# Patient Record
Sex: Female | Born: 1965
Health system: Southern US, Community
[De-identification: ages and names within clinical notes are randomized; demographics above are authoritative.]

## PROBLEM LIST (undated history)

## (undated) DIAGNOSIS — E785 Hyperlipidemia, unspecified: Secondary | ICD-10-CM

## (undated) DIAGNOSIS — Z98811 Dental restoration status: Secondary | ICD-10-CM

## (undated) DIAGNOSIS — M26629 Arthralgia of temporomandibular joint, unspecified side: Secondary | ICD-10-CM

## (undated) DIAGNOSIS — L409 Psoriasis, unspecified: Secondary | ICD-10-CM

## (undated) DIAGNOSIS — Z9889 Other specified postprocedural states: Secondary | ICD-10-CM

## (undated) DIAGNOSIS — M1990 Unspecified osteoarthritis, unspecified site: Secondary | ICD-10-CM

## (undated) DIAGNOSIS — K529 Noninfective gastroenteritis and colitis, unspecified: Secondary | ICD-10-CM

## (undated) DIAGNOSIS — J343 Hypertrophy of nasal turbinates: Secondary | ICD-10-CM

## (undated) DIAGNOSIS — Z800 Family history of malignant neoplasm of digestive organs: Secondary | ICD-10-CM

## (undated) DIAGNOSIS — R112 Nausea with vomiting, unspecified: Secondary | ICD-10-CM

## (undated) DIAGNOSIS — I341 Nonrheumatic mitral (valve) prolapse: Secondary | ICD-10-CM

## (undated) DIAGNOSIS — I4891 Unspecified atrial fibrillation: Secondary | ICD-10-CM

## (undated) DIAGNOSIS — N200 Calculus of kidney: Secondary | ICD-10-CM

## (undated) DIAGNOSIS — G43909 Migraine, unspecified, not intractable, without status migrainosus: Secondary | ICD-10-CM

## (undated) DIAGNOSIS — K219 Gastro-esophageal reflux disease without esophagitis: Secondary | ICD-10-CM

## (undated) DIAGNOSIS — E039 Hypothyroidism, unspecified: Secondary | ICD-10-CM

## (undated) HISTORY — DX: Nonrheumatic mitral (valve) prolapse: I34.1

## (undated) HISTORY — PX: ESOPHAGOGASTRODUODENOSCOPY: SHX1529

## (undated) HISTORY — PX: TRIGEMINAL NERVE DECOMPRESSION: SHX2579

## (undated) HISTORY — PX: TMJ ARTHROPLASTY: SHX1066

## (undated) HISTORY — PX: CHOLECYSTECTOMY: SHX55

## (undated) HISTORY — PX: ABDOMINAL HYSTERECTOMY: SHX81

## (undated) HISTORY — DX: Hyperlipidemia, unspecified: E78.5

## (undated) HISTORY — DX: Calculus of kidney: N20.0

## (undated) HISTORY — DX: Unspecified atrial fibrillation: I48.91

## (undated) HISTORY — PX: COLONOSCOPY: SHX174

## (undated) HISTORY — DX: Family history of malignant neoplasm of digestive organs: Z80.0

## (undated) HISTORY — DX: Gastro-esophageal reflux disease without esophagitis: K21.9

---
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
## 2010-06-19 ENCOUNTER — Encounter
Admission: RE | Admit: 2010-06-19 | Discharge: 2010-06-19 | Payer: Self-pay | Source: Home / Self Care | Attending: Obstetrics and Gynecology | Admitting: Obstetrics and Gynecology

## 2011-05-26 ENCOUNTER — Other Ambulatory Visit: Payer: Self-pay | Admitting: Obstetrics and Gynecology

## 2011-05-26 DIAGNOSIS — Z1231 Encounter for screening mammogram for malignant neoplasm of breast: Secondary | ICD-10-CM

## 2011-05-30 DIAGNOSIS — I341 Nonrheumatic mitral (valve) prolapse: Secondary | ICD-10-CM | POA: Insufficient documentation

## 2011-06-25 ENCOUNTER — Ambulatory Visit: Payer: Self-pay

## 2011-07-16 ENCOUNTER — Ambulatory Visit: Payer: Self-pay

## 2012-10-30 DIAGNOSIS — M67919 Unspecified disorder of synovium and tendon, unspecified shoulder: Secondary | ICD-10-CM | POA: Insufficient documentation

## 2012-10-30 DIAGNOSIS — M25519 Pain in unspecified shoulder: Secondary | ICD-10-CM | POA: Insufficient documentation

## 2012-12-09 ENCOUNTER — Ambulatory Visit: Payer: BC Managed Care – PPO

## 2012-12-09 ENCOUNTER — Ambulatory Visit (INDEPENDENT_AMBULATORY_CARE_PROVIDER_SITE_OTHER): Payer: BC Managed Care – PPO | Admitting: Emergency Medicine

## 2012-12-09 VITALS — BP 138/94 | HR 67 | Temp 97.8°F | Resp 16 | Ht 64.0 in | Wt 128.0 lb

## 2012-12-09 DIAGNOSIS — IMO0002 Reserved for concepts with insufficient information to code with codable children: Secondary | ICD-10-CM

## 2012-12-09 DIAGNOSIS — M79644 Pain in right finger(s): Secondary | ICD-10-CM

## 2012-12-09 DIAGNOSIS — M79609 Pain in unspecified limb: Secondary | ICD-10-CM

## 2012-12-09 NOTE — Progress Notes (Signed)
  Subjective:    Patient ID: Christine Hendrix, female    DOB: [birth year], 47 y.o.   MRN: [id number]  HPI Larey Seat on Sunday 12/05/12.  While tackling dog onto right hand and injured ring finger.  Now finger has changed colored.  Feels likes pins and needles.  When she uses it the darker the color gets.  It is also swollen up into the knuckle.  Cannot bend finger.      Review of Systems     Objective:   Physical Exam There is diffuse ecchymoses involving the right fourth finger she has limited flexion at the PIP joint and DIP joint. There is no instability noted. [facility] reading (PRIMARY) by  Dr. Cleta Alberts there is a volar plate fracture at the base of the middle phalanx of the fourth finger        Assessment & Plan:   The patient will be buddy taped . she is to start exercises in approximately 7-10 days .

## 2012-12-13 ENCOUNTER — Telehealth: Payer: Self-pay

## 2012-12-13 NOTE — Telephone Encounter (Signed)
Patient is calling to ask a question about the way we have taped up her fx finger, and her fingers turning blue please call her at [phone]

## 2012-12-15 NOTE — Telephone Encounter (Signed)
lmom to cb.  Pt needs to probably loosen up the wrap

## 2012-12-15 NOTE — Telephone Encounter (Signed)
She has been removing the buddy wrap on a regular basis. She states the buddy wrap is not too tight when she applies. She states that it turns blue when she uses her hand- the fingers still have a "pins and needles" feeling. She states that it hurts. She acknowledges that she has been using it more than she should. She is going to use regular medical tape instead of the coban. She will call us if she continues to have problems.

## 2012-12-16 NOTE — Telephone Encounter (Signed)
Finger needs to be loosely wrapped.Re Xray next week.

## 2012-12-16 NOTE — Telephone Encounter (Signed)
Called her, advised.

## 2013-01-17 ENCOUNTER — Telehealth: Payer: Self-pay

## 2013-01-17 NOTE — Telephone Encounter (Signed)
Thanks. Request given to xray

## 2013-01-17 NOTE — Telephone Encounter (Signed)
PT HAVE AN APPT WITH ANOTHER DR ON Thursday AND NEED TO COME BY AND PICK UP A COPY OF THE XRAYS SHE HAD . PLEASE CALL [phone] WHEN READY

## 2013-09-02 DIAGNOSIS — G2581 Restless legs syndrome: Secondary | ICD-10-CM | POA: Insufficient documentation

## 2013-09-02 DIAGNOSIS — F341 Dysthymic disorder: Secondary | ICD-10-CM | POA: Insufficient documentation

## 2013-09-02 DIAGNOSIS — E039 Hypothyroidism, unspecified: Secondary | ICD-10-CM | POA: Insufficient documentation

## 2013-09-02 DIAGNOSIS — G4700 Insomnia, unspecified: Secondary | ICD-10-CM | POA: Insufficient documentation

## 2013-09-02 DIAGNOSIS — IMO0001 Reserved for inherently not codable concepts without codable children: Secondary | ICD-10-CM | POA: Insufficient documentation

## 2013-09-02 DIAGNOSIS — M542 Cervicalgia: Secondary | ICD-10-CM | POA: Insufficient documentation

## 2013-09-02 DIAGNOSIS — G4733 Obstructive sleep apnea (adult) (pediatric): Secondary | ICD-10-CM | POA: Insufficient documentation

## 2013-09-02 DIAGNOSIS — K929 Disease of digestive system, unspecified: Secondary | ICD-10-CM | POA: Insufficient documentation

## 2013-09-02 DIAGNOSIS — I499 Cardiac arrhythmia, unspecified: Secondary | ICD-10-CM | POA: Insufficient documentation

## 2013-09-02 DIAGNOSIS — R413 Other amnesia: Secondary | ICD-10-CM | POA: Insufficient documentation

## 2013-10-27 ENCOUNTER — Ambulatory Visit: Payer: BC Managed Care – PPO | Admitting: Podiatry

## 2013-11-21 ENCOUNTER — Encounter: Payer: Self-pay | Admitting: Podiatry

## 2013-11-21 ENCOUNTER — Ambulatory Visit (INDEPENDENT_AMBULATORY_CARE_PROVIDER_SITE_OTHER): Payer: BC Managed Care – PPO

## 2013-11-21 ENCOUNTER — Ambulatory Visit (INDEPENDENT_AMBULATORY_CARE_PROVIDER_SITE_OTHER): Payer: BC Managed Care – PPO | Admitting: Podiatry

## 2013-11-21 VITALS — BP 133/85 | HR 69 | Resp 16 | Ht 64.0 in | Wt 127.0 lb

## 2013-11-21 DIAGNOSIS — M2040 Other hammer toe(s) (acquired), unspecified foot: Secondary | ICD-10-CM

## 2013-11-21 NOTE — Progress Notes (Signed)
Subjective:     Patient ID: Christine Hendrix, female   DOB: 08/03/1965, 48 y.o.   MRN: [id number]  Toe Pain    patient presents stating I do a lot of rotation of the fourth and fifth toes of both my feet and they're starting to bother me when I walk on them and I feel like I am not walking normally   Review of Systems  All other systems reviewed and are negative.      Objective:   Physical Exam  Nursing note and vitals reviewed. Constitutional: She is oriented to person, place, and time.  Cardiovascular: Intact distal pulses.   Musculoskeletal: Normal range of motion.  Neurological: She is oriented to person, place, and time.  Skin: Skin is warm.   Neurovascular status intact with severe rotation of the fourth and fifth toes of both feet with distal lateral keratotic irritative tissue. 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 Patient would like to have these fixed but is not sure and timing and are reviewed distal arthroplasty of the fourth and fifth toes of both feet and all risk associated with this. She will be seen prior to surgery

## 2013-11-21 NOTE — Progress Notes (Signed)
   Subjective:    Patient ID: Christine Hendrix, female    DOB: 12/01/1965, [age] y.o.   MRN: [id number]  HPI Comments: "I have these toes that turn in"  Patient c/o aching 4th and 5th toes bilateral for several years. She feels that she is walking on the toes and they turn inward. Trouble walking.  Toe Pain       Review of Systems  Constitutional: Positive for fatigue.  Cardiovascular: Positive for palpitations.  Endocrine: Positive for heat intolerance.  Musculoskeletal: Positive for arthralgias.  Neurological: Positive for headaches.  All other systems reviewed and are negative.      Objective:   Physical Exam        Assessment & Plan:

## 2013-12-14 ENCOUNTER — Encounter: Payer: Self-pay | Admitting: Podiatry

## 2013-12-14 ENCOUNTER — Ambulatory Visit (INDEPENDENT_AMBULATORY_CARE_PROVIDER_SITE_OTHER): Payer: BC Managed Care – PPO | Admitting: Podiatry

## 2013-12-14 ENCOUNTER — Telehealth: Payer: Self-pay | Admitting: *Deleted

## 2013-12-14 DIAGNOSIS — M2040 Other hammer toe(s) (acquired), unspecified foot: Secondary | ICD-10-CM

## 2013-12-14 DIAGNOSIS — B351 Tinea unguium: Secondary | ICD-10-CM

## 2013-12-14 NOTE — Telephone Encounter (Signed)
Patient had called and stated she had left without a prescription for toenails, please call.  I called and informed the patient that Dr. Charlsie Merlesegal wants her to use Formula 3.  We sale it here at the office.  She stated I live way across town and he failed to tell me this.  I apologized to the patient, told her I was sorry. She stated okay bye!

## 2013-12-15 NOTE — Progress Notes (Signed)
Subjective:     Patient ID: Christine Hendrix, female   DOB: 01/02/1966, 48 y.o.   MRN: [id number]  HPI patient states that the fourth and fifth toes on her right foot have turned yellow after she returned from the beach and she is scared of fungus   Review of Systems     Objective:   Physical Exam Neurovascular status intact with severe rotation of the fourth and fifth toes of both feet with what appears to be traumatized fourth and fifth nails right with possible low grade fungal infection    Assessment:     Probable infection secondary to trauma with possible mycotic infiltration    Plan:     Reviewed condition of the toes is part of this problem and went ahead today and started her on topical antifungal with explaining that we'll take several months to grow out and should not spread. Spread occur she is to let us know immediately

## 2013-12-16 ENCOUNTER — Other Ambulatory Visit: Payer: Self-pay | Admitting: *Deleted

## 2013-12-16 DIAGNOSIS — Z79899 Other long term (current) drug therapy: Secondary | ICD-10-CM

## 2013-12-16 DIAGNOSIS — B351 Tinea unguium: Secondary | ICD-10-CM

## 2013-12-16 NOTE — Progress Notes (Signed)
Patient presents to the office today to pick up Formula 3 for her 4th and 5th toenail right. Patient was concerned that the Formula 3 was not going to be enough to treat. She states that Dr. Charlsie Merlesegal discussed with her that she could try Lamisil. She would like to do this. I gave patient hepatic panel requisition to get bloodwork done and will contact her with results.

## 2014-02-24 DIAGNOSIS — G43711 Chronic migraine without aura, intractable, with status migrainosus: Secondary | ICD-10-CM | POA: Insufficient documentation

## 2014-08-25 ENCOUNTER — Ambulatory Visit (INDEPENDENT_AMBULATORY_CARE_PROVIDER_SITE_OTHER): Payer: BLUE CROSS/BLUE SHIELD | Admitting: Podiatry

## 2014-08-25 ENCOUNTER — Telehealth: Payer: Self-pay | Admitting: *Deleted

## 2014-08-25 ENCOUNTER — Encounter: Payer: Self-pay | Admitting: Podiatry

## 2014-08-25 VITALS — BP 136/90 | HR 73 | Ht 64.0 in | Wt 127.0 lb

## 2014-08-25 DIAGNOSIS — M216X9 Other acquired deformities of unspecified foot: Secondary | ICD-10-CM | POA: Insufficient documentation

## 2014-08-25 DIAGNOSIS — M722 Plantar fascial fibromatosis: Secondary | ICD-10-CM | POA: Diagnosis not present

## 2014-08-25 DIAGNOSIS — M2041 Other hammer toe(s) (acquired), right foot: Secondary | ICD-10-CM | POA: Diagnosis not present

## 2014-08-25 DIAGNOSIS — M21969 Unspecified acquired deformity of unspecified lower leg: Secondary | ICD-10-CM | POA: Insufficient documentation

## 2014-08-25 DIAGNOSIS — M2042 Other hammer toe(s) (acquired), left foot: Secondary | ICD-10-CM

## 2014-08-25 NOTE — Telephone Encounter (Signed)
08/25/2014  Dr. Raynald KempSheard, patient called and stated the arch binders have irritated her ever since she wore them this morning.I told her I would ask you and call her back before the end of the day.

## 2014-08-25 NOTE — Progress Notes (Signed)
Subjective: 49 year old female presents complaining of toes are curling more and afraid of foot surgery.   Objective: Hypermobile first ray bilateral. HAV bilateral. Long and contracted 4th digits bilateral. Painful right heel.  Assessment: Hypermobile first ray with hyperpronation bilateral. 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 Plantar fasciitis right. Ligamentous laxity lower limbs.  Plan: Reviewed clinical findings and available treatment options. Metatarsal binder dispensed x 2. Need custom orthotics. Reviewed Lapidus fusion procedure.

## 2014-08-25 NOTE — Patient Instructions (Signed)
Seen for right heel pain and contracted 4th digits bilateral. Reviewed findings. Noted of weak first ray bilateral. May benefit from Metatarsal binder (d, Custom orthotics, and possible Lapidus fusion procedure (Fusion of the first Metatarso-cuneiform joint).

## 2014-09-11 ENCOUNTER — Ambulatory Visit: Payer: BLUE CROSS/BLUE SHIELD | Admitting: Podiatry

## 2015-05-06 DIAGNOSIS — N3946 Mixed incontinence: Secondary | ICD-10-CM | POA: Insufficient documentation

## 2015-05-06 DIAGNOSIS — R350 Frequency of micturition: Secondary | ICD-10-CM | POA: Insufficient documentation

## 2015-05-06 DIAGNOSIS — R319 Hematuria, unspecified: Secondary | ICD-10-CM | POA: Insufficient documentation

## 2015-05-15 DIAGNOSIS — E278 Other specified disorders of adrenal gland: Secondary | ICD-10-CM | POA: Insufficient documentation

## 2015-06-07 DIAGNOSIS — G43909 Migraine, unspecified, not intractable, without status migrainosus: Secondary | ICD-10-CM | POA: Insufficient documentation

## 2015-06-21 ENCOUNTER — Encounter (HOSPITAL_BASED_OUTPATIENT_CLINIC_OR_DEPARTMENT_OTHER): Payer: Self-pay | Admitting: *Deleted

## 2015-06-21 ENCOUNTER — Emergency Department (HOSPITAL_BASED_OUTPATIENT_CLINIC_OR_DEPARTMENT_OTHER)
Admission: EM | Admit: 2015-06-21 | Discharge: 2015-06-21 | Disposition: A | Discharge status: Home / Self Care | Payer: BLUE CROSS/BLUE SHIELD | Attending: Emergency Medicine | Admitting: Emergency Medicine

## 2015-06-21 ENCOUNTER — Emergency Department (HOSPITAL_BASED_OUTPATIENT_CLINIC_OR_DEPARTMENT_OTHER): Payer: BLUE CROSS/BLUE SHIELD

## 2015-06-21 DIAGNOSIS — Z79899 Other long term (current) drug therapy: Secondary | ICD-10-CM | POA: Insufficient documentation

## 2015-06-21 DIAGNOSIS — E079 Disorder of thyroid, unspecified: Secondary | ICD-10-CM | POA: Insufficient documentation

## 2015-06-21 DIAGNOSIS — Z8679 Personal history of other diseases of the circulatory system: Secondary | ICD-10-CM | POA: Insufficient documentation

## 2015-06-21 DIAGNOSIS — N23 Unspecified renal colic: Secondary | ICD-10-CM | POA: Insufficient documentation

## 2015-06-21 DIAGNOSIS — Z9049 Acquired absence of other specified parts of digestive tract: Secondary | ICD-10-CM | POA: Diagnosis not present

## 2015-06-21 DIAGNOSIS — Z90710 Acquired absence of both cervix and uterus: Secondary | ICD-10-CM | POA: Diagnosis not present

## 2015-06-21 DIAGNOSIS — R1032 Left lower quadrant pain: Secondary | ICD-10-CM | POA: Diagnosis present

## 2015-06-21 LAB — CBC WITH DIFFERENTIAL/PLATELET
BASOS PCT: 0 %
Basophils Absolute: 0 10*3/uL (ref 0.0–0.1)
Eosinophils Absolute: 0 10*3/uL (ref 0.0–0.7)
Eosinophils Relative: 0 %
HEMATOCRIT: 40.2 % (ref 36.0–46.0)
HEMOGLOBIN: 13.2 g/dL (ref 12.0–15.0)
LYMPHS ABS: 3.5 10*3/uL (ref 0.7–4.0)
Lymphocytes Relative: 36 %
MCH: 28.3 pg (ref 26.0–34.0)
MCHC: 32.8 g/dL (ref 30.0–36.0)
MCV: 86.1 fL (ref 78.0–100.0)
MONO ABS: 1 10*3/uL (ref 0.1–1.0)
MONOS PCT: 10 %
NEUTROS ABS: 5.2 10*3/uL (ref 1.7–7.7)
NEUTROS PCT: 54 %
Platelets: 294 10*3/uL (ref 150–400)
RBC: 4.67 MIL/uL (ref 3.87–5.11)
RDW: 13 % (ref 11.5–15.5)
WBC: 9.8 10*3/uL (ref 4.0–10.5)

## 2015-06-21 LAB — URINALYSIS, ROUTINE W REFLEX MICROSCOPIC
Glucose, UA: NEGATIVE mg/dL
KETONES UR: 15 mg/dL — AB
Nitrite: POSITIVE — AB
PROTEIN: 30 mg/dL — AB
Specific Gravity, Urine: 1.031 — ABNORMAL HIGH (ref 1.005–1.030)
pH: 6 (ref 5.0–8.0)

## 2015-06-21 LAB — URINE MICROSCOPIC-ADD ON

## 2015-06-21 LAB — BASIC METABOLIC PANEL
ANION GAP: 13 (ref 5–15)
BUN: 16 mg/dL (ref 6–20)
CALCIUM: 9 mg/dL (ref 8.9–10.3)
CHLORIDE: 104 mmol/L (ref 101–111)
CO2: 23 mmol/L (ref 22–32)
Creatinine, Ser: 1.03 mg/dL — ABNORMAL HIGH (ref 0.44–1.00)
GFR calc Af Amer: >60 mL/min (ref >60)
GFR calc non Af Amer: >60 mL/min (ref >60)
GLUCOSE: 140 mg/dL — AB (ref 65–99)
Potassium: 3.1 mmol/L — ABNORMAL LOW (ref 3.5–5.1)
Sodium: 140 mmol/L (ref 135–145)

## 2015-06-21 MED ORDER — OXYCODONE-ACETAMINOPHEN 5-325 MG PO TABS: 1.0000 | ORAL_TABLET | Freq: Four times a day (QID) | ORAL | Status: DC | PRN

## 2015-06-21 MED ORDER — SODIUM CHLORIDE 0.9 % IV BOLUS (SEPSIS)
1000.0000 mL | Freq: Once | INTRAVENOUS | Status: AC
  Administered 2015-06-21: 1000 mL via INTRAVENOUS

## 2015-06-21 MED ORDER — ONDANSETRON HCL 4 MG/2ML IJ SOLN
4.0000 mg | Freq: Once | INTRAMUSCULAR | Status: AC
  Administered 2015-06-21: 4 mg via INTRAVENOUS
  Filled 2015-06-21: qty 2

## 2015-06-21 MED ORDER — KETOROLAC TROMETHAMINE 15 MG/ML IJ SOLN
15.0000 mg | Freq: Once | INTRAMUSCULAR | Status: AC
  Administered 2015-06-21: 15 mg via INTRAVENOUS
  Filled 2015-06-21: qty 1

## 2015-06-21 MED ORDER — TAMSULOSIN HCL 0.4 MG PO CAPS: 0.4000 mg | ORAL_CAPSULE | Freq: Every day | ORAL | Status: DC

## 2015-06-21 MED ORDER — POTASSIUM CHLORIDE CRYS ER 20 MEQ PO TBCR
20.0000 meq | EXTENDED_RELEASE_TABLET | Freq: Once | ORAL | Status: AC
  Administered 2015-06-21: 20 meq via ORAL
  Filled 2015-06-21: qty 1

## 2015-06-21 MED ORDER — ONDANSETRON HCL 4 MG PO TABS: 4.0000 mg | ORAL_TABLET | Freq: Four times a day (QID) | ORAL | Status: DC

## 2015-06-21 MED FILL — OXYCODONE/APAP 5-325: 5-325 | 3 days supply | Qty: 10 | Fill #0

## 2015-06-21 MED FILL — TAMSULOSIN HCL 0.4 MG CAP: 0.4 | 14 days supply | Qty: 14 | Fill #0

## 2015-06-21 MED FILL — ONDANSETRON HCL 4 MG TABLET: 4 | 3 days supply | Qty: 9 | Fill #0

## 2015-06-21 NOTE — ED Notes (Signed)
Lower abdominal pain, worse on the left side.

## 2015-06-21 NOTE — Discharge Instructions (Signed)
You have a 3mm kidney stone on the left side.     Kidney Stones Kidney stones (urolithiasis) are deposits that form inside your kidneys. The intense pain is caused by the stone moving through the urinary tract. When the stone moves, the ureter goes into spasm around the stone. The stone is usually passed in the urine.  CAUSES   A disorder that makes certain neck glands produce too much parathyroid hormone (primary hyperparathyroidism).  A buildup of uric acid crystals, similar to gout in your joints.  Narrowing (stricture) of the ureter.  A kidney obstruction present at birth (congenital obstruction).  Previous surgery on the kidney or ureters.  Numerous kidney infections. SYMPTOMS   Feeling sick to your stomach (nauseous).  Throwing up (vomiting).  Blood in the urine (hematuria).  Pain that usually spreads (radiates) to the groin.  Frequency or urgency of urination. DIAGNOSIS   Taking a history and physical exam.  Blood or urine tests.  CT scan.  Occasionally, an examination of the inside of the urinary bladder (cystoscopy) is performed. TREATMENT   Observation.  Increasing your fluid intake.  Extracorporeal shock wave lithotripsy--This is a noninvasive procedure that uses shock waves to break up kidney stones.  Surgery may be needed if you have severe pain or persistent obstruction. There are various surgical procedures. Most of the procedures are performed with the use of small instruments. Only small incisions are needed to accommodate these instruments, so recovery time is minimized. The size, location, and chemical composition are all important variables that will determine the proper choice of action for you. Talk to your health care provider to better understand your situation so that you will minimize the risk of injury to yourself and your kidney.  HOME CARE INSTRUCTIONS   Drink enough water and fluids to keep your urine clear or pale yellow. This will help  you to pass the stone or stone fragments.  Strain all urine through the provided strainer. Keep all particulate matter and stones for your health care provider to see. The stone causing the pain may be as small as a grain of salt. It is very important to use the strainer each and every time you pass your urine. The collection of your stone will allow your health care provider to analyze it and verify that a stone has actually passed. The stone analysis will often identify what you can do to reduce the incidence of recurrences.  Only take over-the-counter or prescription medicines for pain, discomfort, or fever as directed by your health care provider.  Keep all follow-up visits as told by your health care provider. This is important.  Get follow-up X-rays if required. The absence of pain does not always mean that the stone has passed. It may have only stopped moving. If the urine remains completely obstructed, it can cause loss of kidney function or even complete destruction of the kidney. It is your responsibility to make sure X-rays and follow-ups are completed. Ultrasounds of the kidney can show blockages and the status of the kidney. Ultrasounds are not associated with any radiation and can be performed easily in a matter of minutes.  Make changes to your daily diet as told by your health care provider. You may be told to:  Limit the amount of salt that you eat.  Eat 5 or more servings of fruits and vegetables each day.  Limit the amount of meat, poultry, fish, and eggs that you eat.  Collect a 24-hour urine sample as told  by your health care provider.You may need to collect another urine sample every 6-12 months. SEEK MEDICAL CARE IF:  You experience pain that is progressive and unresponsive to any pain medicine you have been prescribed. SEEK IMMEDIATE MEDICAL CARE IF:   Pain cannot be controlled with the prescribed medicine.  You have a fever or shaking chills.  The severity or  intensity of pain increases over 18 hours and is not relieved by pain medicine.  You develop a new onset of abdominal pain.  You feel faint or pass out.  You are unable to urinate.   This information is not intended to replace advice given to you by your health care provider. Make sure you discuss any questions you have with your health care provider.   Document Released: 05/12/2005 Document Revised: 01/31/2015 Document Reviewed: 10/13/2012 Elsevier Interactive Patient Education [employer]

## 2015-06-21 NOTE — ED Notes (Signed)
Patient transported to and from radiology department via stretcher. 

## 2015-06-21 NOTE — ED Provider Notes (Signed)
CSN: [id number]     Arrival date & time 06/21/15  1245 History   First MD Initiated Contact with Patient 06/21/15 1300     Chief Complaint  Patient presents with  . Abdominal Pain     Patient is a 50 y.o. female presenting with abdominal pain. The history is provided by the patient. No language interpreter was used.  Abdominal Pain  Christine Hendrix is a 50 y.o. female who presents to the Emergency Department complaining of abdominal pain.  He developed left lower quadrant abdominal pain starting yesterday. Pain is waxing and waning. It radiates down her groin at times. She has associated urgency and sensation that she needs to urinate but cannot. She denies any fevers but she does have significant nausea, no vomiting. She had a CT scan of her abdomen performed in December that showed a small cyst on her kidneys, kidney stone.  She took azo last night.    Past Medical History  Diagnosis Date  . Thyroid disease   . MVP (mitral valve prolapse)    Past Surgical History  Procedure Laterality Date  . Cholecystectomy    . Abdominal hysterectomy    . Mandible surgery    . Trigeminal nerve surgery     Family History  Problem Relation Age of Onset  . Heart disease Mother   . Heart disease Father    Social History  Substance Use Topics  . Smoking status: Never Smoker   . Smokeless tobacco: Never Used  . Alcohol Use: None   OB History    No data available     Review of Systems  Gastrointestinal: Positive for abdominal pain.  All other systems reviewed and are negative.     Allergies  Codeine  Home Medications   Prior to Admission medications   Medication Sig Start Date End Date Taking? Authorizing Provider  estrogens, conjugated, (PREMARIN) 0.9 MG tablet Take 0.9 mg by mouth daily. Take daily for 21 days then do not take for 7 days.    Historical Provider, MD  levothyroxine (SYNTHROID, LEVOTHROID) 75 MCG tablet Take 75 mcg by mouth daily before breakfast.    Historical  Provider, MD  ondansetron (ZOFRAN) 4 MG tablet Take 1 tablet (4 mg total) by mouth every 6 (six) hours. 06/21/15   Tilden Fossa, MD  oxyCODONE-acetaminophen (PERCOCET/ROXICET) 5-325 MG tablet Take 1 tablet by mouth every 6 (six) hours as needed for severe pain. 06/21/15   Tilden Fossa, MD  tamsulosin (FLOMAX) 0.4 MG CAPS capsule Take 1 capsule (0.4 mg total) by mouth daily. 06/21/15   Tilden Fossa, MD   BP 143/87 mmHg  Pulse 68  Temp(Src) 98.1 F (36.7 C) (Oral)  Resp 18  Ht  (1.626 m)  Wt 127 lb (57.607 kg)  BMI 21.79 kg/m2  SpO2 100% Physical Exam  Constitutional: She is oriented to person, place, and time. She appears well-developed and well-nourished. She appears distressed.  HENT:  Head: Normocephalic and atraumatic.  Cardiovascular: Normal rate and regular rhythm.   No murmur heard. Pulmonary/Chest: Effort normal and breath sounds normal. No respiratory distress.  Abdominal: Soft. There is no rebound and no guarding.  Mild LLQ tenderness  Musculoskeletal: She exhibits no edema or tenderness.  Neurological: She is alert and oriented to person, place, and time.  Skin: Skin is warm and dry.  Psychiatric: She has a normal mood and affect. Her behavior is normal.  Nursing note and vitals reviewed.   ED Course  Procedures (including critical care time)  Labs Review Labs Reviewed  URINALYSIS, ROUTINE W REFLEX MICROSCOPIC (NOT AT [facility]) - Abnormal; Notable for the following:    Color, Urine ORANGE (*)    APPearance CLOUDY (*)    Specific Gravity, Urine 1.031 (*)    Hgb urine dipstick LARGE (*)    Bilirubin Urine SMALL (*)    Ketones, ur 15 (*)    Protein, ur 30 (*)    Nitrite POSITIVE (*)    Leukocytes, UA SMALL (*)    All other components within normal limits  URINE MICROSCOPIC-ADD ON - Abnormal; Notable for the following:    Squamous Epithelial / LPF 0-5 (*)    Bacteria, UA RARE (*)    All other components within normal limits  BASIC METABOLIC PANEL - Abnormal;  Notable for the following:    Potassium 3.1 (*)    Glucose, Bld 140 (*)    Creatinine, Ser 1.03 (*)    All other components within normal limits  URINE CULTURE  CBC WITH DIFFERENTIAL/PLATELET    Imaging Review Ct Renal Stone Study  06/21/2015  CLINICAL DATA:  Acute left-sided abdominal pain. EXAM: CT ABDOMEN AND PELVIS WITHOUT CONTRAST TECHNIQUE: Multidetector CT imaging of the abdomen and pelvis was performed following the standard protocol without IV contrast. COMPARISON:  CT scan of April 06, 2015. FINDINGS: Visualized lung bases are unremarkable. No significant osseous abnormality is noted. Status post cholecystectomy. No focal abnormality is noted in the liver, spleen or pancreas on these unenhanced images. Stable left adrenal nodule is noted. Right adrenal gland appears normal. Minimal bilateral nephrolithiasis is noted. Right kidney and ureter appear normal. Mild left hydroureteronephrosis with perinephric stranding is noted secondary to 3 mm calculus at the left ureterovesical junction. There is no evidence of bowel obstruction. No abnormal fluid collection is noted. No significant adenopathy is noted. Urinary bladder appears normal. Status post hysterectomy. Ovaries are not well visualized. IMPRESSION: Stable left adrenal nodule. Minimal bilateral nephrolithiasis. Mild left hydronephrosis is noted with perinephric stranding secondary to 3 mm left ureterovesical junction calculus. Electronically Signed   By: Lupita Raider, M.D.   On: 06/21/2015 15:26   I have personally reviewed and evaluated these images and lab results as part of my medical decision-making.   EKG Interpretation None      MDM   Final diagnoses:  Renal colic on left side    Pt here for evaluation of LLQ abdominal pain.  Pain is significantly improved on repeat evaluation.  CT scan shows 3mm UVJ stone.  UA not c/w UTI - pt took azo, cultures sent.  D/w pt home care for renal colic as well as outpatient follow up  and close return precautions.      Tilden Fossa, MD 06/21/15 1630

## 2015-06-22 LAB — URINE CULTURE

## 2015-09-20 DIAGNOSIS — R300 Dysuria: Secondary | ICD-10-CM | POA: Insufficient documentation

## 2016-02-11 ENCOUNTER — Ambulatory Visit (INDEPENDENT_AMBULATORY_CARE_PROVIDER_SITE_OTHER): Payer: BLUE CROSS/BLUE SHIELD

## 2016-02-11 ENCOUNTER — Ambulatory Visit (INDEPENDENT_AMBULATORY_CARE_PROVIDER_SITE_OTHER): Payer: BLUE CROSS/BLUE SHIELD | Admitting: Podiatry

## 2016-02-11 ENCOUNTER — Encounter: Payer: Self-pay | Admitting: Podiatry

## 2016-02-11 DIAGNOSIS — M79671 Pain in right foot: Secondary | ICD-10-CM

## 2016-02-11 DIAGNOSIS — M722 Plantar fascial fibromatosis: Secondary | ICD-10-CM | POA: Diagnosis not present

## 2016-02-11 DIAGNOSIS — M2041 Other hammer toe(s) (acquired), right foot: Secondary | ICD-10-CM | POA: Diagnosis not present

## 2016-02-11 DIAGNOSIS — M2042 Other hammer toe(s) (acquired), left foot: Secondary | ICD-10-CM

## 2016-02-11 DIAGNOSIS — M21619 Bunion of unspecified foot: Secondary | ICD-10-CM | POA: Diagnosis not present

## 2016-02-11 DIAGNOSIS — M79672 Pain in left foot: Secondary | ICD-10-CM | POA: Diagnosis not present

## 2016-02-11 NOTE — Patient Instructions (Signed)

## 2016-02-12 ENCOUNTER — Telehealth: Payer: Self-pay | Admitting: *Deleted

## 2016-02-12 NOTE — Progress Notes (Signed)
Subjective:     Patient ID: Christine Hendrix, female   DOB: 07/26/1965, 50 y.o.   MRN: [id number]  HPI patient presents with painful hammertoe deformities the fourth and fifth digits of both feet and states that she is developing a structural bunion on her left which is gradually becoming more symptomatic. States that she's been trying wider shoes she's tried padding trimming without relief and they're gradually becoming more of an issue for her and she wants to get them fixed   Review of Systems  All other systems reviewed and are negative.      Objective:   Physical Exam  Constitutional: She is oriented to person, place, and time.  Cardiovascular: Intact distal pulses.   Musculoskeletal: Normal range of motion.  Neurological: She is oriented to person, place, and time.  Skin: Skin is warm.  Nursing note and vitals reviewed.  neurovascular status intact muscle strength adequate range of motion within normal limits with patient noted to have distal keratotic lesion digits 4 bilateral with proximal rotation of the fifth digits and hyperostosis medial aspect first metatarsal head left with a shoe and around the metatarsal. 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 Patient has been trying conservative care without relief over the last several years    Plan:     H&P conditions reviewed and recommended correction. I explained the procedures and the fact that there is no guarantee will but up and fourth toes and extremely good alignment but I do think long-term is the best chance that she has for correction. Patient wants surgery and also once the bunion fixed left and since she's been thinking about this for the last several years she wants  to go over consent form today. I allowed her to read consent form going over all possible complications as outlined in the consent form and all possible alternative treatments. Patient understands recovery can take 6 months to one year for this type of the procedure and I did dispense air fracture walker for the left and she is going to have Austin-type osteotomy left along with distal hammertoe fourth bilateral and proximal hammertoe fifth bilateral. At this point she is encouraged to call with any questions prior to procedure  X-ray report indicates there is severe rotation distal fourth digit bilateral with rotation fifth digits bilateral and moderate structural bunion deformity left over right

## 2016-02-12 NOTE — Telephone Encounter (Signed)
"  I was told to call you to schedule my surgery."  When would you like to schedule?  "I would like to do it on October 10." That date is available.  You will receive a call from someone from the surgical center the Friday or Monday before surgery date with the arrival time.  You can go ahead and register with the surgical center.  "Okay, thanks so much."

## 2016-02-13 ENCOUNTER — Telehealth: Payer: Self-pay | Admitting: *Deleted

## 2016-02-13 NOTE — Telephone Encounter (Signed)
"  I had a quick question about the surgical procedure I signed up for today.  If you could call me back."

## 2016-02-15 ENCOUNTER — Telehealth: Payer: Self-pay | Admitting: *Deleted

## 2016-02-15 NOTE — Telephone Encounter (Addendum)
Pt states she is scheduled 03/04/2016 for surgery with Dr. Charlsie Merlesegal and has questions. 02/25/2016-Pt called asked if needed an appt with anesthesia for this procedure, it was mentioned in the pre-op surgery instructions.  I told pt she would probably get an call from the surgery center end of the week, but this surgery would use IV sedation only. Pt asked when she would go back to work. I told her depending on the surgery, how she was recovering, and her job requirements, Dr. Beverlee Nimsegal's may release to work after having the 1st POV. Pt asked if she would have pins in her toes. I told her I could not see her consent form to see her procedures, but it would have indicated on it.

## 2016-02-20 NOTE — Telephone Encounter (Signed)
I am returning your call.  How can I help you?  "I had several questions but I'm coming in to see him on tomorrow so I can ask him then.  I do have a question for you regarding my insurance.  Has my surgery been authorized yet?"  You have [employer] and they do not require prior authorization.  "They do not, that is interesting.  "What procedures am I having?"  You are having an Austin Bunionectomy left, Hammer Toe Repair 4th distal and 5th bilateral and Injection Heel bilateral.  "Will I have a wire in my foot?"  You may have a wire or a screw.  "Okay thank you, I will ask him the other questions when I see him."

## 2016-02-21 ENCOUNTER — Encounter: Payer: Self-pay | Admitting: Podiatry

## 2016-02-21 ENCOUNTER — Ambulatory Visit (INDEPENDENT_AMBULATORY_CARE_PROVIDER_SITE_OTHER): Payer: BLUE CROSS/BLUE SHIELD | Admitting: Podiatry

## 2016-02-21 DIAGNOSIS — M2041 Other hammer toe(s) (acquired), right foot: Secondary | ICD-10-CM

## 2016-02-21 DIAGNOSIS — M2042 Other hammer toe(s) (acquired), left foot: Secondary | ICD-10-CM | POA: Diagnosis not present

## 2016-02-21 DIAGNOSIS — M21619 Bunion of unspecified foot: Secondary | ICD-10-CM

## 2016-02-21 NOTE — Progress Notes (Signed)
Subjective:     Patient ID: Christine Hendrix, female   DOB: 12/29/1965, 50 y.o.   MRN: [id number]  HPI patient presents with numerous questions concerning correction of both feet and wanted to review   Review of Systems     Objective:   Physical Exam Neurovascular status intact with structural bunion deformity left and digital deformity bilateral    Assessment:     Structural deformity bilateral    Plan:     Reviewed condition and recommended surgery and spent a great of time going over all questions that she had concerning surgery and answered them to the best of my ability. She understands total recovery from procedure such as this can be a proximally 6 months

## 2016-03-04 ENCOUNTER — Encounter: Payer: Self-pay | Admitting: Podiatry

## 2016-03-04 DIAGNOSIS — M2042 Other hammer toe(s) (acquired), left foot: Secondary | ICD-10-CM | POA: Diagnosis not present

## 2016-03-04 DIAGNOSIS — M7732 Calcaneal spur, left foot: Secondary | ICD-10-CM | POA: Diagnosis not present

## 2016-03-04 DIAGNOSIS — M722 Plantar fascial fibromatosis: Secondary | ICD-10-CM | POA: Diagnosis not present

## 2016-03-04 DIAGNOSIS — M2012 Hallux valgus (acquired), left foot: Secondary | ICD-10-CM | POA: Diagnosis not present

## 2016-03-04 HISTORY — PX: BUNIONECTOMY WITH HAMMERTOE RECONSTRUCTION: SHX5600

## 2016-03-05 ENCOUNTER — Telehealth: Payer: Self-pay | Admitting: *Deleted

## 2016-03-05 NOTE — Telephone Encounter (Addendum)
Pt states she had surgery 03/04/2016 and when she places the ice pack on the foot she has throbbing. Left message for pt to remove the surgery boot, open-ended sock, ace wrap, and elevate the foot for 15 minutes, then place the foot even with her hip and rewrap the area looser, but if the throbbing worsens elevated then dangle for 15 minutes, then place the foot level with her hip and rewrap ace looser, reapply the sock and boot. Call with concerns. 03/06/2016-Pt asked if the whole foot was suppose to hurt, and I told her yes. Pt states she is only taking the Motrin 800mg  because the Demerol made her sick this morning. I told pt to take the Phenergan about 30 minutes before the Demerol, take the demerol with light food, and take the Motrin 800mg  between dosing of the Demerol. Pt states she is keeping the foot on 3 pillows and using her ftr's knee scooter. I told pt she could weight bear 15 mins/hour in the surgery boot, this would help with healing as long as was not up on the foot an extended amount of time. Pt states understanding. 03/14/2016-Pt asked how long she needed to keep the foot elevated, or be up on the foot. I told pt she could increase her weight bearing by 10 mins/hour, ice for comfort. 04/10/2016-Pt states her 5th toe looks great, and yesterday when viewing the new x-rays she noticed the 4th toe is curling in, and now she has noticed the the 4th toe does look like it is curling in. Pt asked is there anything she can do or wear to keep this from happening. 04/11/2016-I informed pt of Dr. Beverlee Nimsegal's statement that the 4th toe should be okay.I told pt that the swelling would gradually and decrease the size of the toe, changing how it looked and felt. Pt states she is now feeling herself walk on it, and doesn't want it to heal that way. 11/14/2016-Pt states Dr. Charlsie Merlesegal has given her antiinflammatory medication for the swelling in her feet and she has swelling again on and off, and wanted to know if Dr. Charlsie Merlesegal  would refill. Dr. Everlena Cooperegal okayed the refill as previously and encouraged pt to make an appt if swelling continues. I informed pt of Dr. Beverlee Nimsegal's orders.

## 2016-03-07 ENCOUNTER — Telehealth: Payer: Self-pay | Admitting: *Deleted

## 2016-03-07 NOTE — Telephone Encounter (Signed)
"  Dr. Charlsie Merlesegal did surgery on my left foot today.  He was supposed to do both feet but I changed my mind and only did the left foot.  I talked to him about doing the right foot in two weeks on the 24th.  Dr. Charlsie Merlesegal said he could fit me in.  So, I'm calling to see if you can fit me in on the 24th for my right foot.  Call me back, I'd appreciate it.  Thank you so much."  I am returning your call.  Unfortunately Dr. Charlsie Merlesegal doesn't have any time available on October 24 for your surgery.  His schedule is full.  "That's not what he told me.  He said I would be able to do it."  I am sorry but that is not possible.  "So when will he be able to do it?"  His next available time is November 7.  "That's not possible, I only had 2 weeks to be out of work to do this."  I am sorry, he cannot do it that day.  "I guess I won't be having surgery on my left foot then!"  I am sorry.  "Who am I speaking with?"  My name is Christine Hendrix and I am the surgical coordinator.  (Patient ended the call.)

## 2016-03-12 ENCOUNTER — Other Ambulatory Visit (INDEPENDENT_AMBULATORY_CARE_PROVIDER_SITE_OTHER): Payer: Self-pay | Admitting: Otolaryngology

## 2016-03-12 DIAGNOSIS — J0101 Acute recurrent maxillary sinusitis: Secondary | ICD-10-CM

## 2016-03-13 ENCOUNTER — Ambulatory Visit
Admission: RE | Admit: 2016-03-13 | Discharge: 2016-03-13 | Disposition: A | Discharge status: Home / Self Care | Payer: BLUE CROSS/BLUE SHIELD | Source: Ambulatory Visit | Attending: Otolaryngology | Admitting: Otolaryngology

## 2016-03-13 DIAGNOSIS — J0101 Acute recurrent maxillary sinusitis: Secondary | ICD-10-CM

## 2016-03-14 ENCOUNTER — Ambulatory Visit (INDEPENDENT_AMBULATORY_CARE_PROVIDER_SITE_OTHER): Payer: BLUE CROSS/BLUE SHIELD | Admitting: Podiatry

## 2016-03-14 ENCOUNTER — Ambulatory Visit (INDEPENDENT_AMBULATORY_CARE_PROVIDER_SITE_OTHER): Payer: BLUE CROSS/BLUE SHIELD

## 2016-03-14 VITALS — BP 152/105 | Temp 97.9°F

## 2016-03-14 DIAGNOSIS — M2041 Other hammer toe(s) (acquired), right foot: Secondary | ICD-10-CM | POA: Diagnosis not present

## 2016-03-14 DIAGNOSIS — Z9889 Other specified postprocedural states: Secondary | ICD-10-CM

## 2016-03-14 DIAGNOSIS — M21619 Bunion of unspecified foot: Secondary | ICD-10-CM | POA: Diagnosis not present

## 2016-03-14 DIAGNOSIS — M2042 Other hammer toe(s) (acquired), left foot: Secondary | ICD-10-CM | POA: Diagnosis not present

## 2016-03-14 MED ORDER — ONDANSETRON HCL 4 MG PO TABS: 4.0000 mg | ORAL_TABLET | Freq: Four times a day (QID) | ORAL | 0 refills | Status: DC

## 2016-03-19 ENCOUNTER — Encounter: Payer: Self-pay | Admitting: Podiatry

## 2016-03-19 ENCOUNTER — Ambulatory Visit (INDEPENDENT_AMBULATORY_CARE_PROVIDER_SITE_OTHER): Payer: BLUE CROSS/BLUE SHIELD | Admitting: Podiatry

## 2016-03-19 DIAGNOSIS — M79672 Pain in left foot: Secondary | ICD-10-CM

## 2016-03-19 DIAGNOSIS — M21612 Bunion of left foot: Secondary | ICD-10-CM | POA: Diagnosis not present

## 2016-03-19 DIAGNOSIS — M2042 Other hammer toe(s) (acquired), left foot: Secondary | ICD-10-CM

## 2016-03-19 DIAGNOSIS — M21619 Bunion of unspecified foot: Secondary | ICD-10-CM

## 2016-03-19 DIAGNOSIS — Z9889 Other specified postprocedural states: Secondary | ICD-10-CM

## 2016-03-20 NOTE — Progress Notes (Signed)
Subjective:     Patient ID: Christine Hendrix, female   DOB: 06/16/1965, 50 y.o.   MRN: [id number]  HPI patient presents stating she's not having a lot of pain in her left foot except for her little toe and overall she's very pleased with the result but was concerned about the position of the little toe   Review of Systems     Objective:   Physical Exam Neurovascular status intact muscle strength adequate with patient's left fifth digit actually looking normal for this. Postop with a little bit of flexibility to the toe secondary to bone structure removal. All other incisions are healing well and the first MPJ range of motion is good left    Assessment:     Doing well post forefoot surgery left    Plan:     Explained to patient that it's normal to have some flexibility in the fifth toe and it should over time be in excellent position. Stitches removed wound edges coapted well and I did reapply dressing and instructed on keeping the fifth toe over against the fourth toe to hold it in position and showed her how to do this

## 2016-03-21 NOTE — Progress Notes (Signed)
Subjective: Christine Hendrix is a 50 y.o. is seen today in office s/p left HAV , 4th and  5th digit repair preformed on 03/04/16. They state their pain is controlled but she is having some pain to the 5th toe. She's been continuing with offloading boot. Denies any systemic complaints such as fevers, chills, nausea, vomiting. No calf pain, chest pain, shortness of breath.   Objective: General: No acute distress, AAOx3  DP/PT pulses palpable 2/4, CRT < 3 sec to all digits.  Protective sensation intact. Motor function intact.  Right foot: Incision is well coapted without any evidence of dehiscence and sutures intact. There is no surrounding erythema, ascending cellulitis, fluctuance, crepitus, malodor, drainage/purulence. There is mild edema around the surgical site. There is pain along the surgical site to the fifth toe however the remainder the incision so with minimal pain. The fifth digit does appear to be a somewhat abducted position.  No other areas of tenderness to bilateral lower extremities.  No other open lesions or pre-ulcerative lesions.  No pain with calf compression, swelling, warmth, erythema.   Assessment and Plan:  Status post left foot surgery, doing well with no complications   -Treatment options discussed including all alternatives, risks, and complications -X-rays obtained and reviewed. Fifth digit appears to be slightly abducted. Antibiotic ointment and a Band-Aid was applied to the incisions and the 5th toe was splinted and better position. -Continue offloading boot. -Ice/elevation -Pain medication as needed. -Monitor for any clinical signs or symptoms of infection and DVT/PE and directed to call the office immediately should any occur or go to the ER. -Follow-up as scheduled or sooner if any problems arise. In the meantime, encouraged to call the office with any questions, concerns, change in symptoms.   Ovid CurdMatthew Towanda Hornstein, DPM

## 2016-03-21 NOTE — Progress Notes (Signed)
DOS 10.10.2017 [place] Bunionectomy (cutting and moving bone) with Pin Fixation Left; Arthroplasty Distal 4th, Prox 5th B/L Toes; Injection Heel B/L

## 2016-04-09 ENCOUNTER — Encounter: Payer: Self-pay | Admitting: Podiatry

## 2016-04-09 ENCOUNTER — Ambulatory Visit (INDEPENDENT_AMBULATORY_CARE_PROVIDER_SITE_OTHER): Payer: BLUE CROSS/BLUE SHIELD | Admitting: Podiatry

## 2016-04-09 ENCOUNTER — Ambulatory Visit (INDEPENDENT_AMBULATORY_CARE_PROVIDER_SITE_OTHER): Payer: BLUE CROSS/BLUE SHIELD

## 2016-04-09 DIAGNOSIS — M2041 Other hammer toe(s) (acquired), right foot: Secondary | ICD-10-CM

## 2016-04-09 DIAGNOSIS — M21619 Bunion of unspecified foot: Secondary | ICD-10-CM

## 2016-04-09 DIAGNOSIS — M2042 Other hammer toe(s) (acquired), left foot: Secondary | ICD-10-CM | POA: Diagnosis not present

## 2016-04-09 DIAGNOSIS — Z9889 Other specified postprocedural states: Secondary | ICD-10-CM

## 2016-04-10 NOTE — Progress Notes (Signed)
Subjective:     Patient ID: Christine Hendrix, female   DOB: 11/29/1965, 50 y.o.   MRN: [id number]  HPI patient states she's having minimal pain in her left foot except for the fifth digit which is mildly sore but she's very pleased with the results and her first metatarsal is functioning very well   Review of Systems     Objective:   Physical Exam Neurovascular status intact negative Homans sign was noted with well healed surgical sites left with wound edges well coapted good alignment and continued strengthen the fifth digit developing. There is minimal discomfort when pressed and good range of motion first MPJ    Assessment:     Doing well recovery of foot surgery left    Plan:     H&P x-rays reviewed stitches removed wound edges coapted well and applied anklet with instructions on continued compression elevation and immobilization. Patient be seen back in 3 weeks or earlier if needed  X-rays indicate that there is good healing of the osteotomy pin in place no signs of movement with joint congruence and digit seem to be in good alignment

## 2016-04-11 ENCOUNTER — Ambulatory Visit: Payer: BLUE CROSS/BLUE SHIELD

## 2016-04-11 NOTE — Telephone Encounter (Signed)
No should be ok

## 2016-04-11 NOTE — Telephone Encounter (Signed)
Can feel weird as it heals

## 2016-04-15 ENCOUNTER — Telehealth: Payer: Self-pay

## 2016-04-15 NOTE — Telephone Encounter (Signed)
Pt called with concern about the placement of her 4th toe, post operatively, she states that is appears that the 4th toe is starting to move underneath her other toe and this shift concerns her. I told her that we would try a toe splint and she is to wear that as much as she can to help hold her toe in place as it continues to heal. I also advised her to make an appt with Dr Charlsie Merlesegal to follow up with concerns before regular scheduled post op. She agreed and will come pick up darco splint.

## 2016-04-16 ENCOUNTER — Telehealth: Payer: Self-pay

## 2016-04-16 NOTE — Telephone Encounter (Signed)
Patient came to the office on 04/15/16 to discuss concerns about her 4th toe placement, post operativley. We discussed post op healing,  We discussed her xrays and she did note an improvement in 4th toe placement from before surgery but she was still concerned and worried that her 4th toe was shifting under her 3rd toe. I showed her how to lightly tape her 4th and 5th toes together with coban to help pull the 4th toe straight as is continues to heal, advised her to not tape toes too tightly and if there is any pain with taping she is to stop.. She then made an appt to come talk with Dr Charlsie Merlesegal about her concerns next week.

## 2016-04-25 ENCOUNTER — Ambulatory Visit (INDEPENDENT_AMBULATORY_CARE_PROVIDER_SITE_OTHER): Payer: BLUE CROSS/BLUE SHIELD

## 2016-04-25 ENCOUNTER — Ambulatory Visit (INDEPENDENT_AMBULATORY_CARE_PROVIDER_SITE_OTHER): Payer: BLUE CROSS/BLUE SHIELD | Admitting: Podiatry

## 2016-04-25 DIAGNOSIS — M21619 Bunion of unspecified foot: Secondary | ICD-10-CM

## 2016-04-25 DIAGNOSIS — M2041 Other hammer toe(s) (acquired), right foot: Secondary | ICD-10-CM

## 2016-04-25 DIAGNOSIS — M2042 Other hammer toe(s) (acquired), left foot: Secondary | ICD-10-CM

## 2016-04-25 DIAGNOSIS — Z9889 Other specified postprocedural states: Secondary | ICD-10-CM

## 2016-04-25 DIAGNOSIS — J343 Hypertrophy of nasal turbinates: Secondary | ICD-10-CM

## 2016-04-25 HISTORY — DX: Hypertrophy of nasal turbinates: J34.3

## 2016-04-27 NOTE — Progress Notes (Signed)
Subjective:     Patient ID: Christine Hendrix, female   DOB: 01/13/1966, 50 y.o.   MRN: [id number]  HPI patient presents stating I'm doing well but I'm just concerned because my fourth toe seems to be slightly rotated over where it was before. States that it still better than before surgery but she wanted to make sure there is no problems   Review of Systems     Objective:   Physical Exam Neurovascular status intact negative Homan sign was noted with well-healed surgical sites fourth and fifth digit left and first MPJ left with good range of motion and no current pain when palpated    Assessment:     Mild irritation fourth digit left which is relatively normal but significantly improved from preoperative    Plan:     H&P x-rays reviewed and I explained that these toes Candy rotated slightly of that the initial x-ray that she's comparing it to did have sterile dressings which were holding the toe in an artificial position. She is no longer walking on the edge of the toe but is walking on the bottom that was my chief goal in this particular procedure. It should heal uneventfully hear and I do not believe it'll move further than wear it is and even though she expresses deep concerned I tried to make her comfortable that this will give her a good long-term result  X-rays indicate good structural alignment with no signs of significant pathology

## 2016-05-07 ENCOUNTER — Encounter: Payer: Self-pay | Admitting: Podiatry

## 2016-05-07 NOTE — Progress Notes (Signed)
Released medical records to patient per patient's request. JMS

## 2016-05-08 ENCOUNTER — Ambulatory Visit: Payer: BLUE CROSS/BLUE SHIELD | Admitting: Podiatry

## 2016-05-20 ENCOUNTER — Encounter (HOSPITAL_BASED_OUTPATIENT_CLINIC_OR_DEPARTMENT_OTHER): Payer: Self-pay | Admitting: *Deleted

## 2016-05-22 ENCOUNTER — Other Ambulatory Visit: Payer: Self-pay | Admitting: Otolaryngology

## 2016-05-23 ENCOUNTER — Encounter (HOSPITAL_BASED_OUTPATIENT_CLINIC_OR_DEPARTMENT_OTHER)
Admission: RE | Disposition: A | Discharge status: Home / Self Care | Payer: Self-pay | Source: Ambulatory Visit | Attending: Otolaryngology

## 2016-05-23 ENCOUNTER — Encounter (HOSPITAL_BASED_OUTPATIENT_CLINIC_OR_DEPARTMENT_OTHER): Payer: Self-pay | Admitting: *Deleted

## 2016-05-23 ENCOUNTER — Ambulatory Visit (HOSPITAL_BASED_OUTPATIENT_CLINIC_OR_DEPARTMENT_OTHER)
Admission: RE | Admit: 2016-05-23 | Discharge: 2016-05-23 | Disposition: A | Discharge status: Home / Self Care | Payer: BLUE CROSS/BLUE SHIELD | Source: Ambulatory Visit | Attending: Otolaryngology | Admitting: Otolaryngology

## 2016-05-23 ENCOUNTER — Ambulatory Visit (HOSPITAL_BASED_OUTPATIENT_CLINIC_OR_DEPARTMENT_OTHER): Payer: BLUE CROSS/BLUE SHIELD | Admitting: Anesthesiology

## 2016-05-23 DIAGNOSIS — J342 Deviated nasal septum: Secondary | ICD-10-CM | POA: Insufficient documentation

## 2016-05-23 DIAGNOSIS — J343 Hypertrophy of nasal turbinates: Secondary | ICD-10-CM | POA: Diagnosis not present

## 2016-05-23 DIAGNOSIS — Z881 Allergy status to other antibiotic agents status: Secondary | ICD-10-CM | POA: Diagnosis not present

## 2016-05-23 DIAGNOSIS — Z888 Allergy status to other drugs, medicaments and biological substances status: Secondary | ICD-10-CM | POA: Insufficient documentation

## 2016-05-23 DIAGNOSIS — M1990 Unspecified osteoarthritis, unspecified site: Secondary | ICD-10-CM | POA: Diagnosis not present

## 2016-05-23 DIAGNOSIS — Z885 Allergy status to narcotic agent status: Secondary | ICD-10-CM | POA: Insufficient documentation

## 2016-05-23 DIAGNOSIS — E039 Hypothyroidism, unspecified: Secondary | ICD-10-CM | POA: Diagnosis not present

## 2016-05-23 DIAGNOSIS — J3489 Other specified disorders of nose and nasal sinuses: Secondary | ICD-10-CM | POA: Diagnosis not present

## 2016-05-23 HISTORY — DX: Hypertrophy of nasal turbinates: J34.3

## 2016-05-23 HISTORY — DX: Other specified postprocedural states: Z98.890

## 2016-05-23 HISTORY — DX: Dental restoration status: Z98.811

## 2016-05-23 HISTORY — DX: Other specified postprocedural states: R11.2

## 2016-05-23 HISTORY — DX: Arthralgia of temporomandibular joint, unspecified side: M26.629

## 2016-05-23 HISTORY — DX: Psoriasis, unspecified: L40.9

## 2016-05-23 HISTORY — DX: Noninfective gastroenteritis and colitis, unspecified: K52.9

## 2016-05-23 HISTORY — DX: Hypothyroidism, unspecified: E03.9

## 2016-05-23 HISTORY — DX: Unspecified osteoarthritis, unspecified site: M19.90

## 2016-05-23 HISTORY — DX: Migraine, unspecified, not intractable, without status migrainosus: G43.909

## 2016-05-23 HISTORY — PX: TURBINATE REDUCTION: SHX6157

## 2016-05-23 SURGERY — REDUCTION, NASAL TURBINATE
Anesthesia: General | Laterality: Bilateral

## 2016-05-23 MED ORDER — SUCCINYLCHOLINE CHLORIDE 200 MG/10ML IV SOSY
PREFILLED_SYRINGE | INTRAVENOUS | Status: AC
Start: 2016-05-23 — End: 2016-05-23
  Filled 2016-05-23: qty 10

## 2016-05-23 MED ORDER — BACITRACIN ZINC 500 UNIT/GM EX OINT
TOPICAL_OINTMENT | CUTANEOUS | Status: AC
  Filled 2016-05-23: qty 1.8

## 2016-05-23 MED ORDER — DEXAMETHASONE SODIUM PHOSPHATE 10 MG/ML IJ SOLN
INTRAMUSCULAR | Status: AC
  Filled 2016-05-23: qty 1

## 2016-05-23 MED ORDER — AMOXICILLIN 875 MG PO TABS: 875.0000 mg | ORAL_TABLET | Freq: Two times a day (BID) | ORAL | 0 refills | Status: AC

## 2016-05-23 MED ORDER — MIDAZOLAM HCL 2 MG/2ML IJ SOLN
INTRAMUSCULAR | Status: AC
  Filled 2016-05-23: qty 2

## 2016-05-23 MED ORDER — ONDANSETRON HCL 4 MG/2ML IJ SOLN
INTRAMUSCULAR | Status: AC
  Filled 2016-05-23: qty 2

## 2016-05-23 MED ORDER — ARTIFICIAL TEARS OP OINT
TOPICAL_OINTMENT | OPHTHALMIC | Status: AC
  Filled 2016-05-23: qty 3.5

## 2016-05-23 MED ORDER — FENTANYL CITRATE (PF) 100 MCG/2ML IJ SOLN
50.0000 ug | INTRAMUSCULAR | Status: DC | PRN
  Administered 2016-05-23: 100 ug via INTRAVENOUS

## 2016-05-23 MED ORDER — PROMETHAZINE HCL 25 MG/ML IJ SOLN: 6.2500 mg | INTRAMUSCULAR | Status: DC | PRN

## 2016-05-23 MED ORDER — OXYCODONE-ACETAMINOPHEN 5-325 MG PO TABS: 1.0000 | ORAL_TABLET | Freq: Four times a day (QID) | ORAL | 0 refills | Status: DC | PRN

## 2016-05-23 MED ORDER — LIDOCAINE 2% (20 MG/ML) 5 ML SYRINGE
INTRAMUSCULAR | Status: DC | PRN
  Administered 2016-05-23: 60 mg via INTRAVENOUS

## 2016-05-23 MED ORDER — CIPROFLOXACIN-DEXAMETHASONE 0.3-0.1 % OT SUSP
OTIC | Status: AC
  Filled 2016-05-23: qty 7.5

## 2016-05-23 MED ORDER — FENTANYL CITRATE (PF) 100 MCG/2ML IJ SOLN
INTRAMUSCULAR | Status: AC
  Filled 2016-05-23: qty 2

## 2016-05-23 MED ORDER — HYDROMORPHONE HCL 1 MG/ML IJ SOLN: 0.2500 mg | INTRAMUSCULAR | Status: DC | PRN

## 2016-05-23 MED ORDER — OXYMETAZOLINE HCL 0.05 % NA SOLN
NASAL | Status: DC | PRN
  Administered 2016-05-23: 1

## 2016-05-23 MED ORDER — ONDANSETRON HCL 4 MG/2ML IJ SOLN
INTRAMUSCULAR | Status: DC | PRN
  Administered 2016-05-23: 4 mg via INTRAVENOUS

## 2016-05-23 MED ORDER — PROMETHAZINE HCL 25 MG/ML IJ SOLN
INTRAMUSCULAR | Status: AC
  Filled 2016-05-23: qty 1

## 2016-05-23 MED ORDER — OXYMETAZOLINE HCL 0.05 % NA SOLN
NASAL | Status: AC
  Filled 2016-05-23: qty 15

## 2016-05-23 MED ORDER — MIDAZOLAM HCL 2 MG/2ML IJ SOLN
1.0000 mg | INTRAMUSCULAR | Status: DC | PRN
  Administered 2016-05-23: 2 mg via INTRAVENOUS

## 2016-05-23 MED ORDER — MUPIROCIN 2 % EX OINT
TOPICAL_OINTMENT | CUTANEOUS | Status: AC
Start: 2016-05-23 — End: 2016-05-23
  Filled 2016-05-23: qty 22

## 2016-05-23 MED ORDER — CEFAZOLIN SODIUM-DEXTROSE 2-4 GM/100ML-% IV SOLN
INTRAVENOUS | Status: AC
  Filled 2016-05-23: qty 100

## 2016-05-23 MED ORDER — SCOPOLAMINE 1 MG/3DAYS TD PT72
MEDICATED_PATCH | TRANSDERMAL | Status: AC
  Filled 2016-05-23: qty 1

## 2016-05-23 MED ORDER — LACTATED RINGERS IV SOLN
INTRAVENOUS | Status: DC
  Administered 2016-05-23: 10 mL/h via INTRAVENOUS

## 2016-05-23 MED ORDER — PROPOFOL 10 MG/ML IV BOLUS
INTRAVENOUS | Status: AC
  Filled 2016-05-23: qty 20

## 2016-05-23 MED ORDER — SCOPOLAMINE 1 MG/3DAYS TD PT72: 1.0000 | MEDICATED_PATCH | Freq: Once | TRANSDERMAL | Status: DC | PRN

## 2016-05-23 MED ORDER — PROMETHAZINE HCL 25 MG/ML IJ SOLN
6.2500 mg | INTRAMUSCULAR | Status: DC | PRN
  Administered 2016-05-23: 6.25 mg via INTRAVENOUS

## 2016-05-23 MED ORDER — SUCCINYLCHOLINE CHLORIDE 20 MG/ML IJ SOLN
INTRAMUSCULAR | Status: DC | PRN
  Administered 2016-05-23: 100 mg via INTRAVENOUS

## 2016-05-23 MED ORDER — DEXAMETHASONE SODIUM PHOSPHATE 4 MG/ML IJ SOLN
INTRAMUSCULAR | Status: DC | PRN
  Administered 2016-05-23: 10 mg via INTRAVENOUS

## 2016-05-23 MED ORDER — PROPOFOL 10 MG/ML IV BOLUS
INTRAVENOUS | Status: DC | PRN
  Administered 2016-05-23: 150 mg via INTRAVENOUS

## 2016-05-23 MED ORDER — LIDOCAINE 2% (20 MG/ML) 5 ML SYRINGE
INTRAMUSCULAR | Status: AC
  Filled 2016-05-23: qty 5

## 2016-05-23 SURGICAL SUPPLY — 21 items
ATTRACTOMAT 16X20 MAGNETIC DRP (DRAPES) IMPLANT
CANISTER SUCT 1200ML W/VALVE (MISCELLANEOUS) ×2 IMPLANT
COAGULATOR SUCT 8FR VV (MISCELLANEOUS) IMPLANT
DECANTER SPIKE VIAL GLASS SM (MISCELLANEOUS) IMPLANT
ELECT REM PT RETURN 9FT ADLT (ELECTROSURGICAL) ×2
ELECTRODE REM PT RTRN 9FT ADLT (ELECTROSURGICAL) ×1 IMPLANT
GLOVE BIO SURGEON STRL SZ7.5 (GLOVE) ×2 IMPLANT
GOWN STRL REUS W/ TWL LRG LVL3 (GOWN DISPOSABLE) ×2 IMPLANT
GOWN STRL REUS W/TWL LRG LVL3 (GOWN DISPOSABLE) ×2
NEEDLE HYPO 25X1 1.5 SAFETY (NEEDLE) IMPLANT
NS IRRIG 1000ML POUR BTL (IV SOLUTION) ×2 IMPLANT
PACK BASIN DAY SURGERY FS (CUSTOM PROCEDURE TRAY) ×2 IMPLANT
PACK ENT DAY SURGERY (CUSTOM PROCEDURE TRAY) ×2 IMPLANT
PACKING NASAL EPIS 4X2.4 XEROG (MISCELLANEOUS) IMPLANT
PATTIES SURGICAL .5 X3 (DISPOSABLE) IMPLANT
SLEEVE SCD COMPRESS KNEE MED (MISCELLANEOUS) IMPLANT
SOLUTION BUTLER CLEAR DIP (MISCELLANEOUS) ×2 IMPLANT
SPONGE GAUZE 2X2 8PLY STRL LF (GAUZE/BANDAGES/DRESSINGS) ×2 IMPLANT
SPONGE NEURO XRAY DETECT 1X3 (DISPOSABLE) ×2 IMPLANT
TOWEL OR 17X24 6PK STRL BLUE (TOWEL DISPOSABLE) ×2 IMPLANT
YANKAUER SUCT BULB TIP NO VENT (SUCTIONS) IMPLANT

## 2016-05-23 NOTE — Transfer of Care (Signed)
Immediate Anesthesia Transfer of Care Note  Patient: Christine Hendrix  Procedure(s) Performed: Procedure(s): BILATERAL TURBINATE REDUCTION (Bilateral)  Patient Location: PACU  Anesthesia Type:General  Level of Consciousness: awake, sedated and patient cooperative  Airway & Oxygen Therapy: Patient Spontanous Breathing and Patient connected to face mask oxygen  Post-op Assessment: Report given to RN and Post -op Vital signs reviewed and stable  Post vital signs: Reviewed and stable  Last Vitals:  Vitals:   05/23/16 1032  BP: (!) 147/68  Pulse: 98  Resp: 20  Temp: 36.7 C    Last Pain:  Vitals:   05/23/16 1032  TempSrc: Oral         Complications: No apparent anesthesia complications

## 2016-05-23 NOTE — Op Note (Signed)
DATE OF PROCEDURE: 05/23/2016  OPERATIVE REPORT   SURGEON: Newman PiesSu Kalsey Lull, MD   PREOPERATIVE DIAGNOSES:  1. Chronic nasal obstruction.  2. Bilateral inferior turbinate hypertrophy.   POSTOPERATIVE DIAGNOSES:  1. Chronic nasal obstruction.  2. Bilateral inferior turbinate hypertrophy.   PROCEDURE PERFORMED: Bilateral partial inferior turbinate resection.   ANESTHESIA: General endotracheal tube anesthesia.   COMPLICATIONS: None.   ESTIMATED BLOOD LOSS: Minimal.   INDICATION FOR PROCEDURE :Christine Hendrix is a 50 y.o. female with a history of chronic nasal obstruction. The patient was treated with antihistamine, decongestant, steroid nasal spray, and immunotherapy. However, the patient continues to be symptomatic. On examination, the patient was noted to have bilateral severe inferior turbinate hypertrophy, causing significant nasal obstruction. Based on the above findings, the decision was made for the patient to undergo the above-stated procedure. The risks, benefits, alternatives, and details of the procedure were discussed with the patient. Questions were invited and answered. Informed consent was obtained.   DESCRIPTION: The patient was taken to the operating room and placed supine on the operating table. General endotracheal tube anesthesia was administered by the anesthesiologist. The patient was positioned and prepped and draped in a standard fashion for nasal surgery. Pledgets soaked with Afrin were placed in both nasal cavities. The pledgets were subsequently removed. Examination of the nasal cavities revealed bilateral severe inferior turbinate hypertrophy. The inferior one-half of each inferior turbinate was then crossclamped with a straight Kelly clamp. The inferior one-half of each inferior turbinate was then resected with a pair of cross cutting scissors. Hemostasis was achieved with suction electrocautery, under direct visual guidance of the zero-degree endoscope. Good hemostasis was  achieved. The care of the patient was turned over to the anesthesiologist. The patient was awakened from anesthesia without difficulty. The patient was extubated and transferred to the recovery room in good condition.   OPERATIVE FINDINGS: Bilateral inferior turbinate hypertrophy.   SPECIMEN: None.   FOLLOWUP CARE: The patient will be discharged home once she is awake and alert. The patient will be placed on percocet 1-2 tablets p.o. q.6 h. p.r.n. pain, and amoxicillin 875 mg p.o. b.i.d. for 5 days. The patient will follow up in my office in approximately 1 week.   Newman PiesSu Kairon Shock, MD

## 2016-05-23 NOTE — Anesthesia Preprocedure Evaluation (Addendum)
Anesthesia Evaluation  Patient identified by MRN, date of birth, ID band Patient awake    Reviewed: Allergy & Precautions, NPO status , Patient's Chart, lab work & pertinent test results  Airway Mallampati: II  TM Distance: <3 FB Neck ROM: Full  Mouth opening: Limited Mouth Opening  Dental  (+) Dental Advisory Given   Pulmonary neg pulmonary ROS,    Pulmonary exam normal        Cardiovascular negative cardio ROS   Rhythm:Regular Rate:Normal     Neuro/Psych  Headaches,    GI/Hepatic negative GI ROS, Neg liver ROS,   Endo/Other  Hypothyroidism   Renal/GU negative Renal ROS     Musculoskeletal  (+) Arthritis ,   Abdominal   Peds  Hematology negative hematology ROS (+)   Anesthesia Other Findings   Reproductive/Obstetrics                           Anesthesia Physical Anesthesia Plan  ASA: II  Anesthesia Plan: General   Post-op Pain Management:    Induction: Intravenous  Airway Management Planned: Oral ETT  Additional Equipment:   Intra-op Plan:   Post-operative Plan: Extubation in OR  Informed Consent: I have reviewed the patients History and Physical, chart, labs and discussed the procedure including the risks, benefits and alternatives for the proposed anesthesia with the patient or authorized representative who has indicated his/her understanding and acceptance.   Dental advisory given  Plan Discussed with: CRNA  Anesthesia Plan Comments:         Anesthesia Quick Evaluation

## 2016-05-23 NOTE — H&P (Signed)
Cc: Chronic nasal congestion  HPI: The patient is a 50 year old female who returns today for her follow-up evaluation.  The patient was previously seen for chronic nasal congestion and recurrent sinusitis.  She was last seen 1 month ago.  At that time, she was noted to have chronic rhinitis, nasal septal deviation and bilateral inferior turbinate hypertrophy.  There was no evidence of acute or chronic sinusitis.  The patient was continued on her allergy medication regimen and immunotherapy. She subsequently underwent a sinus CT scan.  The CT showed no evidence of significant sinusitis.  Mild septal deviation and bilateral inferior turbinate hypertrophy were noted.  The patient returns reporting no improvement in her nasal obstruction. She has significant difficulty breathing through her nostrils, especially at night.  Currently, she denies any facial pain, fever, or visual change.   Exam: The nasal cavities were decongested and anesthetised with a combination of oxymetazoline and 4% lidocaine solution.  The flexible scope was inserted into the right nasal cavity.  Endoscopy of the inferior and middle meatus was performed.  Edematous mucosa was noted.  No polyp, mass, or lesion was appreciated. Mild septal deviation. Olfactory cleft was clear.  Nasopharynx was clear.  Turbinates were hypertrophied but without mass.  Incomplete response to decongestion.  The procedure was repeated on the contralateral side with similar findings.  The patient tolerated the procedure well.  Instructions were given to avoid eating or drinking for 2 hours.    Assessment: 1.  Chronic nasal obstruction, secondary to bilateral inferior turbinate hypertrophy.  More than 90% of her nasal passageways are obstructed. Mild septal deviation is also noted.   2.  There is no evidence of acute or chronic sinusitis.   Plan: 1.  The nasal endoscopy findings and the CT images are reviewed extensively with the patient.  2.  The treatment  options are discussed.  The options include continuing conservative observation with medical therapy versus surgical intervention with bilateral inferior turbinate reduction with or without septoplasty.  3.  The risks, benefits, alternatives and details of the treatment options are discussed.  4.  The patient would like to proceed with the turbinate reduction procedure.

## 2016-05-23 NOTE — Anesthesia Procedure Notes (Signed)
Procedure Name: Intubation Date/Time: 05/23/2016 11:47 AM Performed by: Gar GibbonKEETON, Charee Tumblin S Pre-anesthesia Checklist: Patient identified, Emergency Drugs available, Suction available and Patient being monitored Patient Re-evaluated:Patient Re-evaluated prior to inductionOxygen Delivery Method: Circle system utilized Preoxygenation: Pre-oxygenation with 100% oxygen Intubation Type: IV induction Ventilation: Mask ventilation without difficulty Laryngoscope Size: Mac and 3 Grade View: Grade III Tube type: Oral Rae Tube size: 7.0 mm Number of attempts: 1 Airway Equipment and Method: Stylet and Oral airway Placement Confirmation: ETT inserted through vocal cords under direct vision,  positive ETCO2 and breath sounds checked- equal and bilateral Secured at: 20 cm Tube secured with: Tape Dental Injury: Teeth and Oropharynx as per pre-operative assessment

## 2016-05-23 NOTE — Discharge Instructions (Addendum)
Call your surgeon if you experience:   1.  Fever over 101.0. 2.  Inability to urinate. 3.  Nausea and/or vomiting. 4.  Extreme swelling or bruising at the surgical site. 5.  Continued bleeding from the incision. 6.  Increased pain, redness or drainage from the incision. 7.  Problems related to your pain medication. 8.  Any problems and/or concerns Post Anesthesia Home Care Instructions  Activity: Get plenty of rest for the remainder of the day. A responsible adult should stay with you for 24 hours following the procedure.  For the next 24 hours, DO NOT: -Drive a car -Advertising copywriterperate machinery -Drink alcoholic beverages -Take any medication unless instructed by your physician -Make any legal decisions or sign important papers.  Meals: Start with liquid foods such as gelatin or soup. Progress to regular foods as tolerated. Avoid greasy, spicy, heavy foods. If nausea and/or vomiting occur, drink only clear liquids until the nausea and/or vomiting subsides. Call your physician if vomiting continues.  Special Instructions/Symptoms: Your throat may feel dry or sore from the anesthesia or the breathing tube placed in your throat during surgery. If this causes discomfort, gargle with warm salt water. The discomfort should disappear within 24 hours.  If you had a scopolamine patch placed behind your ear for the management of post- operative nausea and/or vomiting:  1. The medication in the patch is effective for 72 hours, after which it should be removed.  Wrap patch in a tissue and discard in the trash. Wash hands thoroughly with soap and water. 2. You may remove the patch earlier than 72 hours if you experience unpleasant side effects which may include dry mouth, dizziness or visual disturbances. 3. Avoid touching the patch. Wash your hands with soap and water after contact with the patch.   POSTOPERATIVE INSTRUCTIONS FOR PATIENTS HAVING NASAL OR SINUS OPERATIONS ACTIVITY: Restrict activity at  home for the first two days, resting as much as possible. Light activity is best. You may usually return to work within a week. You should refrain from nose blowing, strenuous activity, or heavy lifting greater than 20lbs for a total of three weeks after your operation.  If sneezing cannot be avoided, sneeze with your mouth open. DISCOMFORT: You may experience a dull headache and pressure along with nasal congestion and discharge. These symptoms may be worse during the first week after the operation but may last as long as two to four weeks.  Please take Tylenol or the pain medication that has been prescribed for you. Do not take aspirin or aspirin containing medications since they may cause bleeding.  You may experience symptoms of post nasal drainage, nasal congestion, headaches and fatigue for two or three months after your operation.  BLEEDING: You may have some blood tinged nasal drainage for approximately two weeks after the operation.  The discharge will be worse for the first week.  Please call our office at [phone] or go to the nearest hospital emergency room if you experience any of the following: heavy, bright red blood from your nose or mouth that lasts longer than ten minutes or coughing up or vomiting bright red blood or blood clots. GENERAL CONSIDERATIONS: 1. A gauze dressing will be placed on your upper lip to absorb any drainage after the operation. You may need to change this several times a day.  If you do not have very much drainage, you may remove the dressing.  Remember that you may gently wipe your nose with a tissue and sniff in,  but DO NOT blow your nose. 2. Please keep all of your postoperative appointments.  Your final results after the operation will depend on proper follow-up.  The initial visit is usually four to seven days after the operation.  During this visit, the remaining nasal packing and internal septal splints will be removed.  Your nasal and sinus cavities will be  cleaned.  During the second visit, your nasal and sinus cavities will be cleaned again. Have someone drive you to your first two postoperative appointments. We suggest that you take your prescribed pain medication about  hour prior to each of these two appointments.  3. How you care for your nose after the operation will influence the results that you obtain.  You should follow all directions, take your medication as prescribed, and call our office [phone] with any problems or questions. 4. You may be more comfortable sleeping with your head elevated on two pillows. 5. Do not take any medications that we have not prescribed or recommended. WARNING SIGNS: if any of the following should occur, please call our office: 1. Bright red bleeding which lasts more than 10 minutes. 2. Persistent fever greater than 102F. 3. Persistent vomiting. 4. Severe and constant pain that is not relieved by prescribed pain medication. 5. Trauma to the nose. 6. Rash or unusual side effects from any medicines.

## 2016-05-23 NOTE — Anesthesia Postprocedure Evaluation (Signed)
Anesthesia Post Note  Patient: Christine Hendrix  Procedure(s) Performed: Procedure(s) (LRB): BILATERAL TURBINATE REDUCTION (Bilateral)  Patient location during evaluation: PACU Anesthesia Type: General Level of consciousness: awake and alert Pain management: pain level controlled Vital Signs Assessment: post-procedure vital signs reviewed and stable Respiratory status: spontaneous breathing, nonlabored ventilation, respiratory function stable and patient connected to nasal cannula oxygen Cardiovascular status: blood pressure returned to baseline and stable Postop Assessment: no signs of nausea or vomiting Anesthetic complications: no       Last Vitals:  Vitals:   05/23/16 1300 05/23/16 1344  BP: (!) 176/103 (!) 166/86  Pulse: 88 82  Resp: 18 20  Temp:  36.7 C    Last Pain:  Vitals:   05/23/16 1344  TempSrc:   PainSc: 0-No pain                 Kennieth RadFitzgerald, Samadhi Mahurin E

## 2016-06-09 ENCOUNTER — Ambulatory Visit (INDEPENDENT_AMBULATORY_CARE_PROVIDER_SITE_OTHER): Payer: Self-pay | Admitting: Podiatry

## 2016-06-09 ENCOUNTER — Ambulatory Visit (INDEPENDENT_AMBULATORY_CARE_PROVIDER_SITE_OTHER): Payer: BLUE CROSS/BLUE SHIELD

## 2016-06-09 ENCOUNTER — Encounter: Payer: Self-pay | Admitting: Podiatry

## 2016-06-09 DIAGNOSIS — Z9889 Other specified postprocedural states: Secondary | ICD-10-CM

## 2016-06-09 DIAGNOSIS — M21619 Bunion of unspecified foot: Secondary | ICD-10-CM

## 2016-06-09 DIAGNOSIS — M21969 Unspecified acquired deformity of unspecified lower leg: Secondary | ICD-10-CM | POA: Diagnosis not present

## 2016-06-09 DIAGNOSIS — M2041 Other hammer toe(s) (acquired), right foot: Secondary | ICD-10-CM

## 2016-06-09 DIAGNOSIS — M2042 Other hammer toe(s) (acquired), left foot: Secondary | ICD-10-CM

## 2016-06-09 MED ORDER — DICLOFENAC SODIUM 75 MG PO TBEC: 75.0000 mg | DELAYED_RELEASE_TABLET | Freq: Two times a day (BID) | ORAL | 0 refills | Status: DC

## 2016-06-15 NOTE — Progress Notes (Signed)
Subjective:     Patient ID: Talmadge Coventryracy Verrill, female   DOB: 06/21/1965, 51 y.o.   MRN: [id number]  HPI patient states that she's doing pretty well but she still has some soreness in her big toe joint and her fifth toe still does not feel stable   Review of Systems     Objective:   Physical Exam Neurovascular status intact muscle strength adequate with patient's left hallux doing well with wound edges well coapted good alignment good range of motion with mild discomfort when forced dorsiflexion occurred with the fifth toe showing excellent alignment and mild instability normal for this.    Assessment:     Appears to still be be progressing well with mild increase and inflammation which may be due to activity but no indication of range of motion loss or indications of arthritis    Plan:     H&P x-rays reviewed and at this point I have recommended that we start her on oral anti-inflammatory not go barefoot and immobilized for several weeks. If symptoms persist we will need to consider changing medications or other pathology but x-rays so far look excellent  X-rays indicate that the osteotomy has healed well pins in place joint congruence and open with no indication of arthritic pathology

## 2016-07-04 ENCOUNTER — Other Ambulatory Visit: Payer: Self-pay

## 2016-07-04 MED ORDER — DICLOFENAC SODIUM 75 MG PO TBEC: 75.0000 mg | DELAYED_RELEASE_TABLET | Freq: Two times a day (BID) | ORAL | 1 refills | Status: DC

## 2016-07-04 NOTE — Telephone Encounter (Signed)
Pt called requesting a refill on Diclofenac per Joya SanValery.  Called and spoke to pt at 12:15 informing her that a 30 day supply of Diclofenac has been sent to her pharmacy. She voiced her understanding.

## 2016-07-07 ENCOUNTER — Telehealth: Payer: Self-pay

## 2016-07-07 NOTE — Telephone Encounter (Signed)
Spoke with pt regarding questions about post op exercising and splinting her toe.

## 2016-09-12 ENCOUNTER — Ambulatory Visit: Payer: BLUE CROSS/BLUE SHIELD

## 2016-09-18 ENCOUNTER — Ambulatory Visit: Payer: BLUE CROSS/BLUE SHIELD | Admitting: Podiatry

## 2016-10-03 ENCOUNTER — Ambulatory Visit (INDEPENDENT_AMBULATORY_CARE_PROVIDER_SITE_OTHER): Payer: BLUE CROSS/BLUE SHIELD

## 2016-10-03 ENCOUNTER — Ambulatory Visit (INDEPENDENT_AMBULATORY_CARE_PROVIDER_SITE_OTHER): Payer: Self-pay | Admitting: Podiatry

## 2016-10-03 VITALS — BP 135/91 | HR 95 | Resp 16

## 2016-10-03 DIAGNOSIS — M21619 Bunion of unspecified foot: Secondary | ICD-10-CM

## 2016-10-03 DIAGNOSIS — M2041 Other hammer toe(s) (acquired), right foot: Secondary | ICD-10-CM

## 2016-10-03 DIAGNOSIS — M2042 Other hammer toe(s) (acquired), left foot: Secondary | ICD-10-CM | POA: Diagnosis not present

## 2016-10-05 NOTE — Progress Notes (Signed)
Subjective:    Patient ID: Christine Hendrix, female   DOB: 51 y.o.   MRN: [id number]   HPI patient states she seems to be getting better but she does still have some occasional issues with the lesser digits    ROS      Objective:  Physical Exam Neurovascular status intact with left foot healing very well with incision sites well coapted with excellent range of motion of the first MPJ and the fourth and fifth digit showing much more strength as the postoperative continues    Assessment:   Doing well overall with symptoms still present to a mild nature but should continue to improve as she has to      Plan:    X-rays reviewed and allow her to return to normal activity as best as possible and normal shoe gear. Reappoint if symptoms were to continue  X-rays indicate that there is good healing of the osteotomy with good digital position of the lesser digits and no signs of pathology current

## 2016-10-08 DIAGNOSIS — J309 Allergic rhinitis, unspecified: Secondary | ICD-10-CM | POA: Insufficient documentation

## 2016-10-22 ENCOUNTER — Other Ambulatory Visit (INDEPENDENT_AMBULATORY_CARE_PROVIDER_SITE_OTHER): Payer: Self-pay | Admitting: Otolaryngology

## 2016-10-22 DIAGNOSIS — J329 Chronic sinusitis, unspecified: Secondary | ICD-10-CM

## 2016-10-23 ENCOUNTER — Ambulatory Visit
Admission: RE | Admit: 2016-10-23 | Discharge: 2016-10-23 | Disposition: A | Discharge status: Home / Self Care | Payer: BLUE CROSS/BLUE SHIELD | Source: Ambulatory Visit | Attending: Otolaryngology | Admitting: Otolaryngology

## 2016-10-23 DIAGNOSIS — J329 Chronic sinusitis, unspecified: Secondary | ICD-10-CM

## 2016-10-24 DIAGNOSIS — Z79890 Hormone replacement therapy (postmenopausal): Secondary | ICD-10-CM | POA: Insufficient documentation

## 2016-10-24 DIAGNOSIS — B373 Candidiasis of vulva and vagina: Secondary | ICD-10-CM | POA: Insufficient documentation

## 2016-10-24 DIAGNOSIS — B3731 Acute candidiasis of vulva and vagina: Secondary | ICD-10-CM | POA: Insufficient documentation

## 2016-11-14 MED ORDER — DICLOFENAC SODIUM 75 MG PO TBEC: 75.0000 mg | DELAYED_RELEASE_TABLET | Freq: Two times a day (BID) | ORAL | 1 refills | Status: DC

## 2017-01-20 ENCOUNTER — Telehealth: Payer: Self-pay | Admitting: Podiatry

## 2017-01-20 NOTE — Telephone Encounter (Signed)
Pt called wanting to ask some questions about her surgery she had done in October of 2017.

## 2017-01-20 NOTE — Telephone Encounter (Addendum)
Left message to call again concerning her 02/2016 surgery. Pt states after 10 months she still has a great deal of pain in the little toe and the other toes are no problem. I offered pt an appt to discuss with Dr. Charlsie Merles and she states he doesn't give her much different information from the previous visits. I offered an appt with a different doctor, but informed pt that often little remain sore or swollen longer than the other toes because they touch so many different areas and are slow to loose swelling. I offered a silicone shield to buffer the sensation of the shoe on the toe and pt states she may come by on Friday at about 1:30pm to get one.01/30/2017-Pt states will be finished with appts today at 11:30am and come by. I informed pt that would be fine, I do have to take lunch between 12:00pm - 1:00pm. Pt presented to office in flip-flops. I took pt into exam room one, and touched pt's left 5th toe while she was watching and pt pushed my hand away and flinched. Pt did allow me to put a small silicone shield over the left 5th toe. Pt stated that she is feeling a nerve pain in the left 5th toe, and explained she has had facial bone and nerve surgery left side and recognizes nerve pain. Pt continued to explain difficulty wearing enclosed shoes and severe pain: as she was talking and not focused on the left 5th toe, I touched the left 5th toe and pt flinched jerking the left foot away.i explained the silicone shield would allow for smoother movement of the left 5th toe in the shoe, protect the sensitive toe from roughness or hardness. I told pt to try the shields in enclosed shoes, by wearing the shoe as long as it was comfortable, if it became uncomfortable go back in to the sandal, and if she felt she could go back and forth between the enclosed shoe and sandal through the day, if not try the enclosed shoe again the next day. I told pt that this trial would hopefully help condition the toe to be in enclosed shoes. I  told pt to make an appt in 2 weeks with Dr. Samuella Cota to get a second evaluation between him and Dr. Charlsie Merles. Pt asked if Dr. Charlsie Merles would be offended and I told her he only wanted to get her well and would be happy to discuss with Dr. Samuella Cota. I gave pt 2 small silicone shields, but cut one down about 1/8", and I gave pt my business card with Dr. Kandice Hams name to make 2 week appt.

## 2017-03-27 DIAGNOSIS — N200 Calculus of kidney: Secondary | ICD-10-CM | POA: Insufficient documentation

## 2017-03-27 HISTORY — DX: Calculus of kidney: N20.0

## 2017-06-04 DIAGNOSIS — J301 Allergic rhinitis due to pollen: Secondary | ICD-10-CM | POA: Diagnosis not present

## 2017-06-11 DIAGNOSIS — J301 Allergic rhinitis due to pollen: Secondary | ICD-10-CM | POA: Diagnosis not present

## 2017-07-03 DIAGNOSIS — G43709 Chronic migraine without aura, not intractable, without status migrainosus: Secondary | ICD-10-CM | POA: Diagnosis not present

## 2017-07-07 DIAGNOSIS — S81832A Puncture wound without foreign body, left lower leg, initial encounter: Secondary | ICD-10-CM | POA: Diagnosis not present

## 2017-07-07 DIAGNOSIS — W5501XA Bitten by cat, initial encounter: Secondary | ICD-10-CM | POA: Diagnosis not present

## 2017-08-07 DIAGNOSIS — R350 Frequency of micturition: Secondary | ICD-10-CM | POA: Diagnosis not present

## 2017-08-07 DIAGNOSIS — Z87442 Personal history of urinary calculi: Secondary | ICD-10-CM | POA: Diagnosis not present

## 2017-08-07 DIAGNOSIS — R1032 Left lower quadrant pain: Secondary | ICD-10-CM | POA: Diagnosis not present

## 2017-08-07 DIAGNOSIS — R102 Pelvic and perineal pain: Secondary | ICD-10-CM | POA: Diagnosis not present

## 2017-08-11 DIAGNOSIS — R1032 Left lower quadrant pain: Secondary | ICD-10-CM | POA: Diagnosis not present

## 2017-08-11 DIAGNOSIS — R3129 Other microscopic hematuria: Secondary | ICD-10-CM | POA: Diagnosis not present

## 2017-08-11 DIAGNOSIS — N200 Calculus of kidney: Secondary | ICD-10-CM | POA: Diagnosis not present

## 2017-09-10 DIAGNOSIS — J301 Allergic rhinitis due to pollen: Secondary | ICD-10-CM | POA: Diagnosis not present

## 2017-09-18 DIAGNOSIS — E039 Hypothyroidism, unspecified: Secondary | ICD-10-CM | POA: Diagnosis not present

## 2017-09-18 DIAGNOSIS — Z13220 Encounter for screening for lipoid disorders: Secondary | ICD-10-CM | POA: Diagnosis not present

## 2017-09-18 DIAGNOSIS — G43909 Migraine, unspecified, not intractable, without status migrainosus: Secondary | ICD-10-CM | POA: Diagnosis not present

## 2017-09-18 DIAGNOSIS — Z7689 Persons encountering health services in other specified circumstances: Secondary | ICD-10-CM | POA: Diagnosis not present

## 2017-09-18 DIAGNOSIS — Z800 Family history of malignant neoplasm of digestive organs: Secondary | ICD-10-CM | POA: Insufficient documentation

## 2017-09-23 DIAGNOSIS — Z13220 Encounter for screening for lipoid disorders: Secondary | ICD-10-CM | POA: Diagnosis not present

## 2017-09-23 DIAGNOSIS — E039 Hypothyroidism, unspecified: Secondary | ICD-10-CM | POA: Diagnosis not present

## 2017-11-12 DIAGNOSIS — G43009 Migraine without aura, not intractable, without status migrainosus: Secondary | ICD-10-CM | POA: Diagnosis not present

## 2017-11-12 DIAGNOSIS — H93293 Other abnormal auditory perceptions, bilateral: Secondary | ICD-10-CM | POA: Insufficient documentation

## 2017-11-12 DIAGNOSIS — R05 Cough: Secondary | ICD-10-CM | POA: Diagnosis not present

## 2017-11-12 DIAGNOSIS — R053 Chronic cough: Secondary | ICD-10-CM | POA: Insufficient documentation

## 2017-12-01 ENCOUNTER — Ambulatory Visit (INDEPENDENT_AMBULATORY_CARE_PROVIDER_SITE_OTHER): Payer: BLUE CROSS/BLUE SHIELD

## 2017-12-01 ENCOUNTER — Ambulatory Visit (INDEPENDENT_AMBULATORY_CARE_PROVIDER_SITE_OTHER): Payer: BLUE CROSS/BLUE SHIELD | Admitting: Podiatry

## 2017-12-01 ENCOUNTER — Encounter: Payer: Self-pay | Admitting: Podiatry

## 2017-12-01 ENCOUNTER — Other Ambulatory Visit: Payer: Self-pay | Admitting: Podiatry

## 2017-12-01 DIAGNOSIS — S99922A Unspecified injury of left foot, initial encounter: Secondary | ICD-10-CM | POA: Diagnosis not present

## 2017-12-01 DIAGNOSIS — L923 Foreign body granuloma of the skin and subcutaneous tissue: Secondary | ICD-10-CM | POA: Diagnosis not present

## 2017-12-02 NOTE — Progress Notes (Signed)
Subjective:   Patient ID: Talmadge Coventryracy Magnan, female   DOB: 52 y.o.   MRN: [id number]   HPI Patient presents stating I stepped on a rusty nail couple weeks ago and I do have my tetanus up-to-date I just wanted to get it checked   ROS      Objective:  Physical Exam  Neurovascular status intact negative Homans sign noted puncture wound to the plantar aspect left foot midfoot there is localized with slight redness but no proximal edema erythema or drainage noted with an area of discoloration within the center of     Assessment:  Trauma to the left plantar foot secondary to foreign body with possibility for small metal fragment     Plan:  H&P x-ray reviewed and using sterile instrumentation I did careful opening of the area and cleaned it out and did not note any pus formation or drainage from the wound.  It is localized to this area.  Applied sterile dressing instructed on soaks gave strict instructions of any redness discomfort or other issues were to occur to let us know immediately  X-ray indicates no indications of abnormal metal or pathology from that perspective

## 2017-12-04 DIAGNOSIS — N898 Other specified noninflammatory disorders of vagina: Secondary | ICD-10-CM | POA: Diagnosis not present

## 2017-12-04 DIAGNOSIS — Z01419 Encounter for gynecological examination (general) (routine) without abnormal findings: Secondary | ICD-10-CM | POA: Insufficient documentation

## 2017-12-04 DIAGNOSIS — Z90710 Acquired absence of both cervix and uterus: Secondary | ICD-10-CM | POA: Diagnosis not present

## 2017-12-04 DIAGNOSIS — Z79890 Hormone replacement therapy (postmenopausal): Secondary | ICD-10-CM | POA: Diagnosis not present

## 2017-12-04 DIAGNOSIS — N952 Postmenopausal atrophic vaginitis: Secondary | ICD-10-CM | POA: Diagnosis not present

## 2017-12-05 ENCOUNTER — Emergency Department (HOSPITAL_BASED_OUTPATIENT_CLINIC_OR_DEPARTMENT_OTHER)
Admission: EM | Admit: 2017-12-05 | Discharge: 2017-12-05 | Disposition: A | Discharge status: Home / Self Care | Payer: BLUE CROSS/BLUE SHIELD | Attending: Emergency Medicine | Admitting: Emergency Medicine

## 2017-12-05 ENCOUNTER — Encounter (HOSPITAL_BASED_OUTPATIENT_CLINIC_OR_DEPARTMENT_OTHER): Payer: Self-pay | Admitting: *Deleted

## 2017-12-05 ENCOUNTER — Emergency Department (HOSPITAL_BASED_OUTPATIENT_CLINIC_OR_DEPARTMENT_OTHER): Payer: BLUE CROSS/BLUE SHIELD

## 2017-12-05 ENCOUNTER — Other Ambulatory Visit: Payer: Self-pay

## 2017-12-05 DIAGNOSIS — Z79899 Other long term (current) drug therapy: Secondary | ICD-10-CM | POA: Insufficient documentation

## 2017-12-05 DIAGNOSIS — E039 Hypothyroidism, unspecified: Secondary | ICD-10-CM | POA: Insufficient documentation

## 2017-12-05 DIAGNOSIS — I4891 Unspecified atrial fibrillation: Secondary | ICD-10-CM

## 2017-12-05 DIAGNOSIS — R0602 Shortness of breath: Secondary | ICD-10-CM | POA: Diagnosis not present

## 2017-12-05 LAB — CBC WITH DIFFERENTIAL/PLATELET
Basophils Absolute: 0 10*3/uL (ref 0.0–0.1)
Basophils Relative: 0 %
Eosinophils Absolute: 0.1 10*3/uL (ref 0.0–0.7)
Eosinophils Relative: 1 %
HEMATOCRIT: 41.7 % (ref 36.0–46.0)
HEMOGLOBIN: 14.3 g/dL (ref 12.0–15.0)
LYMPHS ABS: 2.3 10*3/uL (ref 0.7–4.0)
LYMPHS PCT: 24 %
MCH: 29.1 pg (ref 26.0–34.0)
MCHC: 34.3 g/dL (ref 30.0–36.0)
MCV: 84.8 fL (ref 78.0–100.0)
Monocytes Absolute: 0.8 10*3/uL (ref 0.1–1.0)
Monocytes Relative: 8 %
NEUTROS ABS: 6.6 10*3/uL (ref 1.7–7.7)
Neutrophils Relative %: 67 %
Platelets: 268 10*3/uL (ref 150–400)
RBC: 4.92 MIL/uL (ref 3.87–5.11)
RDW: 12.6 % (ref 11.5–15.5)
WBC: 9.8 10*3/uL (ref 4.0–10.5)

## 2017-12-05 LAB — TSH: TSH: 2.458 u[IU]/mL (ref 0.350–4.500)

## 2017-12-05 LAB — MAGNESIUM: Magnesium: 1.8 mg/dL (ref 1.7–2.4)

## 2017-12-05 LAB — D-DIMER, QUANTITATIVE (NOT AT ARMC): D DIMER QUANT: 0.27 ug{FEU}/mL (ref 0.00–0.50)

## 2017-12-05 LAB — BASIC METABOLIC PANEL
ANION GAP: 13 (ref 5–15)
BUN: 14 mg/dL (ref 6–20)
CHLORIDE: 100 mmol/L (ref 98–111)
CO2: 25 mmol/L (ref 22–32)
Calcium: 8.7 mg/dL — ABNORMAL LOW (ref 8.9–10.3)
Creatinine, Ser: 0.72 mg/dL (ref 0.44–1.00)
GFR calc non Af Amer: >60 mL/min (ref >60)
Glucose, Bld: 118 mg/dL — ABNORMAL HIGH (ref 70–99)
POTASSIUM: 3 mmol/L — AB (ref 3.5–5.1)
SODIUM: 138 mmol/L (ref 135–145)

## 2017-12-05 LAB — TROPONIN I: Troponin I: <0.03 ng/mL (ref <0.03)

## 2017-12-05 MED ORDER — DILTIAZEM HCL ER COATED BEADS 120 MG PO CP24
120.0000 mg | ORAL_CAPSULE | Freq: Once | ORAL | Status: AC
  Administered 2017-12-05: 120 mg via ORAL
  Filled 2017-12-05: qty 1

## 2017-12-05 MED ORDER — POTASSIUM CHLORIDE CRYS ER 20 MEQ PO TBCR
40.0000 meq | EXTENDED_RELEASE_TABLET | Freq: Once | ORAL | Status: AC
  Administered 2017-12-05: 40 meq via ORAL
  Filled 2017-12-05: qty 2

## 2017-12-05 MED ORDER — DILTIAZEM HCL ER COATED BEADS 120 MG PO CP24: 120.0000 mg | ORAL_CAPSULE | Freq: Every day | ORAL | 0 refills | Status: DC

## 2017-12-05 MED ORDER — DILTIAZEM LOAD VIA INFUSION
20.0000 mg | Freq: Once | INTRAVENOUS | Status: AC
  Administered 2017-12-05: 20 mg via INTRAVENOUS
  Filled 2017-12-05: qty 20

## 2017-12-05 MED ORDER — KETOROLAC TROMETHAMINE 30 MG/ML IJ SOLN
30.0000 mg | Freq: Once | INTRAMUSCULAR | Status: AC
  Administered 2017-12-05: 15 mg via INTRAVENOUS
  Filled 2017-12-05: qty 1

## 2017-12-05 MED ORDER — DILTIAZEM HCL 100 MG IV SOLR
5.0000 mg/h | INTRAVENOUS | Status: DC
  Filled 2017-12-05: qty 100

## 2017-12-05 NOTE — ED Triage Notes (Signed)
Up all night with migraine, shortness of breath and irregular heartbeat started at 0900.

## 2017-12-05 NOTE — ED Notes (Signed)
Pt eating cheese and crackers

## 2017-12-05 NOTE — ED Notes (Signed)
Rx x 1 given for cardizem. D/c home with family

## 2017-12-05 NOTE — ED Notes (Signed)
ED Provider at bedside. 

## 2017-12-05 NOTE — ED Notes (Signed)
MD notified of cardiac rhythm from a-fib to NSR

## 2017-12-05 NOTE — ED Provider Notes (Signed)
[facility] EMERGENCY DEPARTMENT Provider Note   CSN: [id number] Arrival date & time: 12/05/17  1004     History   Chief Complaint Chief Complaint  Patient presents with  . Shortness of Breath    Irregular Heartbeat    HPI Christine Hendrix is a 52 y.o. female.  Patient is a 52 year old female with a history of migraines, mitral valve prolapse and hypothyroidism who presents with shortness of breath and chest pain.  She states she had a migraine during the night and vomited couple times.  She states her migraine has resolved but about 9:00 this morning she started having shortness of breath and chest pain.  She is been having some heart palpitations as well.  She feels like her heart is regular.  She cannot tell me exactly when that started but she has had the feeling that her heart has been irregular in the past.  She has no diagnosis of cardiac problems other than the mitral valve prolapse.  She is not on anticoagulants.  She states she had a similar episode happen one time in the past several years ago but she waited about 6 to 7 hours to come in.  She went to her PCPs office and they called EMS.  On the way to the emergency room, her heart rate resolved without treatment.  She was observed in the ED and discharged from the ED.  She had no further follow-up with cardiology.     Past Medical History:  Diagnosis Date  . Arthritis    neck  . Colitis    chronic   . Dental crowns present    also dental implant lower right  . Hypothyroidism   . Migraines   . MVP (mitral valve prolapse)   . Nasal turbinate hypertrophy 04/2016  . PONV (postoperative nausea and vomiting)   . Psoriasis   . TMJ syndrome    limited jaw opening    Patient Active Problem List   Diagnosis Date Noted  . Abnormal auditory perception of both ears 11/12/2017  . Chronic cough 11/12/2017  . Family history of colon cancer in father 09/18/2017  . Kidney stone on left side 03/27/2017  .  Postmenopausal HRT (hormone replacement therapy) 10/24/2016  . Allergic rhinitis 10/08/2016  . Dysuria 09/20/2015  . Migraine without status migrainosus, not intractable 06/07/2015  . 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 Hematuria 05/06/2015  . Increased frequency of urination 05/06/2015  . Mixed incontinence 05/06/2015  . Plantar fasciitis of right foot 08/25/2014  . Pronation deformity of ankle, acquired 08/25/2014  . Metatarsal deformity 08/25/2014  . Acquired hammer toes of both feet 08/25/2014  . Intractable chronic migraine without aura and with status migrainosus 02/24/2014  . Acquired hypothyroidism 09/02/2013  . Cardiac dysrhythmia 09/02/2013  . Dysthymic disorder 09/02/2013  . Gastrointestinal disease 09/02/2013  . Memory loss 09/02/2013  . Myalgia and myositis 09/02/2013  . Neck pain 09/02/2013  . Obstructive sleep apnea 09/02/2013  . Restless legs syndrome 09/02/2013  . Persistent insomnia 09/02/2013  . Disorder of bursae and tendons in shoulder region 10/30/2012  . Shoulder joint pain 10/30/2012  . Nonrheumatic mitral valve prolapse 05/30/2011    Past Surgical History:  Procedure Laterality Date  . ABDOMINAL HYSTERECTOMY     complete  . BUNIONECTOMY WITH HAMMERTOE RECONSTRUCTION Left 03/04/2016   4th/5th toes  . CHOLECYSTECTOMY    . TMJ ARTHROPLASTY    . TRIGEMINAL NERVE DECOMPRESSION    . TURBINATE REDUCTION Bilateral 05/23/2016  Procedure: BILATERAL TURBINATE REDUCTION;  Surgeon: Newman Pies, MD;  Location: [facility];  Service: ENT;  Laterality: Bilateral;     OB History   None      Home Medications    Prior to Admission medications   Medication Sig Start Date End Date Taking? Authorizing Provider  amoxicillin (AMOXIL) 500 MG capsule Take 500 mg by mouth 3 (three) times daily.   Yes [provider]  Erenumab-aooe (AIMOVIG) 140 MG/ML SOAJ Inject into the skin. 10/05/17  Yes [provider]  estrogens, conjugated,  (PREMARIN) 1.25 MG tablet Take 1.25 mg by mouth daily.   Yes [provider]  levothyroxine (SYNTHROID, LEVOTHROID) 75 MCG tablet Take 75 mcg by mouth daily before breakfast.   Yes [provider]  cyclobenzaprine (FLEXERIL) 5 MG tablet  12/14/09   [provider]  diclofenac (VOLTAREN) 75 MG EC tablet Take 1 tablet (75 mg total) by mouth 2 (two) times daily. 07/04/16   Lenn Sink, DPM  diclofenac (VOLTAREN) 75 MG EC tablet Take 1 tablet (75 mg total) by mouth 2 (two) times daily. 11/14/16   Lenn Sink, DPM  dihydroergotamine (DHE) 1 MG/ML injection Inject into the muscle. 07/03/17   [provider]  diltiazem (CARDIZEM CD) 120 MG 24 hr capsule Take 1 capsule (120 mg total) by mouth daily. 12/05/17   Rolan Bucco, MD    Family History Family History  Problem Relation Age of Onset  . Heart disease Father   . Heart disease Mother     Social History Social History   Tobacco Use  . Smoking status: Never Smoker  . Smokeless tobacco: Never Used  Substance Use Topics  . Alcohol use: No    Alcohol/week: 0.0 oz  . Drug use: No     Allergies   Ciprofloxacin; Clindamycin; Codeine; Doxycycline; Quinidine; Quinolones; and Solifenacin   Review of Systems Review of Systems  Constitutional: Positive for fatigue. Negative for chills, diaphoresis and fever.  HENT: Negative for congestion, rhinorrhea and sneezing.   Eyes: Negative.   Respiratory: Positive for shortness of breath. Negative for cough and chest tightness.   Cardiovascular: Positive for chest pain. Negative for leg swelling.  Gastrointestinal: Negative for abdominal pain, blood in stool, diarrhea, nausea and vomiting.  Genitourinary: Negative for difficulty urinating, flank pain, frequency and hematuria.  Musculoskeletal: Negative for arthralgias and back pain.  Skin: Negative for rash.  Neurological: Positive for weakness (Generalized). Negative for dizziness, speech difficulty, numbness  and headaches.     Physical Exam Updated Vital Signs BP 119/79 (BP Location: Left Arm)   Pulse 71   Resp 18   Ht 5\' 4"  (1.626 m)   Wt 63.5 kg (140 lb)   SpO2 100%   BMI 24.03 kg/m   Physical Exam  Constitutional: She is oriented to person, place, and time. She appears well-developed and well-nourished.  HENT:  Head: Normocephalic and atraumatic.  Eyes: Pupils are equal, round, and reactive to light.  Neck: Normal range of motion. Neck supple.  Cardiovascular: Normal heart sounds. An irregularly irregular rhythm present. Tachycardia present.  Pulmonary/Chest: Effort normal and breath sounds normal. Tachypnea noted. No respiratory distress. She has no wheezes. She has no rales. She exhibits no tenderness.  Abdominal: Soft. Bowel sounds are normal. There is no tenderness. There is no rebound and no guarding.  Musculoskeletal: Normal range of motion. She exhibits no edema.  Lymphadenopathy:    She has no cervical adenopathy.  Neurological: She is alert and  oriented to person, place, and time.  Motor 5/5 all extremities Sensation grossly intact to LT all extremities  no pronator drift CN II-XII grossly intact    Skin: Skin is warm and dry. No rash noted.  Psychiatric: She has a normal mood and affect.     ED Treatments / Results  Labs (all labs ordered are listed, but only abnormal results are displayed) Labs Reviewed  BASIC METABOLIC PANEL - Abnormal; Notable for the following components:      Result Value   Potassium 3.0 (*)    Glucose, Bld 118 (*)    Calcium 8.7 (*)    All other components within normal limits  CBC WITH DIFFERENTIAL/PLATELET  TROPONIN I  MAGNESIUM  D-DIMER, QUANTITATIVE (NOT AT [facility])  TSH    EKG EKG Interpretation  Date/Time:  Saturday December 05 2017 12:43:59 EDT Ventricular Rate:  68 PR Interval:    QRS Duration: 87 QT Interval:  449 QTC Calculation: 478 R Axis:   52 Text Interpretation:  Sinus rhythm Confirmed by Rolan Bucco [phone])  on 12/05/2017 1:25:01 PM   Radiology Dg Chest Port 1 View  Result Date: 12/05/2017 CLINICAL DATA:  Shortness of breath EXAM: PORTABLE CHEST 1 VIEW COMPARISON:  None. FINDINGS: The heart size and mediastinal contours are within normal limits. Both lungs are clear. The visualized skeletal structures are unremarkable. IMPRESSION: No active disease. Electronically Signed   By: Gerome Sam III M.D   On: 12/05/2017 11:05    Procedures Procedures (including critical care time)  Medications Ordered in ED Medications  diltiazem (CARDIZEM) 1 mg/mL load via infusion 20 mg (20 mg Intravenous Bolus from Bag 12/05/17 1057)    And  diltiazem (CARDIZEM) 100 mg in dextrose 5 % 100 mL (1 mg/mL) infusion (0 mg/hr Intravenous Stopped 12/05/17 1240)  potassium chloride SA (K-DUR,KLOR-CON) CR tablet 40 mEq (40 mEq Oral Given 12/05/17 1117)  diltiazem (CARDIZEM CD) 24 hr capsule 120 mg (120 mg Oral Given 12/05/17 1204)  ketorolac (TORADOL) 30 MG/ML injection 30 mg (15 mg Intravenous Given 12/05/17 1407)     Initial Impression / Assessment and Plan / ED Course  I have reviewed the triage vital signs and the nursing notes.  Pertinent labs & imaging results that were available during my care of the patient were reviewed by me and considered in my medical decision making (see chart for details).     Patient is a 52 year old female who presents in A. fib with RVR.  It was unclear how long she has been in atrial fibrillation.  She does report an irregular heartbeat frequently.  She has never been diagnosed with A. fib in the past.  She was given a bolus of IV Cardizem at 10 mg.  This was followed by drip.  She did convert to sinus rhythm following this.  She still is having frequent PACs and was having some shortness of breath so she was monitored in the ED for a while.  I consulted with cardiology, Dr. Diona Browner who recommends starting the patient on Cardizem CD at 120 mg orally.  She was given a dose in the ED and  her Cardizem drip was stopped.  Her palpitations resolved.  Her PACs resolved.  Her symptoms resolved.  Her chest x-ray is clear without evidence of pneumonia or pulmonary edema.  Her d-dimer is normal without other suggestions of pulmonary embolus.  She has normal oxygen saturations.  She is able to ambulate without symptoms.  She did have a slight return of  her earlier headache and was given a dose of Toradol.  Her labs showed a hypokalemia and she was given some potassium replacement.  Her magnesium is normal.  Her TSH is still pending.  She was given a referral to follow-up in A. fib clinic.  She did not meet criteria to start on anticoagulants.  She was discharged home in good condition.  Return precautions were given.  This patients CHA2DS2-VASc Score and unadjusted Ischemic Stroke Rate (% per year) is equal to 0.6 % stroke rate/year from a score of 1  Above score calculated as 1 point each if present [CHF, HTN, DM, Vascular=MI/PAD/Aortic Plaque, Age if 65-74, or Female] Above score calculated as 2 points each if present [Age > 75, or Stroke/TIA/TE]  CRITICAL CARE Performed by: Rolan Bucco Total critical care time: 65 minutes Critical care time was exclusive of separately billable procedures and treating other patients. Critical care was necessary to treat or prevent imminent or life-threatening deterioration. Critical care was time spent personally by me on the following activities: development of treatment plan with patient and/or surrogate as well as nursing, discussions with consultants, evaluation of patient's response to treatment, examination of patient, obtaining history from patient or surrogate, ordering and performing treatments and interventions, ordering and review of laboratory studies, ordering and review of radiographic studies, pulse oximetry and re-evaluation of patient's condition.   Final Clinical Impressions(s) / ED Diagnoses   Final diagnoses:  New onset atrial  fibrillation [facility])    ED Discharge Orders        Ordered    Amb referral to AFIB Clinic     12/05/17 1413    diltiazem (CARDIZEM CD) 120 MG 24 hr capsule  Daily     12/05/17 1414       Rolan Bucco, MD 12/05/17 1417

## 2017-12-05 NOTE — ED Notes (Signed)
Cardizem drip started and bolus was initiated.  Unable to finish Cardizem bolus, HR dropped between 70's to 80's from 130's to 140's and BP of  117/56.  4.6 ml left to complete the 20 mg bolus of Cardizem and MD came and informed her about this.  She ordered to stopped the bolus and start the continuous drip at 5 mg/ml.  Will continue to monitor.

## 2017-12-15 DIAGNOSIS — E876 Hypokalemia: Secondary | ICD-10-CM | POA: Diagnosis not present

## 2017-12-15 DIAGNOSIS — I480 Paroxysmal atrial fibrillation: Secondary | ICD-10-CM | POA: Diagnosis not present

## 2017-12-18 NOTE — Progress Notes (Signed)
Referring-Melanie Belfi MD Reason for referral-Atrial fibrillation  HPI: 52 yo female for evaluation of atrial fibrillation at request of Rolan BuccoMelanie Belfi. H/O MVP. Seen in ER 12/05/17; with CP, palpitations and dyspnea; in atrial fibrillation; chest xray neg; TSH 2.458, K 3, Cr .72, Hgb 14.3, DDimer neg. Converted to sinus with cardizem.  Patient states that she has had intermittent palpitations for 7 years.  She has worn monitors in the past but has never had symptoms with monitor in place.  On the day she was seen in the emergency room she developed sudden onset palpitations.  They are described as an irregular heartbeat.  There is associated dyspnea.  There is no chest pain.  Note some of these episodes occur in association with migraine headaches and vomiting.  She otherwise has some dyspnea on exertion but denies orthopnea, PND, pedal edema, exertional chest pain and no syncope.  Etiology now asked to evaluate.  Current Outpatient Medications  Medication Sig Dispense Refill  . cyclobenzaprine (FLEXERIL) 5 MG tablet Take 5 mg by mouth 3 times/day as needed-between meals & bedtime.     . diclofenac (VOLTAREN) 75 MG EC tablet Take 1 tablet (75 mg total) by mouth 2 (two) times daily. 30 tablet 1  . diclofenac (VOLTAREN) 75 MG EC tablet Take 1 tablet (75 mg total) by mouth 2 (two) times daily. 30 tablet 1  . dihydroergotamine (DHE) 1 MG/ML injection Inject into the muscle.     . diltiazem (CARDIZEM CD) 120 MG 24 hr capsule Take 1 capsule (120 mg total) by mouth daily. 30 capsule 0  . Erenumab-aooe (AIMOVIG) 140 MG/ML SOAJ Inject into the skin.    Marland Kitchen. estrogens, conjugated, (PREMARIN) 1.25 MG tablet Take 1.25 mg by mouth daily.    Marland Kitchen. levothyroxine (SYNTHROID, LEVOTHROID) 75 MCG tablet Take 75 mcg by mouth daily before breakfast.     No current facility-administered medications for this visit.     Allergies  Allergen Reactions  . Ciprofloxacin Nausea Only  . Clindamycin Nausea Only  . Codeine  Nausea And Vomiting  . Doxycycline Nausea Only  . Quinidine Nausea Only  . Quinolones Nausea And Vomiting  . Solifenacin Other (See Comments)    BLURRED VISION     Past Medical History:  Diagnosis Date  . Arthritis    neck  . Colitis    chronic   . Dental crowns present    also dental implant lower right  . Hyperlipidemia   . Hypothyroidism   . Migraines   . MVP (mitral valve prolapse)   . Nasal turbinate hypertrophy 04/2016  . Nephrolithiasis   . PONV (postoperative nausea and vomiting)   . Psoriasis   . TMJ syndrome    limited jaw opening    Past Surgical History:  Procedure Laterality Date  . ABDOMINAL HYSTERECTOMY     complete  . BUNIONECTOMY WITH HAMMERTOE RECONSTRUCTION Left 03/04/2016   4th/5th toes  . CHOLECYSTECTOMY    . TMJ ARTHROPLASTY    . TRIGEMINAL NERVE DECOMPRESSION    . TURBINATE REDUCTION Bilateral 05/23/2016   Procedure: BILATERAL TURBINATE REDUCTION;  Surgeon: Newman PiesSu Teoh, MD;  Location: [facility];  Service: ENT;  Laterality: Bilateral;    Social History   Socioeconomic History  . Marital status: Married    Spouse name: Not on file  . Number of children: Not on file  . Years of education: Not on file  . 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 Financial resource strain: Not on file  . Food insecurity:    Worry: Not on file    Inability: Not on file  . Transportation needs:    Medical: Not on file    Non-medical: Not on file  Tobacco Use  . Smoking status: Never Smoker  . Smokeless tobacco: Never Used  Substance and Sexual Activity  . Alcohol use: No    Alcohol/week: 0.0 oz  . Drug use: No  . Sexual activity: Not on file  Lifestyle  . Physical activity:    Days per week: Not on file    Minutes per session: Not on file  . Stress: Not on file  Relationships  . Social connections:    Talks on phone: Not on file    Gets together: Not on file    Attends  religious service: Not on file    Active member of club or organization: Not on file    Attends meetings of clubs or organizations: Not on file    Relationship status: Not on file  . Intimate partner violence:    Fear of current or ex partner: Not on file    Emotionally abused: Not on file    Physically abused: Not on file    Forced sexual activity: Not on file  Other Topics Concern  . Not on file  Social History Narrative  . Not on file    Family History  Problem Relation Age of Onset  . Heart disease Father   . Heart failure Father   . Heart disease Mother   . CAD Mother     ROS: Problems with migraine headaches and sinusitis but no fevers or chills, productive cough, hemoptysis, dysphasia, odynophagia, melena, hematochezia, dysuria, hematuria, rash, seizure activity, orthopnea, PND, pedal edema, claudication. Remaining systems are negative.  Physical Exam:   Blood pressure 124/88, pulse 71, height 5\' 4"  (1.626 m), weight 144 lb 12.8 oz (65.7 kg).  General:  Well developed/well nourished in NAD Skin warm/dry Patient not depressed No peripheral clubbing Back-normal HEENT-normal/normal eyelids Neck supple/normal carotid upstroke bilaterally; no bruits; no JVD; no thyromegaly chest - CTA/ normal expansion CV - RRR/normal S1 and S2; no murmurs, rubs or gallops;  PMI nondisplaced Abdomen -NT/ND, no HSM, no mass, + bowel sounds, no bruit 2+ femoral pulses, no bruits Ext-no edema, chords, 2+ DP Neuro-grossly nonfocal  ECG - 12/05/17- atrial fibrillation with RVR; nonspecific ST changes; converted to sinus with prolonged QT; personally reviewed  Electrocardiogram today shows sinus rhythm with no ST changes.  A/P  1 paroxysmal atrial fibrillation-patient is in sinus rhythm today.  She now has documented atrial fibrillation.  She also has occasional palpitations described as a skip which may represent premature beats.  I will discontinue Cardizem and treat with Toprol 50 mg  daily to see if this improves her palpitations as well as control her rate with atrial fibrillation.  If she has more frequent episodes in the future we will consider an antiarrhythmic versus referral for ablation. CHADSvasc 1 for female sex.  We will therefore not treat with anticoagulation but I will ask her to take aspirin 81 mg daily.  Check echocardiogram for LV function.  2 history of mitral valve prolapse-plan echocardiogram to further assess.  3 hypothyroidism-management per primary care.  4 hyperlipidemia-check lipids.  Olga Millers, MD

## 2017-12-23 ENCOUNTER — Encounter: Payer: Self-pay | Admitting: Cardiology

## 2017-12-23 ENCOUNTER — Ambulatory Visit (INDEPENDENT_AMBULATORY_CARE_PROVIDER_SITE_OTHER): Payer: BLUE CROSS/BLUE SHIELD | Admitting: Cardiology

## 2017-12-23 VITALS — BP 124/88 | HR 71 | Ht 64.0 in | Wt 144.8 lb

## 2017-12-23 DIAGNOSIS — I341 Nonrheumatic mitral (valve) prolapse: Secondary | ICD-10-CM

## 2017-12-23 DIAGNOSIS — E7800 Pure hypercholesterolemia, unspecified: Secondary | ICD-10-CM | POA: Diagnosis not present

## 2017-12-23 DIAGNOSIS — I480 Paroxysmal atrial fibrillation: Secondary | ICD-10-CM | POA: Diagnosis not present

## 2017-12-23 MED ORDER — METOPROLOL SUCCINATE ER 25 MG PO TB24: 25.0000 mg | ORAL_TABLET | Freq: Every day | ORAL | 3 refills | Status: DC

## 2017-12-23 MED FILL — METOPROLOL SUCCINATE ER 25: 25 | 30 days supply | Qty: 30 | Fill #0

## 2017-12-23 NOTE — Patient Instructions (Signed)
Medication Instructions:   STOP DILTIAZEM  START METOPROLOL SUCC ER 25 MG ONCE DAILY AT BEDTIME  Labwork:  Your physician recommends that you HAVE LAB WORK TODAY  Testing/Procedures:  Your physician has requested that you have an echocardiogram. Echocardiography is a painless test that uses sound waves to create images of your heart. It provides your doctor with information about the size and shape of your heart and how well your heart's chambers and valves are working. This procedure takes approximately one hour. There are no restrictions for this procedure.    Follow-Up:  Your physician recommends that you schedule a follow-up appointment in: 3 MONTHS WITH DR Jens SomRENSHAW

## 2017-12-24 LAB — LIPID PANEL
CHOLESTEROL TOTAL: 228 mg/dL — AB (ref 100–199)
Chol/HDL Ratio: 2.2 ratio (ref 0.0–4.4)
HDL: 103 mg/dL (ref >39)
LDL CALC: 107 mg/dL — AB (ref 0–99)
TRIGLYCERIDES: 90 mg/dL (ref 0–149)
VLDL Cholesterol Cal: 18 mg/dL (ref 5–40)

## 2017-12-25 IMAGING — CT CT RENAL STONE PROTOCOL
2 of 4 series · 17 of 46 positions shown, 19 images · non-contrast
Comparison: CT scan of April 06, 2015.

CLINICAL DATA: Acute left-sided abdominal pain.

EXAM:
CT ABDOMEN AND PELVIS WITHOUT CONTRAST
TECHNIQUE: Multidetector CT imaging of the abdomen and pelvis was performed
following the standard protocol without IV contrast.

[Series 2: axial st · axial · 0.69mm/px · z∈[-471,-71]mm · 14 of 88 slices shown, 16 images]
[im 4/88  soft-tissue]
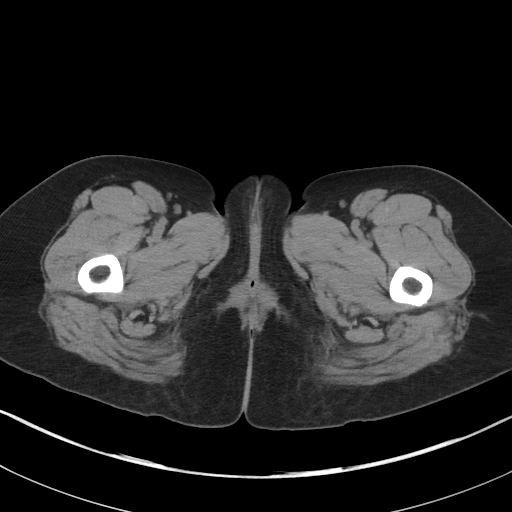
[im 4/88  bone]
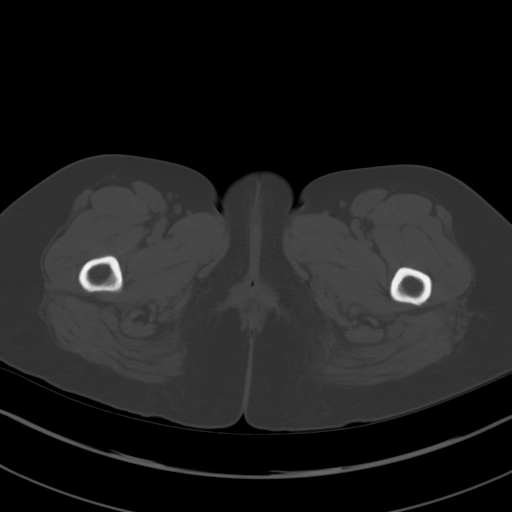
[im 11/88  soft-tissue]
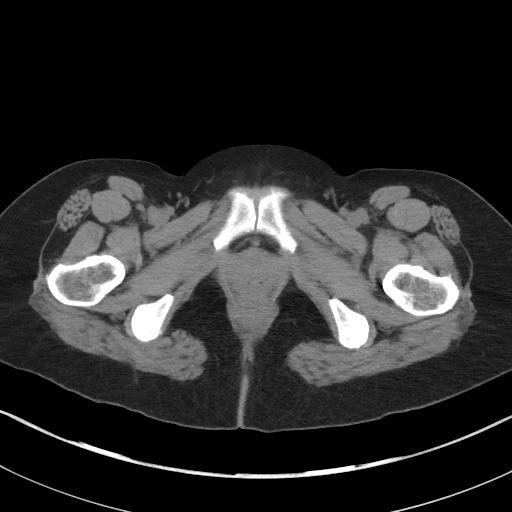
[im 19/88  soft-tissue]
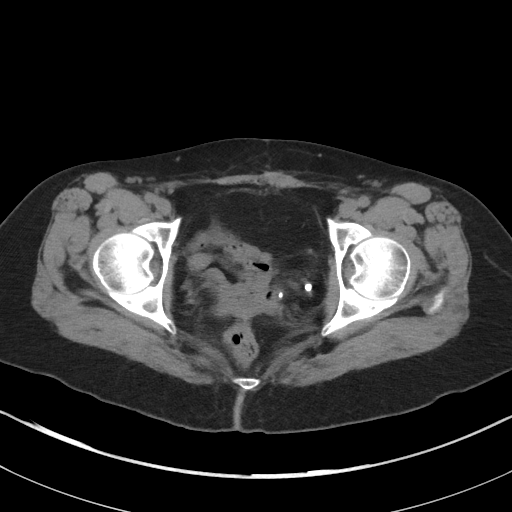
[im 22/88  soft-tissue]
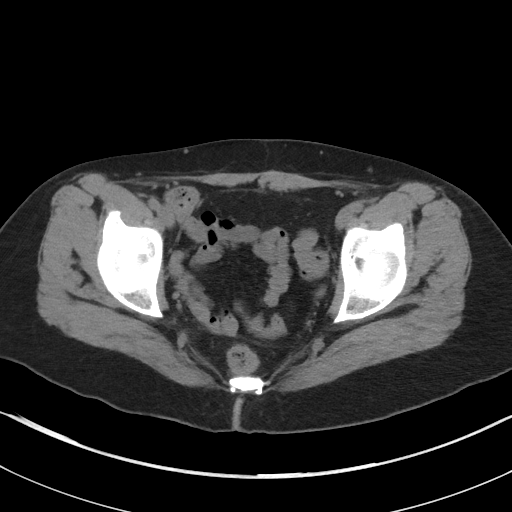
[im 30/88  soft-tissue]
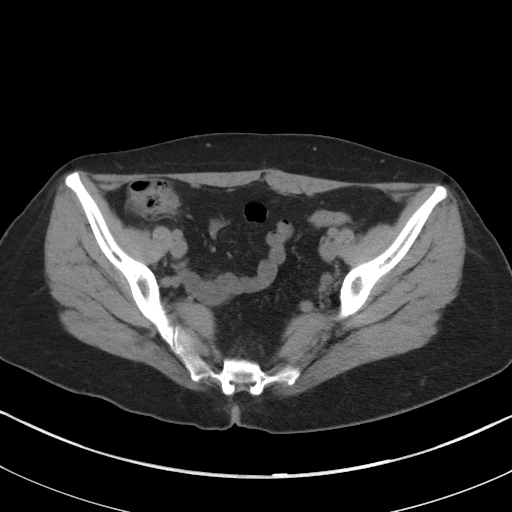
[im 37/88  soft-tissue]
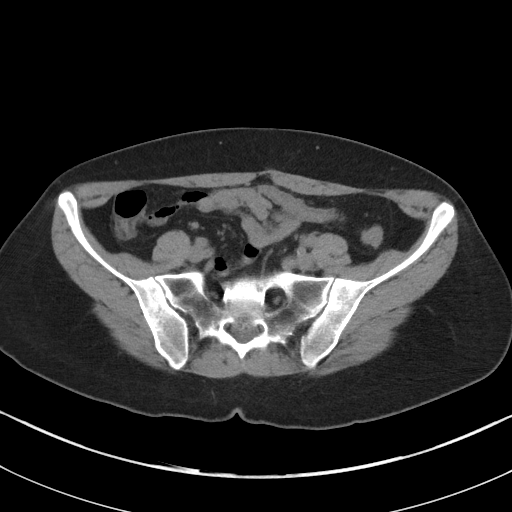
[im 40/88  soft-tissue]
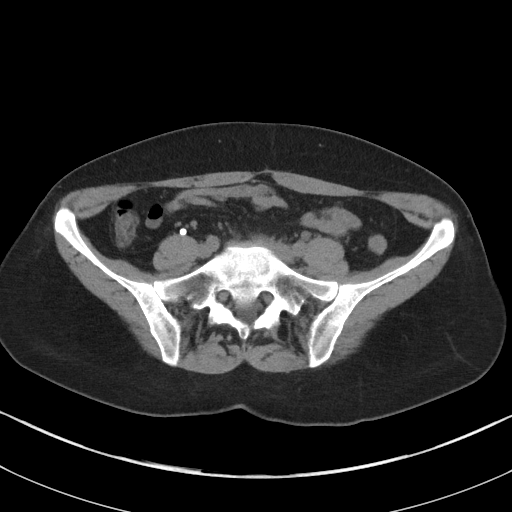
[im 48/88  soft-tissue]
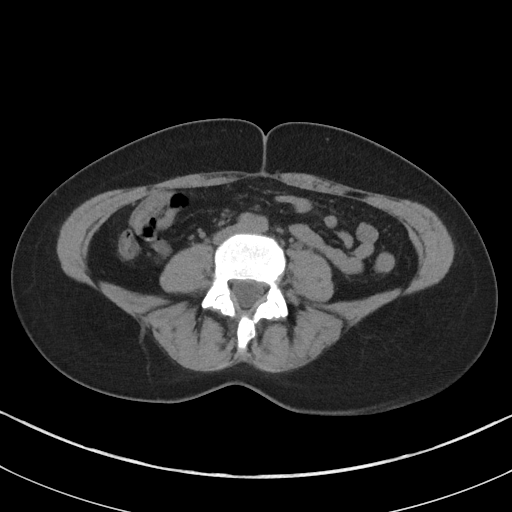
[im 51/88  soft-tissue]
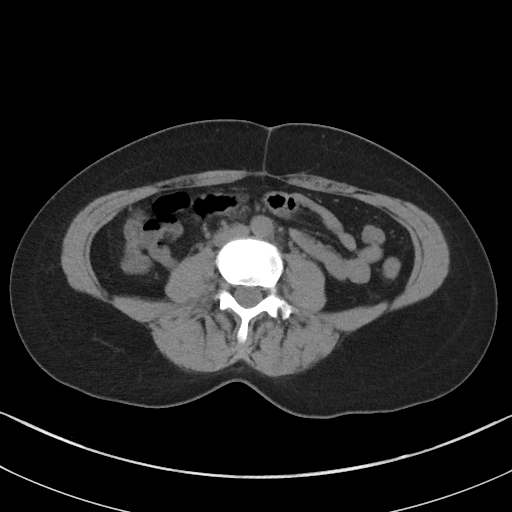
[im 51/88  bone]
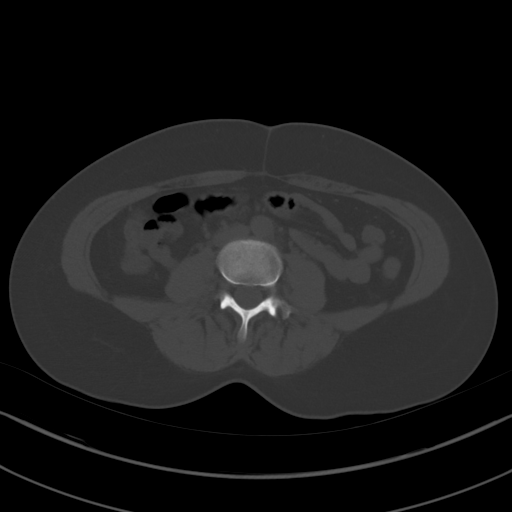
[im 59/88  soft-tissue]
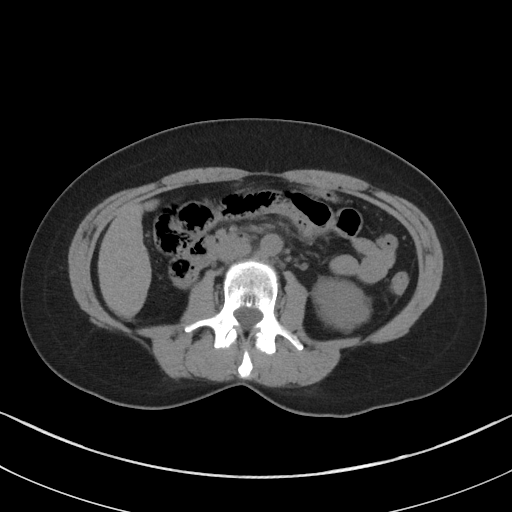
[im 66/88  soft-tissue]
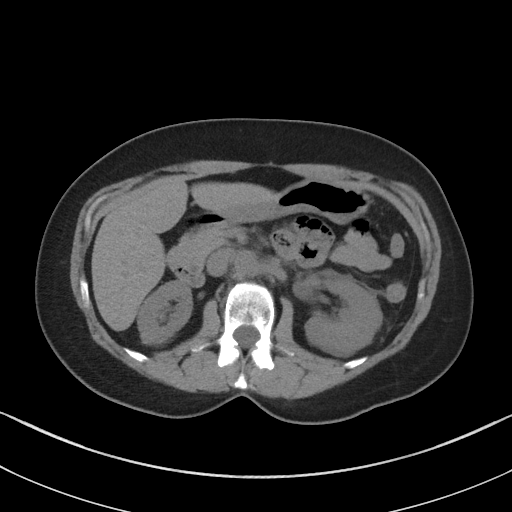
[im 69/88  soft-tissue]
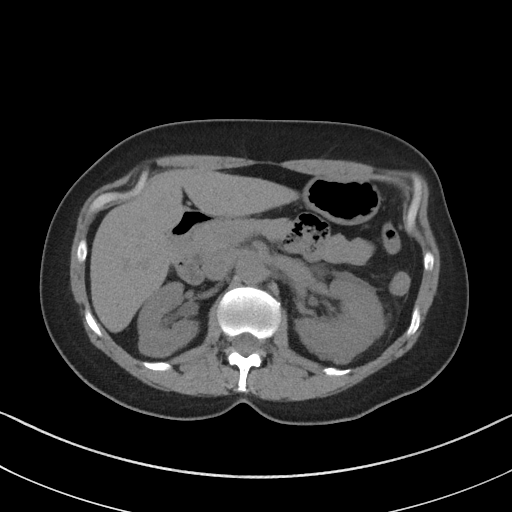
[im 77/88  soft-tissue]
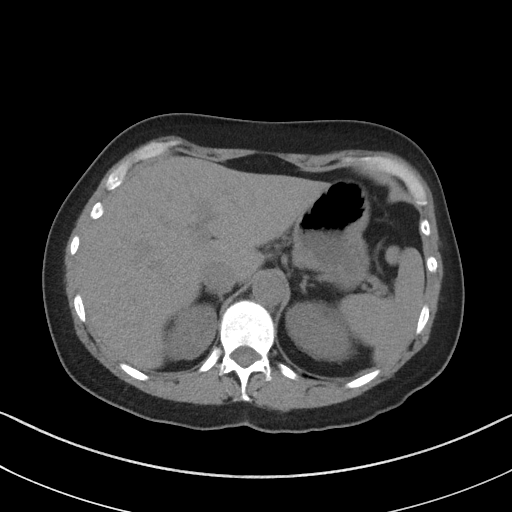
[im 84/88  soft-tissue]
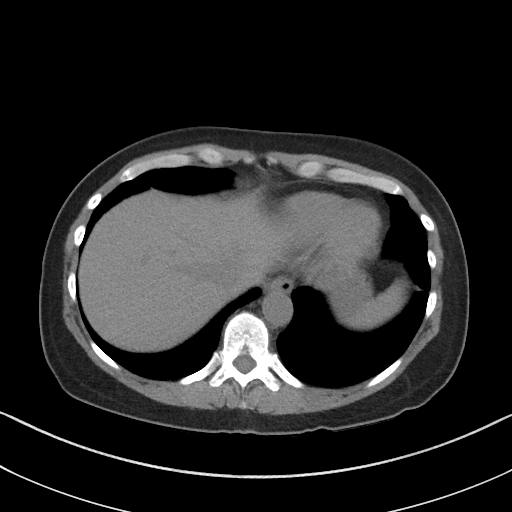

[Series 5: coronal st · coronal · 0.73mm/px · 3 of 71 slices shown]
[im 24/71  soft-tissue]
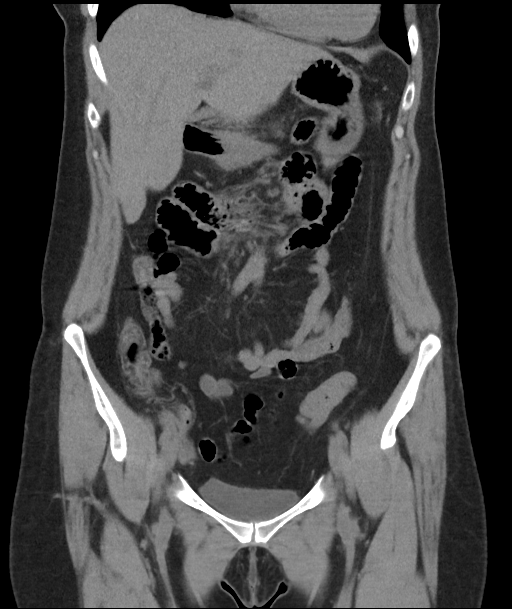
[im 32/71  soft-tissue]
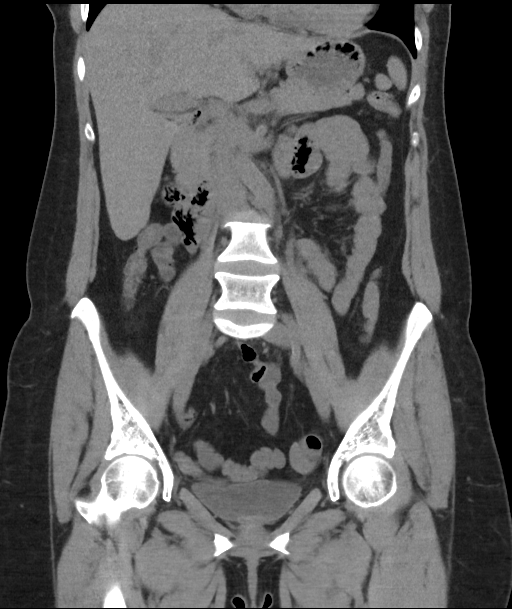
[im 39/71  soft-tissue]
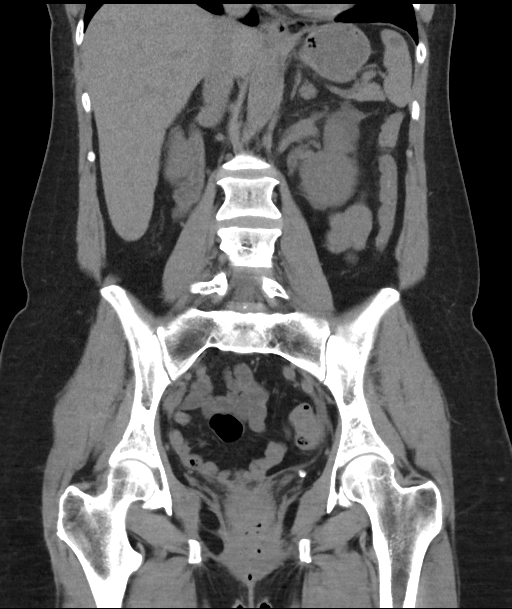

[17 of 46 positions shown; findings below may reference images not displayed]

FINDINGS: Visualized lung bases are unremarkable. No significant osseous
abnormality is noted.

Status post cholecystectomy. No focal abnormality is noted in the
liver, spleen or pancreas on these unenhanced images. Stable left
adrenal nodule is noted. Right adrenal gland appears normal. Minimal
bilateral nephrolithiasis is noted. Right kidney and ureter appear
normal. Mild left hydroureteronephrosis with perinephric stranding
is noted secondary to 3 mm calculus at the left ureterovesical
junction. There is no evidence of bowel obstruction. No abnormal
fluid collection is noted. No significant adenopathy is noted.
Urinary bladder appears normal. Status post hysterectomy. Ovaries
are not well visualized.
IMPRESSION: Stable left adrenal nodule.

Minimal bilateral nephrolithiasis. Mild left hydronephrosis is noted
with perinephric stranding secondary to 3 mm left ureterovesical
junction calculus.

## 2017-12-27 DIAGNOSIS — L03011 Cellulitis of right finger: Secondary | ICD-10-CM | POA: Diagnosis not present

## 2018-01-01 ENCOUNTER — Other Ambulatory Visit (HOSPITAL_BASED_OUTPATIENT_CLINIC_OR_DEPARTMENT_OTHER): Payer: BLUE CROSS/BLUE SHIELD

## 2018-01-01 DIAGNOSIS — I480 Paroxysmal atrial fibrillation: Secondary | ICD-10-CM | POA: Diagnosis not present

## 2018-01-01 DIAGNOSIS — G43709 Chronic migraine without aura, not intractable, without status migrainosus: Secondary | ICD-10-CM | POA: Diagnosis not present

## 2018-01-07 DIAGNOSIS — J301 Allergic rhinitis due to pollen: Secondary | ICD-10-CM | POA: Diagnosis not present

## 2018-01-08 ENCOUNTER — Ambulatory Visit (HOSPITAL_BASED_OUTPATIENT_CLINIC_OR_DEPARTMENT_OTHER)
Admission: RE | Admit: 2018-01-08 | Discharge: 2018-01-08 | Disposition: A | Discharge status: Home / Self Care | Payer: BLUE CROSS/BLUE SHIELD | Source: Ambulatory Visit | Attending: Cardiology | Admitting: Cardiology

## 2018-01-08 ENCOUNTER — Other Ambulatory Visit (HOSPITAL_BASED_OUTPATIENT_CLINIC_OR_DEPARTMENT_OTHER): Payer: BLUE CROSS/BLUE SHIELD

## 2018-01-08 DIAGNOSIS — E785 Hyperlipidemia, unspecified: Secondary | ICD-10-CM | POA: Diagnosis not present

## 2018-01-08 DIAGNOSIS — I081 Rheumatic disorders of both mitral and tricuspid valves: Secondary | ICD-10-CM | POA: Insufficient documentation

## 2018-01-08 DIAGNOSIS — I480 Paroxysmal atrial fibrillation: Secondary | ICD-10-CM | POA: Insufficient documentation

## 2018-01-08 NOTE — Progress Notes (Signed)
  Echocardiogram 2D Echocardiogram has been performed.  Seyon Strader T Juandaniel Manfredo 01/08/2018, 4:19 PM

## 2018-01-18 DIAGNOSIS — A4902 Methicillin resistant Staphylococcus aureus infection, unspecified site: Secondary | ICD-10-CM | POA: Diagnosis not present

## 2018-01-18 DIAGNOSIS — L03019 Cellulitis of unspecified finger: Secondary | ICD-10-CM | POA: Diagnosis not present

## 2018-02-02 DIAGNOSIS — B999 Unspecified infectious disease: Secondary | ICD-10-CM | POA: Diagnosis not present

## 2018-02-02 DIAGNOSIS — L409 Psoriasis, unspecified: Secondary | ICD-10-CM | POA: Diagnosis not present

## 2018-02-02 DIAGNOSIS — L603 Nail dystrophy: Secondary | ICD-10-CM | POA: Diagnosis not present

## 2018-02-02 DIAGNOSIS — D229 Melanocytic nevi, unspecified: Secondary | ICD-10-CM | POA: Diagnosis not present

## 2018-02-04 DIAGNOSIS — Z79899 Other long term (current) drug therapy: Secondary | ICD-10-CM | POA: Diagnosis not present

## 2018-02-04 DIAGNOSIS — L409 Psoriasis, unspecified: Secondary | ICD-10-CM | POA: Diagnosis not present

## 2018-02-12 DIAGNOSIS — H43392 Other vitreous opacities, left eye: Secondary | ICD-10-CM | POA: Diagnosis not present

## 2018-02-26 ENCOUNTER — Encounter: Payer: Self-pay | Admitting: Family Medicine

## 2018-02-26 ENCOUNTER — Ambulatory Visit (INDEPENDENT_AMBULATORY_CARE_PROVIDER_SITE_OTHER): Payer: BLUE CROSS/BLUE SHIELD | Admitting: Family Medicine

## 2018-02-26 VITALS — BP 110/80 | HR 67 | Temp 97.7°F | Ht 64.0 in | Wt 146.2 lb

## 2018-02-26 DIAGNOSIS — J301 Allergic rhinitis due to pollen: Secondary | ICD-10-CM | POA: Diagnosis not present

## 2018-02-26 DIAGNOSIS — Z23 Encounter for immunization: Secondary | ICD-10-CM

## 2018-02-26 DIAGNOSIS — I480 Paroxysmal atrial fibrillation: Secondary | ICD-10-CM

## 2018-02-26 DIAGNOSIS — E039 Hypothyroidism, unspecified: Secondary | ICD-10-CM

## 2018-02-26 LAB — BASIC METABOLIC PANEL
BUN: 17 mg/dL (ref 7–25)
CALCIUM: 8.7 mg/dL (ref 8.6–10.4)
CO2: 27 mmol/L (ref 20–32)
Chloride: 103 mmol/L (ref 98–110)
Creat: 0.97 mg/dL (ref 0.50–1.05)
GLUCOSE: 99 mg/dL (ref 65–99)
Potassium: 3.9 mmol/L (ref 3.5–5.3)
SODIUM: 139 mmol/L (ref 135–146)

## 2018-02-26 LAB — T4, FREE: FREE T4: 1.2 ng/dL (ref 0.8–1.8)

## 2018-02-26 LAB — TSH: TSH: 4.42 m[IU]/L

## 2018-02-26 NOTE — Progress Notes (Signed)
Christine Hendrix - 52 y.o. female MRN [id number]  Date of birth: 24-Dec-1965  Subjective Chief Complaint  Patient presents with  . Annual Exam    HPI Christine Hendrix is a very pleasant 52 y.o. female with history of chronic allergies, hypothyroidism, hld and OA here today for f/u of hypothyroidism.  She is familiar to me from my previous practice. Since the last time I saw her she was diagnosed with A. Fib.  She is being followed by cardiology and treated with Toprol XL.  She is doing well with this but does have occasional breakthrough palpitations. She denies anginal symptoms, shortness of breath, or dizziness.    -Hypothyroidism:  Compliant with levothyroxine.  Denies symptoms of hypo or hyperthyroidism.    -Chronic allergies:  Followed by allergist and receives allergy injections weekly.  She self administers these at home now.  She is not taking an antihistamine or nasal spray but does have these at home.   ROS:  A comprehensive ROS was completed and negative except as noted per HPI  Allergies  Allergen Reactions  . Ciprofloxacin Nausea Only  . Clindamycin Nausea Only  . Codeine Nausea And Vomiting  . Doxycycline Nausea Only  . Quinidine Nausea Only  . Quinolones Nausea And Vomiting  . Solifenacin Other (See Comments)    BLURRED VISION    Past Medical History:  Diagnosis Date  . Arthritis    neck  . Colitis    chronic   . Dental crowns present    also dental implant lower right  . Hyperlipidemia   . Hypothyroidism   . Migraines   . MVP (mitral valve prolapse)   . Nasal turbinate hypertrophy 04/2016  . Nephrolithiasis   . PONV (postoperative nausea and vomiting)   . Psoriasis   . TMJ syndrome    limited jaw opening    Past Surgical History:  Procedure Laterality Date  . ABDOMINAL HYSTERECTOMY     complete  . BUNIONECTOMY WITH HAMMERTOE RECONSTRUCTION Left 03/04/2016   4th/5th toes  . CHOLECYSTECTOMY    . TMJ ARTHROPLASTY    . TRIGEMINAL NERVE DECOMPRESSION    .  TURBINATE REDUCTION Bilateral 05/23/2016   Procedure: BILATERAL TURBINATE REDUCTION;  Surgeon: Newman Pies, MD;  Location: [facility];  Service: ENT;  Laterality: Bilateral;    Social History   Socioeconomic History  . Marital status: Married    Spouse name: Not on file  . Number of children: Not on file  . Years of education: Not on file  . 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 Financial resource strain: Not on file  . Food insecurity:    Worry: Not on file    Inability: Not on file  . Transportation needs:    Medical: Not on file    Non-medical: Not on file  Tobacco Use  . Smoking status: Never Smoker  . Smokeless tobacco: Never Used  Substance and Sexual Activity  . Alcohol use: No    Alcohol/week: 0.0 standard drinks  . Drug use: No  . Sexual activity: Not on file  Lifestyle  . Physical activity:    Days per week: Not on file    Minutes per session: Not on file  . Stress: Not on file  Relationships  . Social connections:    Talks on phone: Not on file    Gets together: Not on file    Attends religious service: Not  on file    Active member of club or organization: Not on file    Attends meetings of clubs or organizations: Not on file    Relationship status: Not on file  Other Topics Concern  . Not on file  Social History Narrative  . Not on file    Family History  Problem Relation Age of Onset  . Heart disease Father   . Heart failure Father   . Heart disease Mother   . CAD Mother     Health Maintenance  Topic Date Due  . PAP SMEAR  03/27/1986  . MAMMOGRAM  03/28/2015  . COLONOSCOPY  03/28/2015  . INFLUENZA VACCINE  03/10/2018 (Originally 12/24/2017)  . HIV Screening  02/27/2019 (Originally 03/27/1980)  . TETANUS/TDAP  05/26/2020     ----------------------------------------------------------------------------------------------------------------------------------------------------------------------------------------------------------------- Physical Exam BP 110/80   Pulse 67   Temp 97.7 F (36.5 C) (Oral)   Ht 5\' 4"  (1.626 m)   Wt 146 lb 3.2 oz (66.3 kg)   SpO2 98%   BMI 25.10 kg/m   Physical Exam  Constitutional: She is oriented to person, place, and time. She appears well-nourished. No distress.  HENT:  Head: Normocephalic and atraumatic.  Mouth/Throat: Oropharynx is clear and moist.  Eyes: No scleral icterus.  Neck: Neck supple. No thyromegaly present.  Cardiovascular: Normal rate, regular rhythm and normal heart sounds.  Pulmonary/Chest: Effort normal and breath sounds normal.  Musculoskeletal: She exhibits no tenderness.  Lymphadenopathy:    She has no cervical adenopathy.  Neurological: She is alert and oriented to person, place, and time.  Skin: Skin is warm and dry. No rash noted.  Psychiatric: She has a normal mood and affect. Her behavior is normal.    ------------------------------------------------------------------------------------------------------------------------------------------------------------------------------------------------------------------- Assessment and Plan  Hypothyroidism Stable symptoms Update TSH + Free T4 today.   PAF (paroxysmal atrial fibrillation) (HCC) Rate controlled with Toprol XL Low CHADS-VASC score of 1, anticoagulation not indicated at this time.  She will continue to follow with cardiology as well.   Allergic rhinitis Continues with allergy immunotherapy, doing well.  She'll continue this under care of her allergist.  May use steroid nasal spray and antihistamine. Discussed avoiding sudafed with a. Fib.

## 2018-02-26 NOTE — Assessment & Plan Note (Signed)
Continues with allergy immunotherapy, doing well.  She'll continue this under care of her allergist.  May use steroid nasal spray and antihistamine. Discussed avoiding sudafed with a. Fib.

## 2018-02-26 NOTE — Assessment & Plan Note (Signed)
Stable symptoms Update TSH + Free T4 today.

## 2018-02-26 NOTE — Patient Instructions (Signed)
It was great to see you again!   Thank you so much for the cake, it was very thoughtful!  We'll be in touch with lab results once they return.  These will also be released through MyChart  I will see you back in about 6 months with me or sooner if needed.

## 2018-02-26 NOTE — Assessment & Plan Note (Signed)
Rate controlled with Toprol XL Low CHADS-VASC score of 1, anticoagulation not indicated at this time.  She will continue to follow with cardiology as well.

## 2018-03-03 ENCOUNTER — Other Ambulatory Visit: Payer: Self-pay | Admitting: Family Medicine

## 2018-03-03 MED ORDER — LEVOTHYROXINE SODIUM 75 MCG PO TABS: 75.0000 ug | ORAL_TABLET | Freq: Every day | ORAL | 2 refills | Status: DC

## 2018-03-03 NOTE — Progress Notes (Signed)
Completed.

## 2018-03-10 ENCOUNTER — Encounter: Payer: Self-pay | Admitting: Family Medicine

## 2018-03-11 ENCOUNTER — Other Ambulatory Visit: Payer: Self-pay | Admitting: Emergency Medicine

## 2018-03-11 MED ORDER — LEVOTHYROXINE SODIUM 50 MCG PO TABS: 50.0000 ug | ORAL_TABLET | Freq: Every day | ORAL | 1 refills | Status: DC

## 2018-03-11 NOTE — Telephone Encounter (Signed)
Spoke with patient and she confirmed that she is taking 50 mcg. Refill has been sent to patient pharmacy

## 2018-03-17 NOTE — Progress Notes (Signed)
HPI: FU atrial fibrillation. H/O MVP. Seen in ER 12/05/17; with CP, palpitations and dyspnea; in atrial fibrillation; chest xray neg; TSH 2.458, K 3, Cr .72, Hgb 14.3, DDimer neg. Converted to sinus with cardizem.   Echocardiogram August 2019 showed normal LV function, mild prolapse of the posterior mitral valve leaflet and mild mitral regurgitation.  Since last seen she occasionally has palpitations described as a skip but no sustained palpitations similar to her atrial fibrillation.  She has the sensation of dyspnea when she has skips and chest heaviness but otherwise no exertional chest pain or syncope.  Current Outpatient Medications  Medication Sig Dispense Refill  . Erenumab-aooe (AIMOVIG) 140 MG/ML SOAJ Inject into the skin.    Marland Kitchen estrogens, conjugated, (PREMARIN) 1.25 MG tablet Take 1.25 mg by mouth daily.    Marland Kitchen levothyroxine (SYNTHROID, LEVOTHROID) 50 MCG tablet Take 1 tablet (50 mcg total) by mouth daily. 90 tablet 1  . metoprolol succinate (TOPROL XL) 25 MG 24 hr tablet Take 1 tablet (25 mg total) by mouth daily. 90 tablet 3   No current facility-administered medications for this visit.      Past Medical History:  Diagnosis Date  . Arthritis    neck  . Colitis    chronic   . Dental crowns present    also dental implant lower right  . Hyperlipidemia   . Hypothyroidism   . Migraines   . MVP (mitral valve prolapse)   . Nasal turbinate hypertrophy 04/2016  . Nephrolithiasis   . PONV (postoperative nausea and vomiting)   . Psoriasis   . TMJ syndrome    limited jaw opening    Past Surgical History:  Procedure Laterality Date  . ABDOMINAL HYSTERECTOMY     complete  . BUNIONECTOMY WITH HAMMERTOE RECONSTRUCTION Left 03/04/2016   4th/5th toes  . CHOLECYSTECTOMY    . TMJ ARTHROPLASTY    . TRIGEMINAL NERVE DECOMPRESSION    . TURBINATE REDUCTION Bilateral 05/23/2016   Procedure: BILATERAL TURBINATE REDUCTION;  Surgeon: Newman Pies, MD;  Location: [facility];   Service: ENT;  Laterality: Bilateral;    Social History   Socioeconomic History  . Marital status: Married    Spouse name: Not on file  . Number of children: Not on file  . Years of education: Not on file  . 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 Financial resource strain: Not on file  . Food insecurity:    Worry: Not on file    Inability: Not on file  . Transportation needs:    Medical: Not on file    Non-medical: Not on file  Tobacco Use  . Smoking status: Never Smoker  . Smokeless tobacco: Never Used  Substance and Sexual Activity  . Alcohol use: No    Alcohol/week: 0.0 standard drinks  . Drug use: No  . Sexual activity: Not on file  Lifestyle  . Physical activity:    Days per week: Not on file    Minutes per session: Not on file  . Stress: Not on file  Relationships  . Social connections:    Talks on phone: Not on file    Gets together: Not on file    Attends religious service: Not on file    Active member of club or organization: Not on file    Attends meetings of clubs or organizations: Not on file    Relationship status:  Not on file  . Intimate partner violence:    Fear of current or ex partner: Not on file    Emotionally abused: Not on file    Physically abused: Not on file    Forced sexual activity: Not on file  Other Topics Concern  . Not on file  Social History Narrative  . Not on file    Family History  Problem Relation Age of Onset  . Heart disease Father   . Heart failure Father   . Heart disease Mother   . CAD Mother     ROS: no fevers or chills, productive cough, hemoptysis, dysphasia, odynophagia, melena, hematochezia, dysuria, hematuria, rash, seizure activity, orthopnea, PND, pedal edema, claudication. Remaining systems are negative.  Physical Exam: Well-developed well-nourished in no acute distress.  Skin is warm and dry.  She has noticed worsening of her  psoriasis HEENT is normal.  Neck is supple.  Chest is clear to auscultation with normal expansion.  Cardiovascular exam is regular rate and rhythm.  Abdominal exam nontender or distended. No masses palpated. Extremities show no edema. neuro grossly intact  A/P  1 paroxysmal atrial fibrillation-patient is in sinus rhythm on examination today.  Patient has developed worsening psoriasis and her dermatologist feels it is related to the Toprol.  We will discontinue and instead treat with Cardizem CD 120 mg daily.  I have explained that her palpitations/PVCs may worsen with this but hopefully it will improve her rash.  We will consider addition of antiarrhythmic versus referral for ablation if she has more frequent episodes in the future.  I have elected not to anticoagulate long-term as her CHADSvasc is 1 for female sex.  Continue aspirin 81 mg daily.  2 mitral valve prolapse-noted on echocardiogram.  Mild mitral regurgitation.  Plan follow-up studies in the future.  3 hypothyroidism-managed by primary care.  4 hyperlipidemia-last LDL 107.  Continue diet.  Olga Millers, MD

## 2018-03-23 DIAGNOSIS — L400 Psoriasis vulgaris: Secondary | ICD-10-CM | POA: Diagnosis not present

## 2018-03-24 ENCOUNTER — Encounter: Payer: Self-pay | Admitting: Cardiology

## 2018-03-24 ENCOUNTER — Ambulatory Visit (INDEPENDENT_AMBULATORY_CARE_PROVIDER_SITE_OTHER): Payer: BLUE CROSS/BLUE SHIELD | Admitting: Cardiology

## 2018-03-24 VITALS — BP 124/86 | HR 61 | Ht 64.0 in | Wt 147.8 lb

## 2018-03-24 DIAGNOSIS — E7800 Pure hypercholesterolemia, unspecified: Secondary | ICD-10-CM | POA: Diagnosis not present

## 2018-03-24 DIAGNOSIS — I480 Paroxysmal atrial fibrillation: Secondary | ICD-10-CM

## 2018-03-24 DIAGNOSIS — I341 Nonrheumatic mitral (valve) prolapse: Secondary | ICD-10-CM | POA: Diagnosis not present

## 2018-03-24 MED ORDER — DILTIAZEM HCL ER COATED BEADS 120 MG PO CP24: 120.0000 mg | ORAL_CAPSULE | Freq: Every day | ORAL | 3 refills | Status: DC

## 2018-03-24 MED ORDER — ASPIRIN EC 81 MG PO TBEC: 81.0000 mg | DELAYED_RELEASE_TABLET | Freq: Every day | ORAL | 3 refills | Status: AC

## 2018-03-24 NOTE — Patient Instructions (Signed)
Medication Instructions:   STOP METOPROLOL  START DILTIAZEM 120 MG ONCE DAILY  Follow-Up:  Your physician recommends that you schedule a follow-up appointment in: 6 MONTHS WITH DR CRENSHAW  PLEASE GIVE OUR OFFICE A CALL 2 MONTHS PRIOR TO THAT APPOINTMENT TIME TO SCHEDULE

## 2018-03-30 ENCOUNTER — Telehealth: Payer: Self-pay | Admitting: Cardiology

## 2018-03-30 NOTE — Telephone Encounter (Signed)
Spoke to patient. Started medication about  week ago. She states since starting Diltiazem 120 mg ( takes in the evening) - legs and feet - having been tingling ,restless, aching every night. She states it occurs while she is sleeping and last throughout the morning (lasting8-10 hours). No swelling or redness noted, Also patient states she has become very short of breathe climbing any set of stairs - patient is concern - her last appointment 10.30.19 Aware will defer to Dr Jens Som

## 2018-03-30 NOTE — Telephone Encounter (Signed)
Schedule fuov with me or PA Marites Nath  

## 2018-03-30 NOTE — Telephone Encounter (Signed)
Spoke to patient. Information given. offred patient appointment for this weEk , patient decline for appointment with DR CRENSHAW ON 04/06/18 AT 2:40 PM. ADDRESS GIVEN PATIENT VERBALIZED UNDERSTANDING

## 2018-03-30 NOTE — Telephone Encounter (Signed)
LEFT MESSAGE TO CALL BACK

## 2018-03-30 NOTE — Telephone Encounter (Signed)
Follow up:   Patient returning call back. Patient states a voice mail can be left.

## 2018-03-30 NOTE — Telephone Encounter (Signed)
New Message   Pt c/o medication issue:  1. Name of Medication:diltiazem (CARDIZEM CD) 120 MG 24 hr capsule  2. How are you currently taking this medication (dosage and times per day)? Take 1 capsule (120 mg total) by mouth daily.  3. Are you having a reaction (difficulty breathing--STAT)?   4. What is your medication issue? Shortness of breath upon going up the steps. Legs and feet tingle all night long and preventing patient from sleepy.

## 2018-04-06 ENCOUNTER — Ambulatory Visit: Payer: BLUE CROSS/BLUE SHIELD | Admitting: Cardiology

## 2018-04-29 DIAGNOSIS — J301 Allergic rhinitis due to pollen: Secondary | ICD-10-CM | POA: Diagnosis not present

## 2018-05-07 ENCOUNTER — Encounter: Payer: Self-pay | Admitting: Family Medicine

## 2018-05-07 ENCOUNTER — Ambulatory Visit (INDEPENDENT_AMBULATORY_CARE_PROVIDER_SITE_OTHER): Payer: BLUE CROSS/BLUE SHIELD | Admitting: Family Medicine

## 2018-05-07 VITALS — BP 112/63 | HR 74 | Temp 97.7°F | Ht 64.0 in | Wt 143.0 lb

## 2018-05-07 DIAGNOSIS — G43819 Other migraine, intractable, without status migrainosus: Secondary | ICD-10-CM | POA: Diagnosis not present

## 2018-05-07 DIAGNOSIS — J0110 Acute frontal sinusitis, unspecified: Secondary | ICD-10-CM | POA: Diagnosis not present

## 2018-05-07 MED ORDER — CEFDINIR 300 MG PO CAPS: 300.0000 mg | ORAL_CAPSULE | Freq: Two times a day (BID) | ORAL | 0 refills | Status: AC

## 2018-05-07 MED ORDER — METHYLPREDNISOLONE ACETATE 80 MG/ML IJ SUSP
80.0000 mg | Freq: Once | INTRAMUSCULAR | Status: AC
  Administered 2018-05-07: 80 mg via INTRAMUSCULAR

## 2018-05-07 MED ORDER — DEXAMETHASONE SODIUM PHOSPHATE 10 MG/ML IJ SOLN: 10.0000 mg | Freq: Once | INTRAMUSCULAR | Status: DC

## 2018-05-07 NOTE — Progress Notes (Signed)
Christine Hendrix Kitchen  Christine Hendrix - 52 y.o. female MRN [id number]  Date of birth: [birth year]  Subjective Chief Complaint  Patient presents with  . Sinusitis    completed augmentin last week with no improvement    HPI Christine Hendrix is a 52 y.o. female here today with complaint of sinus pressure and pain, ear pressure, fatigue and headaches.  She reports initial onset of symptoms was around 4 weeks ago.  She utilized a Electrical engineer and was sent in rx for augmentin however did not have improvement with this.  She typically requires a steroid for her sinusitis in addition to antibiotic.  She denies fever, chills, chest pain, shortness of breath, or rash.  ROS:  A comprehensive ROS was completed and negative except as noted per HPI  Allergies  Allergen Reactions  . Amoxicillin-Pot Clavulanate Nausea Only and Other (See Comments)  . Ciprofloxacin Nausea Only  . Clindamycin Nausea Only and Other (See Comments)  . Codeine Nausea And Vomiting, Other (See Comments) and Rash    Vomiting and dizziness  . Doxycycline Nausea Only and Other (See Comments)  . Quinidine Nausea Only and Other (See Comments)  . Quinine Other (See Comments)  . Quinolones Nausea And Vomiting and Other (See Comments)    Nausea, Dizziness Nausea, Dizziness Nausea, Dizziness   . Solifenacin Other (See Comments)    BLURRED VISION Other reaction(s): Hallucinations Visual disturbances    Past Medical History:  Diagnosis Date  . Arthritis    neck  . Colitis    chronic   . Dental crowns present    also dental implant lower right  . Hyperlipidemia   . Hypothyroidism   . Migraines   . MVP (mitral valve prolapse)   . Nasal turbinate hypertrophy 04/2016  . Nephrolithiasis   . PONV (postoperative nausea and vomiting)   . Psoriasis   . TMJ syndrome    limited jaw opening    Past Surgical History:  Procedure Laterality Date  . ABDOMINAL HYSTERECTOMY     complete  . BUNIONECTOMY WITH HAMMERTOE RECONSTRUCTION Left 03/04/2016   4th/5th toes  . CHOLECYSTECTOMY    . TMJ ARTHROPLASTY    . TRIGEMINAL NERVE DECOMPRESSION    . TURBINATE REDUCTION Bilateral 05/23/2016   Procedure: BILATERAL TURBINATE REDUCTION;  Surgeon: Newman Pies, MD;  Location: [facility];  Service: ENT;  Laterality: Bilateral;    Social History   Socioeconomic History  . Marital status: Married    Spouse name: Not on file  . Number of children: Not on file  . Years of education: Not on file  . 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 Financial resource strain: Not on file  . Food insecurity:    Worry: Not on file    Inability: Not on file  . Transportation needs:    Medical: Not on file    Non-medical: Not on file  Tobacco Use  . Smoking status: Never Smoker  . Smokeless tobacco: Never Used  Substance and Sexual Activity  . Alcohol use: No    Alcohol/week: 0.0 standard drinks  . Drug use: No  . Sexual activity: Not on file  Lifestyle  . Physical activity:    Days per week: Not on file    Minutes per session: Not on file  . Stress: Not on file  Relationships  . Social connections:    Talks on phone: Not on file    Gets  together: Not on file    Attends religious service: Not on file    Active member of club or organization: Not on file    Attends meetings of clubs or organizations: Not on file    Relationship status: Not on file  Other Topics Concern  . Not on file  Social History Narrative  . Not on file    Family History  Problem Relation Age of Onset  . Heart disease Father   . Heart failure Father   . Heart disease Mother   . CAD Mother     Health Maintenance  Topic Date Due  . PAP SMEAR-Modifier  03/27/1986  . MAMMOGRAM  03/28/2015  . COLONOSCOPY  03/28/2015  . HIV Screening  02/27/2019 (Originally 03/27/1980)  . TETANUS/TDAP  05/26/2020  . INFLUENZA VACCINE  Completed     ----------------------------------------------------------------------------------------------------------------------------------------------------------------------------------------------------------------- Physical Exam BP 112/63   Pulse 74   Temp 97.7 F (36.5 C) (Oral)   Ht 5\' 4"  (1.626 m)   Wt 143 lb (64.9 kg)   SpO2 97%   BMI 24.55 kg/m   Physical Exam Constitutional:      Appearance: She is ill-appearing. She is not toxic-appearing.  HENT:     Head: Normocephalic and atraumatic.     Right Ear: Tympanic membrane normal.     Left Ear: Tympanic membrane normal.     Nose: Congestion present.     Comments: +frontal sinus tenderness     Mouth/Throat:     Mouth: Mucous membranes are moist.  Eyes:     General: No scleral icterus. Neck:     Musculoskeletal: Normal range of motion and neck supple.  Cardiovascular:     Rate and Rhythm: Normal rate and regular rhythm.  Pulmonary:     Effort: Pulmonary effort is normal.     Breath sounds: Normal breath sounds.  Skin:    General: Skin is warm and dry.  Neurological:     General: No focal deficit present.     Mental Status: She is alert.  Psychiatric:        Mood and Affect: Mood normal.        Behavior: Behavior normal.     ------------------------------------------------------------------------------------------------------------------------------------------------------------------------------------------------------------------- Assessment and Plan  Acute non-recurrent frontal sinusitis -Injection of depo-medrol 80mg  givne in clinic today -Rx for omnicef -Increased fluids and rest.  -Call if not improving.

## 2018-05-07 NOTE — Patient Instructions (Signed)
-  Hope you feel better soon -You received a steroid injection today -Start prescription for cefdinir  -Let me know if your symptoms are not improving -I hope you and your family have a Merry Christmas and Happy New Year!!

## 2018-05-07 NOTE — Assessment & Plan Note (Signed)
-  Injection of depo-medrol 80mg  givne in clinic today -Rx for omnicef -Increased fluids and rest.  -Call if not improving.

## 2018-05-13 DIAGNOSIS — R0981 Nasal congestion: Secondary | ICD-10-CM | POA: Diagnosis not present

## 2018-05-13 DIAGNOSIS — R0683 Snoring: Secondary | ICD-10-CM | POA: Diagnosis not present

## 2018-05-13 DIAGNOSIS — H9201 Otalgia, right ear: Secondary | ICD-10-CM | POA: Diagnosis not present

## 2018-05-14 ENCOUNTER — Ambulatory Visit: Payer: Self-pay

## 2018-05-14 ENCOUNTER — Other Ambulatory Visit: Payer: Self-pay | Admitting: Family Medicine

## 2018-05-14 MED ORDER — PREDNISONE 10 MG (21) PO TBPK: ORAL_TABLET | ORAL | 0 refills | Status: DC

## 2018-05-14 NOTE — Telephone Encounter (Signed)
Dr. Matthews - please advise.  

## 2018-05-14 NOTE — Telephone Encounter (Signed)
FYI: Called and spoke with patient.  Had some relief after depo-medrol injection in clinic, will send in sterapred pack.  She will let me know if not improving early next week.

## 2018-05-14 NOTE — Telephone Encounter (Signed)
Pt stated she will try it if Dr. Ashley RoyaltyMatthews thinks it will help. Pt was wondering what Dr. Ashley RoyaltyMatthews thinks about prednisone? Please advise,   FYI--pt stated she took Cefdinir with food last week and she still feel nausea.

## 2018-05-14 NOTE — Telephone Encounter (Signed)
I would recommend doxycycline or levaquin but I see she has nausea with these.  Would she be willing to try either one of these again?

## 2018-05-14 NOTE — Telephone Encounter (Signed)
Pt. Reports she is still having sinus congestion and "horrible sinus headaches." Has white discharge from nose. Right ear is painful on and off. "I can feel fluid in it sometimes." Finishes antibiotic today. "I'm tired of being sick for so long.I really don't feel like coming in today if I don't have to." Would like additional medication sent in. Please advise pt.  Answer Assessment - Initial Assessment Questions 1. LOCATION: "Where does it hurt?"      Forehead 2. ONSET: "When did the sinus pain start?"  (e.g., hours, days)      6 weeks ago 3. SEVERITY: "How bad is the pain?"   (Scale 1-10; mild, moderate or severe)   - MILD (1-3): doesn't interfere with normal activities    - MODERATE (4-7): interferes with normal activities (e.g., work or school) or awakens from sleep   - SEVERE (8-10): excruciating pain and patient unable to do any normal activities        Moderate 4. RECURRENT SYMPTOM: "Have you ever had sinus problems before?" If so, ask: "When was the last time?" and "What happened that time?"      Yes 5. NASAL CONGESTION: "Is the nose blocked?" If so, ask, "Can you open it or must you breathe through the mouth?"     Stuffed up 6. NASAL DISCHARGE: "Do you have discharge from your nose?" If so ask, "What color?"     White 7. FEVER: "Do you have a fever?" If so, ask: "What is it, how was it measured, and when did it start?"      No 8. OTHER SYMPTOMS: "Do you have any other symptoms?" (e.g., sore throat, cough, earache, difficulty breathing)     Right ear painful on and off  9. PREGNANCY: "Is there any chance you are pregnant?" "When was your last menstrual period?"     No  Protocols used: SINUS PAIN OR CONGESTION-A-AH

## 2018-06-11 DIAGNOSIS — R040 Epistaxis: Secondary | ICD-10-CM | POA: Diagnosis not present

## 2018-07-02 DIAGNOSIS — Z1239 Encounter for other screening for malignant neoplasm of breast: Secondary | ICD-10-CM | POA: Diagnosis not present

## 2018-07-02 DIAGNOSIS — Z1231 Encounter for screening mammogram for malignant neoplasm of breast: Secondary | ICD-10-CM | POA: Diagnosis not present

## 2018-07-02 LAB — HM MAMMOGRAPHY

## 2018-07-05 ENCOUNTER — Encounter: Payer: Self-pay | Admitting: Family Medicine

## 2018-07-16 DIAGNOSIS — L408 Other psoriasis: Secondary | ICD-10-CM | POA: Diagnosis not present

## 2018-07-19 DIAGNOSIS — R922 Inconclusive mammogram: Secondary | ICD-10-CM | POA: Diagnosis not present

## 2018-07-19 DIAGNOSIS — N6002 Solitary cyst of left breast: Secondary | ICD-10-CM | POA: Diagnosis not present

## 2018-07-19 DIAGNOSIS — R928 Other abnormal and inconclusive findings on diagnostic imaging of breast: Secondary | ICD-10-CM | POA: Diagnosis not present

## 2018-08-12 DIAGNOSIS — J301 Allergic rhinitis due to pollen: Secondary | ICD-10-CM | POA: Diagnosis not present

## 2018-08-16 DIAGNOSIS — J301 Allergic rhinitis due to pollen: Secondary | ICD-10-CM | POA: Diagnosis not present

## 2018-09-07 ENCOUNTER — Other Ambulatory Visit: Payer: Self-pay | Admitting: Family Medicine

## 2018-09-16 NOTE — Progress Notes (Signed)
Virtual Visit via Video Note   This visit type was conducted due to national recommendations for restrictions regarding the COVID-19 Pandemic (e.g. social distancing) in an effort to limit this patient's exposure and mitigate transmission in our community.  Due to her co-morbid illnesses, this patient is at least at moderate risk for complications without adequate follow up.  This format is felt to be most appropriate for this patient at this time.  All issues noted in this document were discussed and addressed.  A limited physical exam was performed with this format.  Please refer to the patient's chart for her consent to [facility].   Evaluation Performed:  Follow-up visit  Date:  09/22/2018   ID:  Christine Hendrix, DOB 11/29/1965, MRN [id number]  Patient Location: Home Provider Location: Home  PCP:  Everrett CoombeMatthews, Cody, DO  Cardiologist:  Olga MillersBrian Crenshaw MD  Chief Complaint:  Atrial fibrillation   History of Present Illness:    FU atrial fibrillation. H/O MVP. Seen in ER 12/05/17; with CP, palpitations and dyspnea; in atrial fibrillation; chest xray neg; TSH 2.458, Cr .72, Hgb 14.3. Converted to sinus with cardizem.  Echocardiogram August 2019 showed normal LV function, mild prolapse of the posterior mitral valve leaflet and mild mitral regurgitation.  Since last seen patient has occasional palpitations but nothing similar to previous episodes of atrial fibrillation.  She has occasional dyspnea on exertion.  She denies chest pain or syncope.  The patient does not have symptoms concerning for COVID-19 infection (fever, chills, cough, or new shortness of breath).    Past Medical History:  Diagnosis Date  . Arthritis    neck  . Colitis    chronic   . Dental crowns present    also dental implant lower right  . Hyperlipidemia   . Hypothyroidism   . Migraines   . MVP (mitral valve prolapse)   . Nasal turbinate hypertrophy 04/2016  . Nephrolithiasis   . PONV  (postoperative nausea and vomiting)   . Psoriasis   . TMJ syndrome    limited jaw opening   Past Surgical History:  Procedure Laterality Date  . ABDOMINAL HYSTERECTOMY     complete  . BUNIONECTOMY WITH HAMMERTOE RECONSTRUCTION Left 03/04/2016   4th/5th toes  . CHOLECYSTECTOMY    . TMJ ARTHROPLASTY    . TRIGEMINAL NERVE DECOMPRESSION    . TURBINATE REDUCTION Bilateral 05/23/2016   Procedure: BILATERAL TURBINATE REDUCTION;  Surgeon: Newman PiesSu Teoh, MD;  Location: [facility];  Service: ENT;  Laterality: Bilateral;     Current Meds  Medication Sig  . aspirin EC 81 MG tablet Take 1 tablet (81 mg total) by mouth daily.  Marland Kitchen. diltiazem (CARDIZEM CD) 120 MG 24 hr capsule Take 1 capsule (120 mg total) by mouth daily.  Dorise Hiss. Erenumab-aooe (AIMOVIG) 140 MG/ML SOAJ Inject into the skin.  Marland Kitchen. estrogens, conjugated, (PREMARIN) 1.25 MG tablet Take 1.25 mg by mouth daily.  Marland Kitchen. ibuprofen (ADVIL) 800 MG tablet Take 800 mg by mouth every 8 (eight) hours as needed for headache.  . levothyroxine (SYNTHROID, LEVOTHROID) 50 MCG tablet TAKE 1 TABLET DAILY     Allergies:   Amoxicillin-pot clavulanate; Ciprofloxacin; Clindamycin; Codeine; Doxycycline; Quinidine; Quinine; Quinolones; and Solifenacin   Social History   Tobacco Use  . Smoking status: Never Smoker  . Smokeless tobacco: Never Used  Substance Use Topics  . Alcohol use: No    Alcohol/week: 0.0 standard drinks  . Drug use: No     Family Hx: The  patient's family history includes CAD in her mother; Heart disease in her father and mother; Heart failure in her father.  ROS:   Please see the history of present illness.    Patient denies fevers, chills or productive cough.  Intermittent migraines.  Rash related to psoriasis. All other systems reviewed and are negative.   Recent Labs: 12/05/2017: Hemoglobin 14.3; Magnesium 1.8; Platelets 268 02/26/2018: BUN 17; Creat 0.97; Potassium 3.9; Sodium 139; TSH 4.42   Recent Lipid Panel Lab Results   Component Value Date/Time   CHOL 228 (H) 12/23/2017 08:50 AM   TRIG 90 12/23/2017 08:50 AM   HDL 103 12/23/2017 08:50 AM   CHOLHDL 2.2 12/23/2017 08:50 AM   LDLCALC 107 (H) 12/23/2017 08:50 AM    Wt Readings from Last 3 Encounters:  09/22/18 138 lb (62.6 kg)  05/07/18 143 lb (64.9 kg)  03/24/18 147 lb 12.8 oz (67 kg)     Objective:    Vital Signs:  Ht 5\' 4"  (1.626 m)   Wt 138 lb (62.6 kg) Comment: Patient stated her weight  BMI 23.69 kg/m    VITAL SIGNS:  reviewed  Patient answers questions appropriately. Normal affect. No acute distress. Remainder of physical examination not performed (telehealth visit; coronavirus pandemic)  ASSESSMENT & PLAN:    1. Paroxysmal atrial fibrillation-patient has had no recurrences by her report.  We changed her Toprol to Cardizem previously as she felt the beta-blocker was exacerbating her psoriasis.  She now wonders if Cardizem may be exacerbating her psoriasis as well.  I have asked her to discuss this with her dermatologist.  We could consider changing Cardizem to verapamil if her dermatologist feels this would improve her psoriasis.  If she has more frequent episodes in the future we will consider antiarrhythmic therapy. CHADSvasc 1 for female sex.  Continue aspirin 81 mg daily. 2. Palpitations-patient has had intermittent palpitations.  We discussed continuing alivecor to assess.  She feels these are not related to atrial fibrillation. 3. Mitral valve prolapse-mild mitral regurgitation on most recent echocardiogram.  She will need follow-up studies in the future. 4. Hyperlipidemia-continue diet. 5. Hypothyroidism-managed by primary care.  COVID-19 Education: The importance of social distancing was discussed today.  Time:   Today, I have spent 25 minutes with the patient with telehealth technology discussing the above problems.     Medication Adjustments/Labs and Tests Ordered: Current medicines are reviewed at length with the patient  today.  Concerns regarding medicines are outlined above.   Tests Ordered: No orders of the defined types were placed in this encounter.   Medication Changes: No orders of the defined types were placed in this encounter.   Disposition:  Follow up in 6 month(s)  Signed, Olga Millers, MD  09/22/2018 7:59 AM    [facility]

## 2018-09-22 ENCOUNTER — Other Ambulatory Visit: Payer: Self-pay | Admitting: *Deleted

## 2018-09-22 ENCOUNTER — Encounter: Payer: Self-pay | Admitting: Cardiology

## 2018-09-22 ENCOUNTER — Telehealth: Payer: Self-pay

## 2018-09-22 ENCOUNTER — Other Ambulatory Visit: Payer: Self-pay

## 2018-09-22 ENCOUNTER — Telehealth (INDEPENDENT_AMBULATORY_CARE_PROVIDER_SITE_OTHER): Payer: BLUE CROSS/BLUE SHIELD | Admitting: Cardiology

## 2018-09-22 VITALS — Ht 64.0 in | Wt 138.0 lb

## 2018-09-22 DIAGNOSIS — I341 Nonrheumatic mitral (valve) prolapse: Secondary | ICD-10-CM

## 2018-09-22 DIAGNOSIS — I480 Paroxysmal atrial fibrillation: Secondary | ICD-10-CM

## 2018-09-22 DIAGNOSIS — R002 Palpitations: Secondary | ICD-10-CM

## 2018-09-22 MED ORDER — DILTIAZEM HCL ER COATED BEADS 120 MG PO CP24: 120.0000 mg | ORAL_CAPSULE | Freq: Every day | ORAL | 3 refills | Status: AC

## 2018-09-22 NOTE — Telephone Encounter (Signed)
Virtual Visit Pre-Appointment Phone Call  "(Name), I am calling you today to discuss your upcoming appointment. We are currently trying to limit exposure to the virus that causes COVID-19 by seeing patients at home rather than in the office."  1. "What is the BEST phone number to call the day of the visit?" - include this in appointment notes  2. "Do you have or have access to (through a family member/friend) a smartphone with video capability that we can use for your visit?" a. If yes - list this number in appt notes as "cell" (if different from BEST phone #) and list the appointment type as a VIDEO visit in appointment notes b. If no - list the appointment type as a PHONE visit in appointment notes  3. Confirm consent - "In the setting of the current Covid19 crisis, you are scheduled for a (phone or video) visit with your provider on (date) at (time).  Just as we do with many in-office visits, in order for you to participate in this visit, we must obtain consent.  If you'd like, I can send this to your mychart (if signed up) or email for you to review.  Otherwise, I can obtain your verbal consent now.  All virtual visits are billed to your insurance company just like a normal visit would be.  By agreeing to a virtual visit, we'd like you to understand that the technology does not allow for your provider to perform an examination, and thus may limit your provider's ability to fully assess your condition. If your provider identifies any concerns that need to be evaluated in person, we will make arrangements to do so.  Finally, though the technology is pretty good, we cannot assure that it will always work on either your or our end, and in the setting of a video visit, we may have to convert it to a phone-only visit.  In either situation, we cannot ensure that we have a secure connection.  Are you willing to proceed?" STAFF: Did the patient verbally acknowledge consent to telehealth visit? Document  YES/NO here: YES  4. Advise patient to be prepared - "Two hours prior to your appointment, go ahead and check your blood pressure, pulse, oxygen saturation, and your weight (if you have the equipment to check those) and write them all down. When your visit starts, your provider will ask you for this information. If you have an Apple Watch or Kardia device, please plan to have heart rate information ready on the day of your appointment. Please have a pen and paper handy nearby the day of the visit as well."  5. Give patient instructions for MyChart download to smartphone OR Doximity/Doxy.me as below if video visit (depending on what platform provider is using)  6. Inform patient they will receive a phone call 15 minutes prior to their appointment time (may be from unknown caller ID) so they should be prepared to answer    TELEPHONE CALL NOTE  Christine Hendrix has been deemed a candidate for a follow-up tele-health visit to limit community exposure during the Covid-19 pandemic. I spoke with the patient via phone to ensure availability of phone/video source, confirm preferred email & phone number, and discuss instructions and expectations.  I reminded Christine Hendrix to be prepared with any vital sign and/or heart rhythm information that could potentially be obtained via home monitoring, at the time of her visit. I reminded Christine Hendrix to expect a phone call prior to her visit.  [name], CMA 09/22/2018 7:48 AM   INSTRUCTIONS FOR DOWNLOADING THE MYCHART APP TO SMARTPHONE  - The patient must first make sure to have activated MyChart and know their login information - If Apple, go to [employer] and type in MyChart in the search bar and download the app. If Android, ask patient to go to [employer] and type in [place] in the search bar and download the app. The app is free but as with any other app downloads, their phone may require them to verify saved payment information or Apple/Android password.   - The patient will need to then log into the app with their MyChart username and password, and select [facility] as their healthcare provider to link the account. When it is time for your visit, go to the MyChart app, find appointments, and click Begin Video Visit. Be sure to Select Allow for your device to access the Microphone and Camera for your visit. You will then be connected, and your provider will be with you shortly.  **If they have any issues connecting, or need assistance please contact MyChart service desk [phone])**  **If using a computer, in order to ensure the best quality for their visit they will need to use either of the following Internet Browsers: [employer], or Google Chrome**  IF USING DOXIMITY or DOXY.ME - The patient will receive a link just prior to their visit by text.     FULL LENGTH CONSENT FOR TELE-HEALTH VISIT   I hereby voluntarily request, consent and authorize [facility] and its employed or contracted physicians, physician assistants, nurse practitioners or other licensed health care professionals (the Practitioner), to provide me with telemedicine health care services (the "Services") as deemed necessary by the treating Practitioner. I acknowledge and consent to receive the Services by the Practitioner via telemedicine. I understand that the telemedicine visit will involve communicating with the Practitioner through live audiovisual communication technology and the disclosure of certain medical information by electronic transmission. I acknowledge that I have been given the opportunity to request an in-person assessment or other available alternative prior to the telemedicine visit and am voluntarily participating in the telemedicine visit.  I understand that I have the right to withhold or withdraw my consent to the use of telemedicine in the course of my care at any time, without affecting my right to future care or treatment, and that  the Practitioner or I may terminate the telemedicine visit at any time. I understand that I have the right to inspect all information obtained and/or recorded in the course of the telemedicine visit and may receive copies of available information for a reasonable fee.  I understand that some of the potential risks of receiving the Services via telemedicine include:  Marland Kitchen Delay or interruption in medical evaluation due to technological equipment failure or disruption; . Information transmitted may not be sufficient (e.g. poor resolution of images) to allow for appropriate medical decision making by the Practitioner; and/or  . In rare instances, security protocols could fail, causing a breach of personal health information.  Furthermore, I acknowledge that it is my responsibility to provide information about my medical history, conditions and care that is complete and accurate to the best of my ability. I acknowledge that Practitioner's advice, recommendations, and/or decision may be based on factors not within their control, such as incomplete or inaccurate data provided by me or distortions of diagnostic images or specimens that may result from electronic transmissions. I understand that the  practice of medicine is not an Chief Strategy Officer and that Practitioner makes no warranties or guarantees regarding treatment outcomes. I acknowledge that I will receive a copy of this consent concurrently upon execution via email to the email address I last provided but may also request a printed copy by calling the office of [facility].    I understand that my insurance will be billed for this visit.   I have read or had this consent read to me. . I understand the contents of this consent, which adequately explains the benefits and risks of the Services being provided via telemedicine.  . I have been provided ample opportunity to ask questions regarding this consent and the Services and have had my questions answered to  my satisfaction. . I give my informed consent for the services to be provided through the use of telemedicine in my medical care  By participating in this telemedicine visit I agree to the above.

## 2018-09-22 NOTE — Patient Instructions (Signed)

## 2018-10-19 DIAGNOSIS — R21 Rash and other nonspecific skin eruption: Secondary | ICD-10-CM | POA: Diagnosis not present

## 2018-10-29 DIAGNOSIS — G43709 Chronic migraine without aura, not intractable, without status migrainosus: Secondary | ICD-10-CM | POA: Diagnosis not present

## 2018-11-10 DIAGNOSIS — L300 Nummular dermatitis: Secondary | ICD-10-CM | POA: Diagnosis not present

## 2018-11-10 DIAGNOSIS — R21 Rash and other nonspecific skin eruption: Secondary | ICD-10-CM | POA: Diagnosis not present

## 2018-11-11 DIAGNOSIS — R040 Epistaxis: Secondary | ICD-10-CM | POA: Diagnosis not present

## 2018-12-14 DIAGNOSIS — J301 Allergic rhinitis due to pollen: Secondary | ICD-10-CM | POA: Diagnosis not present

## 2018-12-29 DIAGNOSIS — J301 Allergic rhinitis due to pollen: Secondary | ICD-10-CM | POA: Diagnosis not present

## 2019-01-13 DIAGNOSIS — K5904 Chronic idiopathic constipation: Secondary | ICD-10-CM | POA: Diagnosis not present

## 2019-01-13 DIAGNOSIS — Z800 Family history of malignant neoplasm of digestive organs: Secondary | ICD-10-CM | POA: Diagnosis not present

## 2019-01-13 DIAGNOSIS — R1011 Right upper quadrant pain: Secondary | ICD-10-CM | POA: Diagnosis not present

## 2019-01-13 DIAGNOSIS — K219 Gastro-esophageal reflux disease without esophagitis: Secondary | ICD-10-CM | POA: Diagnosis not present

## 2019-01-14 ENCOUNTER — Telehealth: Payer: Self-pay | Admitting: Family Medicine

## 2019-01-14 DIAGNOSIS — Z90710 Acquired absence of both cervix and uterus: Secondary | ICD-10-CM | POA: Diagnosis not present

## 2019-01-14 DIAGNOSIS — N952 Postmenopausal atrophic vaginitis: Secondary | ICD-10-CM | POA: Diagnosis not present

## 2019-01-14 DIAGNOSIS — Z1151 Encounter for screening for human papillomavirus (HPV): Secondary | ICD-10-CM | POA: Diagnosis not present

## 2019-01-14 DIAGNOSIS — Z124 Encounter for screening for malignant neoplasm of cervix: Secondary | ICD-10-CM | POA: Diagnosis not present

## 2019-01-14 DIAGNOSIS — Z8742 Personal history of other diseases of the female genital tract: Secondary | ICD-10-CM | POA: Insufficient documentation

## 2019-01-14 DIAGNOSIS — B373 Candidiasis of vulva and vagina: Secondary | ICD-10-CM | POA: Diagnosis not present

## 2019-01-14 DIAGNOSIS — Z779 Other contact with and (suspected) exposures hazardous to health: Secondary | ICD-10-CM | POA: Diagnosis not present

## 2019-01-14 DIAGNOSIS — Z79890 Hormone replacement therapy (postmenopausal): Secondary | ICD-10-CM | POA: Diagnosis not present

## 2019-01-14 DIAGNOSIS — Z01419 Encounter for gynecological examination (general) (routine) without abnormal findings: Secondary | ICD-10-CM | POA: Diagnosis not present

## 2019-01-14 NOTE — Telephone Encounter (Signed)
Patient is calling to schedule appt per her gyn because there is an increase of protein urine.  Please advise CB- [phone]

## 2019-01-17 NOTE — Telephone Encounter (Signed)
I called and spoke to patient, appointment scheduled for 02/09/2019 per pt.

## 2019-01-17 NOTE — Telephone Encounter (Signed)
Okay to schedule appt with pcp.

## 2019-01-20 DIAGNOSIS — M5416 Radiculopathy, lumbar region: Secondary | ICD-10-CM | POA: Diagnosis not present

## 2019-01-20 DIAGNOSIS — G894 Chronic pain syndrome: Secondary | ICD-10-CM | POA: Diagnosis not present

## 2019-01-20 DIAGNOSIS — M545 Low back pain: Secondary | ICD-10-CM | POA: Diagnosis not present

## 2019-01-21 ENCOUNTER — Telehealth: Payer: Self-pay | Admitting: Cardiology

## 2019-01-21 NOTE — Telephone Encounter (Signed)
Patient takes Cardizem at night and forgot to take it last night, can she take it this morning and again tonight? Please advise

## 2019-01-24 NOTE — Telephone Encounter (Signed)
Left message for patient, sorry for delay I was off Friday. I ask the patient to call with any questions.

## 2019-02-04 ENCOUNTER — Other Ambulatory Visit: Payer: Self-pay | Admitting: Chiropractic Medicine

## 2019-02-04 DIAGNOSIS — M545 Low back pain, unspecified: Secondary | ICD-10-CM

## 2019-02-07 ENCOUNTER — Telehealth: Payer: Self-pay

## 2019-02-07 NOTE — Telephone Encounter (Signed)
Dr. Zigmund Daniel please advise  Copied from [place] [id number]. Topic: General - Other >> Feb 07, 2019  2:03 PM Leward Quan A wrote: Reason for CRM: Patient called to have her appointment cancelled for Wed 02/09/2019 because she is currently on antibiotics. She feel that the antibiotics would interfere with the urine recheck that she was coming for. [place] for a call back to say how long she need to wait after coming off the med's she will be done on Friday 02/11/2019. Please call Ph#  [phone]

## 2019-02-07 NOTE — Telephone Encounter (Signed)
Pt aware. Rescheduled for 9/21

## 2019-02-07 NOTE — Telephone Encounter (Signed)
Over the weekend would be a sufficient time period before returning to have re-test.

## 2019-02-09 ENCOUNTER — Ambulatory Visit: Payer: BLUE CROSS/BLUE SHIELD | Admitting: Family Medicine

## 2019-02-14 ENCOUNTER — Ambulatory Visit: Payer: BC Managed Care – PPO | Admitting: Family Medicine

## 2019-02-18 ENCOUNTER — Ambulatory Visit: Payer: BC Managed Care – PPO | Admitting: Family Medicine

## 2019-02-21 ENCOUNTER — Encounter: Payer: Self-pay | Admitting: Family Medicine

## 2019-02-21 ENCOUNTER — Ambulatory Visit (INDEPENDENT_AMBULATORY_CARE_PROVIDER_SITE_OTHER): Payer: BC Managed Care – PPO | Admitting: Family Medicine

## 2019-02-21 ENCOUNTER — Other Ambulatory Visit: Payer: Self-pay

## 2019-02-21 VITALS — BP 118/74 | HR 72 | Temp 97.9°F | Ht 64.0 in | Wt 145.6 lb

## 2019-02-21 DIAGNOSIS — E039 Hypothyroidism, unspecified: Secondary | ICD-10-CM

## 2019-02-21 DIAGNOSIS — Z23 Encounter for immunization: Secondary | ICD-10-CM | POA: Diagnosis not present

## 2019-02-21 DIAGNOSIS — R809 Proteinuria, unspecified: Secondary | ICD-10-CM

## 2019-02-21 LAB — POCT URINALYSIS DIPSTICK
Bilirubin, UA: NEGATIVE
Glucose, UA: NEGATIVE
Ketones, UA: NEGATIVE
Leukocytes, UA: NEGATIVE
Nitrite, UA: NEGATIVE
Protein, UA: POSITIVE — AB
Spec Grav, UA: 1.015 (ref 1.010–1.025)
Urobilinogen, UA: NEGATIVE E.U./dL — AB
pH, UA: 6.5 (ref 5.0–8.0)

## 2019-02-21 NOTE — Assessment & Plan Note (Addendum)
Possibly related to recent infection vs history of renal stones.  UA, protein creatinine ratio and bmp ordered today.

## 2019-02-21 NOTE — Assessment & Plan Note (Signed)
-  Updated TSH and Ft4 today

## 2019-02-21 NOTE — Progress Notes (Signed)
Christine Hendrix - 53 y.o. female MRN [id number]  Date of birth: 10-07-1965  Subjective Chief Complaint  Patient presents with  . Proteinuria    Urine was done at OBGYN,     HPI Christine Hendrix is a 53 y.o. female with history of hypothyroidism, PAF, allergies and migraines here today for f/u of proteinuria.  She reports that she had evaluation at her ObGYN last month and was found to have proteinuria.  She was treated for a yeast infection at that time and instructed to f/u with PCP.  She denies any urinary symptoms, flank pain, or hematuria. Her BP is well controlled.    She is also due for f/u of hypothyroidism.  She has also had some increased issues with eczema.  Her dermatologist is considering starting dupixent for this.  Her energy levels have been normal.   ROS:  A comprehensive ROS was completed and negative except as noted per HPI    Allergies  Allergen Reactions  . Amoxicillin-Pot Clavulanate Nausea Only and Other (See Comments)  . Ciprofloxacin Nausea Only  . Clindamycin Nausea Only and Other (See Comments)  . Codeine Nausea And Vomiting, Other (See Comments) and Rash    Vomiting and dizziness  . Doxycycline Nausea Only and Other (See Comments)  . Quinidine Nausea Only and Other (See Comments)  . Quinine Other (See Comments)  . Quinolones Nausea And Vomiting and Other (See Comments)    Nausea, Dizziness Nausea, Dizziness Nausea, Dizziness   . Solifenacin Other (See Comments)    BLURRED VISION Other reaction(s): Hallucinations Visual disturbances    Past Medical History:  Diagnosis Date  . Arthritis    neck  . Colitis    chronic   . Dental crowns present    also dental implant lower right  . Hyperlipidemia   . Hypothyroidism   . Migraines   . MVP (mitral valve prolapse)   . Nasal turbinate hypertrophy 04/2016  . Nephrolithiasis   . PONV (postoperative nausea and vomiting)   . Psoriasis   . TMJ syndrome    limited jaw opening    Past Surgical History:   Procedure Laterality Date  . ABDOMINAL HYSTERECTOMY     complete  . BUNIONECTOMY WITH HAMMERTOE RECONSTRUCTION Left 03/04/2016   4th/5th toes  . CHOLECYSTECTOMY    . TMJ ARTHROPLASTY    . TRIGEMINAL NERVE DECOMPRESSION    . TURBINATE REDUCTION Bilateral 05/23/2016   Procedure: BILATERAL TURBINATE REDUCTION;  Surgeon: Leta Baptist, MD;  Location: [facility];  Service: ENT;  Laterality: Bilateral;    Social History   Socioeconomic History  . Marital status: Married    Spouse name: Not on file  . Number of children: Not on file  . Years of education: Not on file  . Highest education level: Not on file  Occupational History    Comment: Education officer, museum  Social Needs  . Financial resource strain: Not on file  . Food insecurity    Worry: Not on file    Inability: Not on file  . Transportation needs    Medical: Not on file    Non-medical: Not on file  Tobacco Use  . Smoking status: Never Smoker  . Smokeless tobacco: Never Used  Substance and Sexual Activity  . Alcohol use: No    Alcohol/week: 0.0 standard drinks  . Drug use: No  . Sexual activity: Not on file  Lifestyle  . Physical activity    Days per week: Not on file  Minutes per session: Not on file  . Stress: Not on file  Relationships  . Social Musician on phone: Not on file    Gets together: Not on file    Attends religious service: Not on file    Active member of club or organization: Not on file    Attends meetings of clubs or organizations: Not on file    Relationship status: Not on file  Other Topics Concern  . Not on file  Social History Narrative  . Not on file    Family History  Problem Relation Age of Onset  . Heart disease Father   . Heart failure Father   . Heart disease Mother   . CAD Mother     Health Maintenance  Topic Date Due  . PAP SMEAR-Modifier  03/27/1986  . COLONOSCOPY  03/28/2015  . INFLUENZA VACCINE  12/25/2018  . HIV Screening  02/27/2019 (Originally  03/27/1980)  . TETANUS/TDAP  05/26/2020  . MAMMOGRAM  07/02/2020    ----------------------------------------------------------------------------------------------------------------------------------------------------------------------------------------------------------------- Physical Exam BP 118/74   Pulse 72   Temp 97.9 F (36.6 C)   Ht 5\' 4"  (1.626 m)   Wt 145 lb 9.6 oz (66 kg)   SpO2 99%   BMI 24.99 kg/m   Physical Exam Constitutional:      Appearance: Normal appearance.  HENT:     Head: Normocephalic and atraumatic.  Eyes:     General: No scleral icterus. Neck:     Musculoskeletal: Neck supple.  Cardiovascular:     Rate and Rhythm: Normal rate and regular rhythm.  Pulmonary:     Effort: Pulmonary effort is normal.     Breath sounds: Normal breath sounds.  Skin:    General: Skin is warm and dry.  Neurological:     General: No focal deficit present.     Mental Status: She is alert.  Psychiatric:        Mood and Affect: Mood normal.        Behavior: Behavior normal.     ------------------------------------------------------------------------------------------------------------------------------------------------------------------------------------------------------------------- Assessment and Plan  Hypothyroidism -Updated TSH and Ft4 today  Proteinuria Possibly related to recent infection vs history of renal stones.  UA, protein creatinine ratio and bmp ordered today.

## 2019-02-21 NOTE — Patient Instructions (Signed)
Great to see you today! We'll be in touch with lab results and any additional recommendations.

## 2019-02-22 LAB — COMPREHENSIVE METABOLIC PANEL
ALT: 11 U/L (ref 0–35)
AST: 12 U/L (ref 0–37)
Albumin: 4 g/dL (ref 3.5–5.2)
Alkaline Phosphatase: 61 U/L (ref 39–117)
BUN: 14 mg/dL (ref 6–23)
CO2: 26 mEq/L (ref 19–32)
Calcium: 8.8 mg/dL (ref 8.4–10.5)
Chloride: 101 mEq/L (ref 96–112)
Creatinine, Ser: 0.74 mg/dL (ref 0.40–1.20)
GFR: 81.81 mL/min (ref >60.00)
Glucose, Bld: 99 mg/dL (ref 70–99)
Potassium: 3.9 mEq/L (ref 3.5–5.1)
Sodium: 137 mEq/L (ref 135–145)
Total Bilirubin: 0.3 mg/dL (ref 0.2–1.2)
Total Protein: 6.9 g/dL (ref 6.0–8.3)

## 2019-02-22 LAB — PROTEIN / CREATININE RATIO, URINE
Creatinine, Urine: 157 mg/dL (ref 20–275)
Protein/Creat Ratio: 64 mg/g creat (ref 21–161)
Protein/Creatinine Ratio: 0.064 mg/mg creat (ref 0.021–0.16)
Total Protein, Urine: 10 mg/dL (ref 5–24)

## 2019-02-22 LAB — TSH: TSH: 1.76 u[IU]/mL (ref 0.35–4.50)

## 2019-02-22 LAB — T4, FREE: Free T4: 0.93 ng/dL (ref 0.60–1.60)

## 2019-02-25 ENCOUNTER — Ambulatory Visit
Admission: RE | Admit: 2019-02-25 | Discharge: 2019-02-25 | Disposition: A | Discharge status: Home / Self Care | Payer: BC Managed Care – PPO | Source: Ambulatory Visit | Attending: Chiropractic Medicine | Admitting: Chiropractic Medicine

## 2019-02-25 ENCOUNTER — Other Ambulatory Visit: Payer: Self-pay

## 2019-02-25 DIAGNOSIS — M545 Low back pain, unspecified: Secondary | ICD-10-CM

## 2019-02-25 DIAGNOSIS — D1809 Hemangioma of other sites: Secondary | ICD-10-CM | POA: Diagnosis not present

## 2019-02-28 NOTE — Progress Notes (Deleted)
HPI: FUatrial fibrillation. H/O MVP. Seen in ER 12/05/17; with CP, palpitations and dyspnea; in atrial fibrillation; chest xray neg; TSH 2.458, Cr .72, Hgb 14.3. Converted to sinus with cardizem. Echocardiogram August 2019 showed normal LV function, mild prolapse of the posterior mitral valve leaflet and mild mitral regurgitation. Since last seen   Current Outpatient Medications  Medication Sig Dispense Refill  . aspirin EC 81 MG tablet Take 1 tablet (81 mg total) by mouth daily. 90 tablet 3  . diltiazem (CARDIZEM CD) 120 MG 24 hr capsule Take 1 capsule (120 mg total) by mouth daily. 90 capsule 3  . Erenumab-aooe (AIMOVIG) 140 MG/ML SOAJ Inject into the skin.    Marland Kitchen estrogens, conjugated, (PREMARIN) 1.25 MG tablet Take 1.25 mg by mouth daily.    Marland Kitchen ibuprofen (ADVIL) 800 MG tablet Take 800 mg by mouth every 8 (eight) hours as needed for headache.    . levothyroxine (SYNTHROID, LEVOTHROID) 50 MCG tablet TAKE 1 TABLET DAILY 90 tablet 3   No current facility-administered medications for this visit.      Past Medical History:  Diagnosis Date  . Arthritis    neck  . Colitis    chronic   . Dental crowns present    also dental implant lower right  . Hyperlipidemia   . Hypothyroidism   . Kidney stone on left side 03/27/2017  . Migraines   . MVP (mitral valve prolapse)   . Nasal turbinate hypertrophy 04/2016  . Nephrolithiasis   . PONV (postoperative nausea and vomiting)   . Psoriasis   . TMJ syndrome    limited jaw opening    Past Surgical History:  Procedure Laterality Date  . ABDOMINAL HYSTERECTOMY     complete  . BUNIONECTOMY WITH HAMMERTOE RECONSTRUCTION Left 03/04/2016   4th/5th toes  . CHOLECYSTECTOMY    . TMJ ARTHROPLASTY    . TRIGEMINAL NERVE DECOMPRESSION    . TURBINATE REDUCTION Bilateral 05/23/2016   Procedure: BILATERAL TURBINATE REDUCTION;  Surgeon: Leta Baptist, MD;  Location: [facility];  Service: ENT;  Laterality: Bilateral;    Social History    Socioeconomic History  . Marital status: Married    Spouse name: Not on file  . Number of children: Not on file  . Years of education: Not on file  . Highest education level: Not on file  Occupational History    Comment: Education officer, museum  Social Needs  . Financial resource strain: Not on file  . Food insecurity    Worry: Not on file    Inability: Not on file  . Transportation needs    Medical: Not on file    Non-medical: Not on file  Tobacco Use  . Smoking status: Never Smoker  . Smokeless tobacco: Never Used  Substance and Sexual Activity  . Alcohol use: No    Alcohol/week: 0.0 standard drinks  . Drug use: No  . Sexual activity: Not on file  Lifestyle  . Physical activity    Days per week: Not on file    Minutes per session: Not on file  . Stress: Not on file  Relationships  . Social Herbalist on phone: Not on file    Gets together: Not on file    Attends religious service: Not on file    Active member of club or organization: Not on file    Attends meetings of clubs or organizations: Not on file    Relationship status: Not on file  .  Intimate partner violence    Fear of current or ex partner: Not on file    Emotionally abused: Not on file    Physically abused: Not on file    Forced sexual activity: Not on file  Other Topics Concern  . Not on file  Social History Narrative  . Not on file    Family History  Problem Relation Age of Onset  . Heart disease Father   . Heart failure Father   . Heart disease Mother   . CAD Mother     ROS: no fevers or chills, productive cough, hemoptysis, dysphasia, odynophagia, melena, hematochezia, dysuria, hematuria, rash, seizure activity, orthopnea, PND, pedal edema, claudication. Remaining systems are negative.  Physical Exam: Well-developed well-nourished in no acute distress.  Skin is warm and dry.  HEENT is normal.  Neck is supple.  Chest is clear to auscultation with normal expansion.  Cardiovascular exam  is regular rate and rhythm.  Abdominal exam nontender or distended. No masses palpated. Extremities show no edema. neuro grossly intact  ECG- personally reviewed  A/P  1 paroxysmal atrial fibrillation-she has had no recurrences.  Continue Toprol and Cardizem.  If she has more frequent episodes in the future we will consider an antiarrhythmic. CHADSvasc 1 for female sex.  Continue aspirin 81 mg daily.  2 palpitations-symptoms are reasonably well controlled.  We will continue with present dose of medications.  3 mitral valve prolapse-mitral regurgitation is mild on most recent echo.  4 hyperlipidemia-continue diet.  Olga Millers, MD

## 2019-03-09 ENCOUNTER — Ambulatory Visit: Payer: BC Managed Care – PPO | Admitting: Cardiology

## 2019-03-09 ENCOUNTER — Telehealth: Payer: Self-pay | Admitting: Cardiology

## 2019-03-09 NOTE — Telephone Encounter (Signed)
Please call back for more questions

## 2019-03-09 NOTE — Telephone Encounter (Signed)
Okay to take dupixent. No contraindications noted and no drug drug interactions expected with your current medication therapy.

## 2019-03-09 NOTE — Telephone Encounter (Signed)
Left detailed message for pt as notated below. CB if any further questions

## 2019-03-09 NOTE — Telephone Encounter (Signed)
Starting to take Dupixant, will there be an interaction?

## 2019-03-14 NOTE — Telephone Encounter (Signed)
NOT NEEDED

## 2019-03-17 DIAGNOSIS — M25512 Pain in left shoulder: Secondary | ICD-10-CM | POA: Diagnosis not present

## 2019-03-17 DIAGNOSIS — M542 Cervicalgia: Secondary | ICD-10-CM | POA: Diagnosis not present

## 2019-04-12 ENCOUNTER — Ambulatory Visit: Payer: Self-pay

## 2019-04-12 NOTE — Telephone Encounter (Signed)
Incoming call from Pt.  With complaint of abdominal pain on the right.   Does not radiate.   onset Sunday night.  Was doubled over in pain for 30 minutes.   States pain comes and goes. Reports discomfort now.   Request an appointment.  Today or tomorrow.  Attempted to call office. Patient awaits call from office  for appointment .           Answer Assessment - Initial Assessment Questions 1. LOCATION: "Where does it hurt?"      Right side pain 2. RADIATION: "Does the pain shoot anywhere else?" (e.g., chest, back)    denies 3. ONSET: "When did the pain begin?" (e.g., minutes, hours or days ago)    Sunday night 4. SUDDEN: "Gradual or sudden onset?"     suddeden 5. PATTERN "Does the pain come and go, or is it constant?"    - If constant: "Is it getting better, staying the same, or worsening?"      (Note: Constant means the pain never goes away completely; most serious pain is constant and it progresses)     - If intermittent: "How long does it last?" "Do you have pain now?"     (Note: Intermittent means the pain goes away completely between bouts)    Comes and goes 6. SEVERITY: "How bad is the pain?"  (e.g., Scale 1-10; mild, moderate, or severe)   - MILD (1-3): doesn\'t interfere with normal activities, abdomen soft and not tender to touch    - MODERATE (4-7): interferes with normal activities or awakens from sleep, tender to touch    - SEVERE (8-10): excruciating pain, doubled over, unable to do any normal activities      Severe on Sunday   Now slight discomfort 7. RECURRENT SYMPTOM: "Have you ever had this type of abdominal pain before?" If so, ask: "When was the last time?" and "What happened that time?"     yues 8. CAUSE: "What do you think is causing the abdominal pain?"     *No Answer* 9. RELIEVING/AGGRAVATING FACTORS: "What makes it better or worse?" (e.g., movement, antacids, bowel movement)     denies 10. OTHER SYMPTOMS: "Has there been any vomiting, diarrhea, constipation, or  urine problems?"       denies 11. PREGNANCY: "Is there any chance you are pregnant?" "When was your last menstrual period?"       19 95  Protocols used: ABDOMINAL PAIN - [facility]

## 2019-04-17 ENCOUNTER — Encounter: Payer: Self-pay | Admitting: Family Medicine

## 2019-04-18 ENCOUNTER — Encounter: Payer: Self-pay | Admitting: Family Medicine

## 2019-04-18 ENCOUNTER — Ambulatory Visit (INDEPENDENT_AMBULATORY_CARE_PROVIDER_SITE_OTHER): Payer: BC Managed Care – PPO | Admitting: Family Medicine

## 2019-04-18 ENCOUNTER — Ambulatory Visit (INDEPENDENT_AMBULATORY_CARE_PROVIDER_SITE_OTHER): Payer: BC Managed Care – PPO

## 2019-04-18 ENCOUNTER — Other Ambulatory Visit: Payer: Self-pay

## 2019-04-18 ENCOUNTER — Other Ambulatory Visit: Payer: BC Managed Care – PPO

## 2019-04-18 VITALS — Ht 64.0 in

## 2019-04-18 DIAGNOSIS — R1031 Right lower quadrant pain: Secondary | ICD-10-CM | POA: Insufficient documentation

## 2019-04-18 DIAGNOSIS — R109 Unspecified abdominal pain: Secondary | ICD-10-CM | POA: Diagnosis not present

## 2019-04-18 LAB — POCT URINALYSIS DIPSTICK
Glucose, UA: NEGATIVE
Ketones, UA: NEGATIVE
Leukocytes, UA: NEGATIVE
Nitrite, UA: NEGATIVE
Protein, UA: POSITIVE — AB
Spec Grav, UA: >=1.03 — AB (ref 1.010–1.025)
Urobilinogen, UA: 0.2 E.U./dL
pH, UA: 6 (ref 5.0–8.0)

## 2019-04-18 NOTE — Assessment & Plan Note (Signed)
She will come in this afternoon for UA and KUB, if these are normal we discussed proceeding with abdominal US. Discussed this may be related to adhesions, however she denies s/s of constipation or obstruction.  I think appendicitis is unlikely given intermittent nature.

## 2019-04-18 NOTE — Telephone Encounter (Signed)
Yes, please set up a VV for a day/time at her convenience.

## 2019-04-18 NOTE — Progress Notes (Signed)
Christine Hendrix - 53 y.o. female MRN [id number]  Date of birth: [birth year]   This visit type was conducted due to national recommendations for restrictions regarding the COVID-19 Pandemic (e.g. social distancing).  This format is felt to be most appropriate for this patient at this time.  All issues noted in this document were discussed and addressed.  No physical exam was performed (except for noted visual exam findings with Video Visits).  I discussed the limitations of evaluation and management by telemedicine and the availability of in person appointments. The patient expressed understanding and agreed to proceed.  I connected with@ on 04/18/19 at 11:20 AM EST by a video enabled telemedicine application and verified that I am speaking with the correct person using two identifiers.  Present for visit:  Christine Hendrix   Patient Location: Home [place] [address]   Provider location:   [place]  Chief Complaint  Patient presents with  . Abdominal Pain    SEVERE pain in right lower abdomen that'll last for about 2mins happened twice 11/15 & 11/21.    HPI  Christine Hendrix is a 53 y.o. female who presents via audio/video conferencing for a telehealth visit today.  She has complaint of intermittent abdominal pain.  Pain is located in the RLQ.  She was having mild episodes previously however recent episodes on 11/15 and 11/21 were more severe.  She describes as a sharp, stabbing sensation.  She denies urinary symptoms, changes to bowels/constipation.  She did have some mild nausea during last episode.  She is s/p hysterectomy and oopherectomy as well as cholecystectomy.  She does report that surgeon commented on scar tissue in her abdomen when she had cholecystectomy.     ROS:  A comprehensive ROS was completed and negative except as noted per HPI  Past Medical History:  Diagnosis Date  . Arthritis    neck  . Colitis    chronic   . Dental  crowns present    also dental implant lower right  . Hyperlipidemia   . Hypothyroidism   . Kidney stone on left side 03/27/2017  . Migraines   . MVP (mitral valve prolapse)   . Nasal turbinate hypertrophy 04/2016  . Nephrolithiasis   . PONV (postoperative nausea and vomiting)   . Psoriasis   . TMJ syndrome    limited jaw opening    Past Surgical History:  Procedure Laterality Date  . ABDOMINAL HYSTERECTOMY     complete  . BUNIONECTOMY WITH HAMMERTOE RECONSTRUCTION Left 03/04/2016   4th/5th toes  . CHOLECYSTECTOMY    . TMJ ARTHROPLASTY    . TRIGEMINAL NERVE DECOMPRESSION    . TURBINATE REDUCTION Bilateral 05/23/2016   Procedure: BILATERAL TURBINATE REDUCTION;  Surgeon: Leta Baptist, MD;  Location: [facility];  Service: ENT;  Laterality: Bilateral;    Family History  Problem Relation Age of Onset  . Heart disease Father   . Heart failure Father   . Heart disease Mother   . CAD Mother     Social History   Socioeconomic History  . Marital status: Married    Spouse name: Not on file  . Number of children: Not on file  . Years of education: Not on file  . Highest education level: Not on file  Occupational History    Comment: Education officer, museum  Social Needs  . Financial resource strain: Not on file  . Food insecurity    Worry: Not on file  Inability: Not on file  . Transportation needs    Medical: Not on file    Non-medical: Not on file  Tobacco Use  . Smoking status: Never Smoker  . Smokeless tobacco: Never Used  Substance and Sexual Activity  . Alcohol use: No    Alcohol/week: 0.0 standard drinks  . Drug use: No  . Sexual activity: Not on file  Lifestyle  . Physical activity    Days per week: Not on file    Minutes per session: Not on file  . Stress: Not on file  Relationships  . Social Musician on phone: Not on file    Gets together: Not on file    Attends religious service: Not on file    Active member of club or organization:  Not on file    Attends meetings of clubs or organizations: Not on file    Relationship status: Not on file  . Intimate partner violence    Fear of current or ex partner: Not on file    Emotionally abused: Not on file    Physically abused: Not on file    Forced sexual activity: Not on file  Other Topics Concern  . Not on file  Social History Narrative  . Not on file     Current Outpatient Medications:  .  aspirin EC 81 MG tablet, Take 1 tablet (81 mg total) by mouth daily., Disp: 90 tablet, Rfl: 3 .  Erenumab-aooe (AIMOVIG) 140 MG/ML SOAJ, Inject into the skin., Disp: , Rfl:  .  estrogens, conjugated, (PREMARIN) 1.25 MG tablet, Take 1.25 mg by mouth daily., Disp: , Rfl:  .  ibuprofen (ADVIL) 800 MG tablet, Take 800 mg by mouth every 8 (eight) hours as needed for headache., Disp: , Rfl:  .  levothyroxine (SYNTHROID, LEVOTHROID) 50 MCG tablet, TAKE 1 TABLET DAILY, Disp: 90 tablet, Rfl: 3 .  diltiazem (CARDIZEM CD) 120 MG 24 hr capsule, Take 1 capsule (120 mg total) by mouth daily., Disp: 90 capsule, Rfl: 3  EXAM:  VITALS per patient if applicable: Ht 5\' 4"  (1.626 m)   BMI 24.99 kg/m   GENERAL: alert, oriented, appears well and in no acute distress  HEENT: atraumatic, conjunttiva clear, no obvious abnormalities on inspection of external nose and ears  NECK: normal movements of the head and neck  LUNGS: on inspection no signs of respiratory distress, breathing rate appears normal, no obvious gross SOB, gasping or wheezing  CV: no obvious cyanosis  MS: moves all visible extremities without noticeable abnormality  PSYCH/NEURO: pleasant and cooperative, no obvious depression or anxiety, speech and thought processing grossly intact  ASSESSMENT AND PLAN:  Discussed the following assessment and plan:  Right lower quadrant abdominal pain She will come in this afternoon for UA and KUB, if these are normal we discussed proceeding with abdominal . Discussed this may be related to  adhesions, however she denies s/s of constipation or obstruction.  I think appendicitis is unlikely given intermittent nature.        I discussed the assessment and treatment plan with the patient. The patient was provided an opportunity to ask questions and all were answered. The patient agreed with the plan and demonstrated an understanding of the instructions.   The patient was advised to call back or seek an in-person evaluation if the symptoms worsen or if the condition fails to improve as anticipated.    [place], DO

## 2019-04-18 NOTE — Addendum Note (Signed)
Addended by: Perlie Mayo on: 04/18/2019 04:48 PM   Modules accepted: Orders

## 2019-04-19 ENCOUNTER — Other Ambulatory Visit: Payer: Self-pay | Admitting: Family Medicine

## 2019-04-19 ENCOUNTER — Encounter: Payer: Self-pay | Admitting: Family Medicine

## 2019-04-19 DIAGNOSIS — R1031 Right lower quadrant pain: Secondary | ICD-10-CM

## 2019-04-19 DIAGNOSIS — M25512 Pain in left shoulder: Secondary | ICD-10-CM | POA: Diagnosis not present

## 2019-04-19 DIAGNOSIS — R3129 Other microscopic hematuria: Secondary | ICD-10-CM

## 2019-04-19 DIAGNOSIS — M67812 Other specified disorders of synovium, left shoulder: Secondary | ICD-10-CM | POA: Diagnosis not present

## 2019-04-19 NOTE — Progress Notes (Signed)
HPI: FUatrial fibrillation. H/O MVP. Seen in ER 12/05/17; with CP, palpitations and dyspnea; in atrial fibrillation. Converted to sinus with cardizem.Echocardiogram August 2019 showed normal LV function, mild prolapse of the posterior mitral valve leaflet and mild mitral regurgitation. Since last seen  patient has occasional spells of dyspnea and chest heaviness like "someone standing on my chest".  It occurs randomly and not clearly related to exertion.  No radiation or associated symptoms.  There is no exertional chest pain and no syncope.  She occasionally has palpitations but no sustained symptoms similar to those when she had atrial fibrillation.  Current Outpatient Medications  Medication Sig Dispense Refill  . aspirin EC 81 MG tablet Take 1 tablet (81 mg total) by mouth daily. 90 tablet 3  . diltiazem (CARDIZEM CD) 120 MG 24 hr capsule Take 1 capsule (120 mg total) by mouth daily. 90 capsule 3  . Erenumab-aooe (AIMOVIG) 140 MG/ML SOAJ Inject into the skin.    Marland Kitchen estrogens, conjugated, (PREMARIN) 1.25 MG tablet Take 1.25 mg by mouth daily.    Marland Kitchen ibuprofen (ADVIL) 800 MG tablet Take 800 mg by mouth every 8 (eight) hours as needed for headache.    . levothyroxine (SYNTHROID, LEVOTHROID) 50 MCG tablet TAKE 1 TABLET DAILY 90 tablet 3   No current facility-administered medications for this visit.      Past Medical History:  Diagnosis Date  . Arthritis    neck  . Colitis    chronic   . Dental crowns present    also dental implant lower right  . Hyperlipidemia   . Hypothyroidism   . Kidney stone on left side 03/27/2017  . Migraines   . MVP (mitral valve prolapse)   . Nasal turbinate hypertrophy 04/2016  . Nephrolithiasis   . PONV (postoperative nausea and vomiting)   . Psoriasis   . TMJ syndrome    limited jaw opening    Past Surgical History:  Procedure Laterality Date  . ABDOMINAL HYSTERECTOMY     complete  . BUNIONECTOMY WITH HAMMERTOE RECONSTRUCTION Left 03/04/2016    4th/5th toes  . CHOLECYSTECTOMY    . TMJ ARTHROPLASTY    . TRIGEMINAL NERVE DECOMPRESSION    . TURBINATE REDUCTION Bilateral 05/23/2016   Procedure: BILATERAL TURBINATE REDUCTION;  Surgeon: Newman Pies, MD;  Location: [facility];  Service: ENT;  Laterality: Bilateral;    Social History   Socioeconomic History  . Marital status: Married    Spouse name: Not on file  . Number of children: Not on file  . Years of education: Not on file  . 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 Financial resource strain: Not on file  . Food insecurity    Worry: Not on file    Inability: Not on file  . Transportation needs    Medical: Not on file    Non-medical: Not on file  Tobacco Use  . Smoking status: Never Smoker  . Smokeless tobacco: Never Used  Substance and Sexual Activity  . Alcohol use: No    Alcohol/week: 0.0 standard drinks  . Drug use: No  . Sexual activity: Not on file  Lifestyle  . Physical activity    Days per week: Not on file    Minutes per session: Not on file  . Stress: Not on file  Relationships  . Social connections    Talks on phone: Not on file  Gets together: Not on file    Attends religious service: Not on file    Active member of club or organization: Not on file    Attends meetings of clubs or organizations: Not on file    Relationship status: Not on file  . Intimate partner violence    Fear of current or ex partner: Not on file    Emotionally abused: Not on file    Physically abused: Not on file    Forced sexual activity: Not on file  Other Topics Concern  . Not on file  Social History Narrative  . Not on file    Family History  Problem Relation Age of Onset  . Heart disease Father   . Heart failure Father   . Heart disease Mother   . CAD Mother     ROS: no fevers or chills, productive cough, hemoptysis, dysphasia, odynophagia, melena, hematochezia, dysuria,  hematuria, rash, seizure activity, orthopnea, PND, pedal edema, claudication. Remaining systems are negative.  Physical Exam: Well-developed well-nourished in no acute distress.  Skin is warm and dry.  HEENT is normal.  Neck is supple.  Chest is clear to auscultation with normal expansion.  Cardiovascular exam is regular rate and rhythm.  Abdominal exam nontender or distended. No masses palpated. Extremities show no edema. neuro grossly intact  ECG-sinus rhythm at a rate of 78, nonspecific ST changes.  Personally reviewed  A/P  1 atrial fibrillation-no recurrences based on history.  We will continue Cardizem for rate control if atrial fibrillation recurs.  Toprol was discontinued previously as she felt it was exacerbating her psoriasis.  We will consider antiarrhythmic therapy in the future if she has more frequent episodes.  CHA2DS2-VASc is 1 for female sex.  Continue aspirin 81 mg daily.  2 palpitations-symptoms have improved.  We will consider further evaluation if they worsen.  3 mitral valve prolapse-mitral regurgitation is mild on most recent echocardiogram.  She will need follow-up studies in the future.  4 hyperlipidemia-treated with diet.  5 chest heaviness-symptoms are somewhat atypical.  Electrocardiogram with nonspecific ST changes.  I will arrange a cardiac CTA to further assess.  Kirk Ruths, MD

## 2019-04-20 ENCOUNTER — Encounter: Payer: Self-pay | Admitting: Family Medicine

## 2019-04-20 LAB — URINE CULTURE
MICRO NUMBER:: 1129731
SPECIMEN QUALITY:: ADEQUATE

## 2019-04-25 ENCOUNTER — Encounter: Payer: Self-pay | Admitting: Family Medicine

## 2019-04-26 ENCOUNTER — Encounter: Payer: Self-pay | Admitting: Family Medicine

## 2019-04-26 DIAGNOSIS — R3129 Other microscopic hematuria: Secondary | ICD-10-CM | POA: Diagnosis not present

## 2019-04-26 DIAGNOSIS — R1031 Right lower quadrant pain: Secondary | ICD-10-CM | POA: Diagnosis not present

## 2019-04-26 DIAGNOSIS — N281 Cyst of kidney, acquired: Secondary | ICD-10-CM | POA: Diagnosis not present

## 2019-04-27 ENCOUNTER — Ambulatory Visit (INDEPENDENT_AMBULATORY_CARE_PROVIDER_SITE_OTHER): Payer: BC Managed Care – PPO | Admitting: Cardiology

## 2019-04-27 ENCOUNTER — Other Ambulatory Visit: Payer: Self-pay

## 2019-04-27 ENCOUNTER — Encounter: Payer: Self-pay | Admitting: Cardiology

## 2019-04-27 ENCOUNTER — Telehealth: Payer: Self-pay | Admitting: Family Medicine

## 2019-04-27 VITALS — BP 127/85 | HR 78 | Ht 64.0 in | Wt 144.1 lb

## 2019-04-27 DIAGNOSIS — R072 Precordial pain: Secondary | ICD-10-CM | POA: Diagnosis not present

## 2019-04-27 DIAGNOSIS — I480 Paroxysmal atrial fibrillation: Secondary | ICD-10-CM | POA: Diagnosis not present

## 2019-04-27 DIAGNOSIS — R319 Hematuria, unspecified: Secondary | ICD-10-CM

## 2019-04-27 DIAGNOSIS — R002 Palpitations: Secondary | ICD-10-CM

## 2019-04-27 DIAGNOSIS — I341 Nonrheumatic mitral (valve) prolapse: Secondary | ICD-10-CM

## 2019-04-27 DIAGNOSIS — R1031 Right lower quadrant pain: Secondary | ICD-10-CM

## 2019-04-27 DIAGNOSIS — R809 Proteinuria, unspecified: Secondary | ICD-10-CM

## 2019-04-27 MED ORDER — METOPROLOL TARTRATE 100 MG PO TABS: ORAL_TABLET | ORAL | 0 refills | Status: DC

## 2019-04-27 NOTE — Telephone Encounter (Signed)
Please let patient know that CT did not show kidney stone or reason for her abdominal pain or blood in her urine.  The appendix is normal. There are couple of small kidney cysts but these shouldn't cause any issues.  I would recommend having her see urology with her recent pain episodes and blood in her urine on the urinalysis.

## 2019-04-27 NOTE — Telephone Encounter (Signed)
Patient is aware of results and verbalized understanding. Referral placed for urology.

## 2019-04-27 NOTE — Patient Instructions (Addendum)
Your cardiac CT will be scheduled at one of the below locations:   [facility] [address] [place], [place] [address] [phone]  If scheduled at [facility], please arrive at the [facility] main entrance of [facility] 30-45 minutes prior to test start time. Proceed to the [facility] Radiology Department (first floor) to check-in and test prep.  Please follow these instructions carefully (unless otherwise directed):  Hold all erectile dysfunction medications at least 3 days (72 hrs) prior to test.  On the Night Before the Test: . Be sure to Drink plenty of water. . Do not consume any caffeinated/decaffeinated beverages or chocolate 12 hours prior to your test. . Do not take any antihistamines 12 hours prior to your test..  On the Day of the Test: . Drink plenty of water. Do not drink any water within one hour of the test. . Do not eat any food 4 hours prior to the test. . You may take your regular medications prior to the test.  . Take metoprolol (Lopressor) 100 MG two hours prior to test. . HOLD Furosemide/Hydrochlorothiazide morning of the test. . FEMALES- please wear underwire-free bra if available      After the Test: . Drink plenty of water. . After receiving IV contrast, you may experience a mild flushed feeling. This is normal. . On occasion, you may experience a mild rash up to 24 hours after the test. This is not dangerous. If this occurs, you can take Benadryl 25 mg and increase your fluid intake. . If you experience trouble breathing, this can be serious. If it is severe call 911 IMMEDIATELY. If it is mild, please call our office. . If you take any of these medications: Glipizide/Metformin, Avandament, Glucavance, please do not take 48 hours after completing test unless otherwise instructed.   Once we have confirmed authorization from your insurance company, we will call you to set up a date and time for your test.   For  non-scheduling related questions, please contact the cardiac imaging nurse navigator should you have any questions/concerns: Marchia Bond, RN Navigator Cardiac Imaging [place] [facility] [phone] Office   Your physician wants you to follow-up in: [place] will receive a reminder letter in the mail two months in advance. If you don't receive a letter, please call our office to schedule the follow-up appointment.

## 2019-04-29 IMAGING — CT CT MAXILLOFACIAL W/O CM
1 series · 15 of 30 positions shown, 19 images · non-contrast
Comparison: CT of sinuses 03/13/2016

CLINICAL DATA: Chronic sinusitis, unspecified location. Bilateral
inferior turbinate partial resection 6 months ago.

EXAM:
CT MAXILLOFACIAL WITHOUT CONTRAST
TECHNIQUE: Multidetector CT imaging of the maxillofacial structures was
performed. Multiplanar CT image reconstructions were also generated.
A small metallic BB was placed on the right temple in order to
reliably differentiate right from left.

[Series 4: maxofacial soft (person_name) · axial · 0.35mm/px · z∈[-143,-35]mm · 15 of 40 slices shown, 19 images]
[im 2/40  brain]
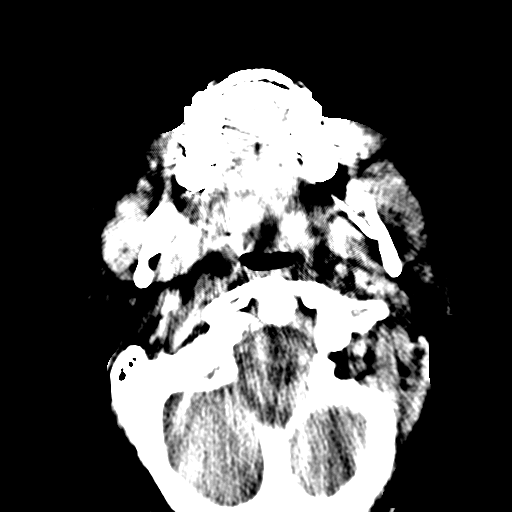
[im 2/40  bone]
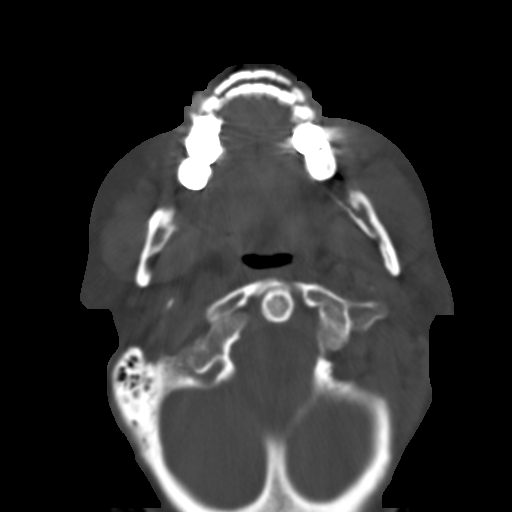
[im 5/40  bone]
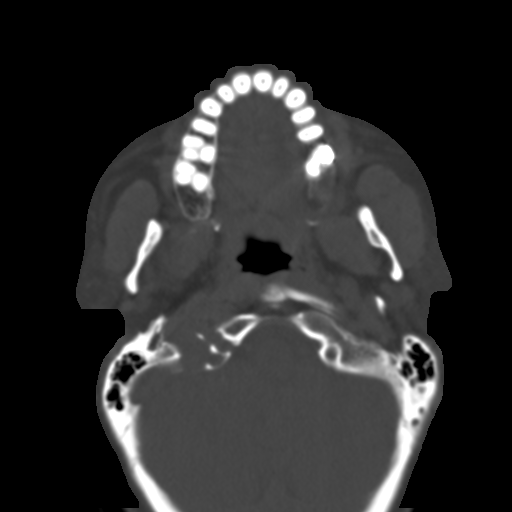
[im 7/40  bone]
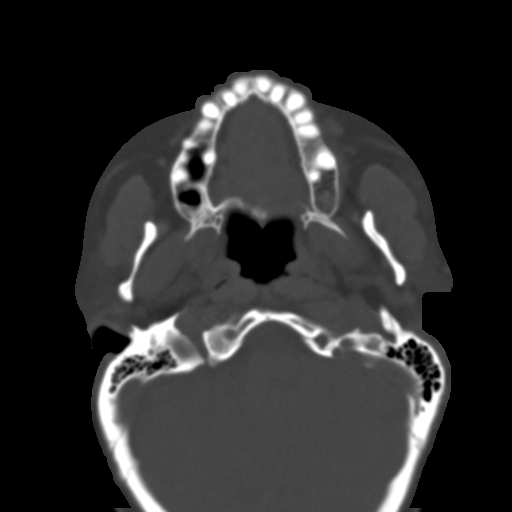
[im 10/40  bone]
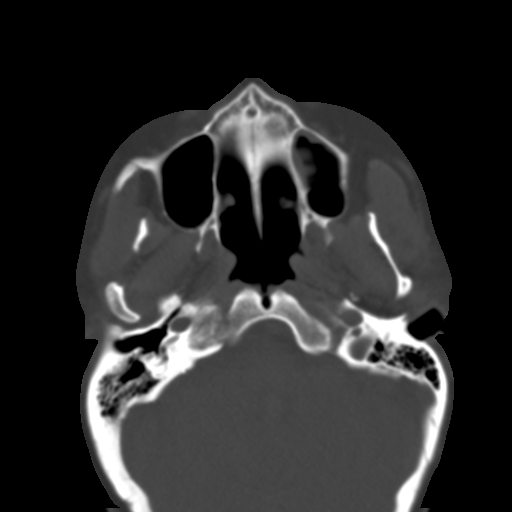
[im 13/40  brain]
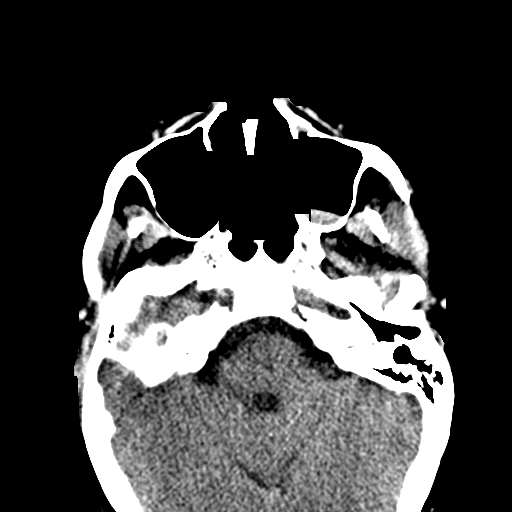
[im 13/40  bone]
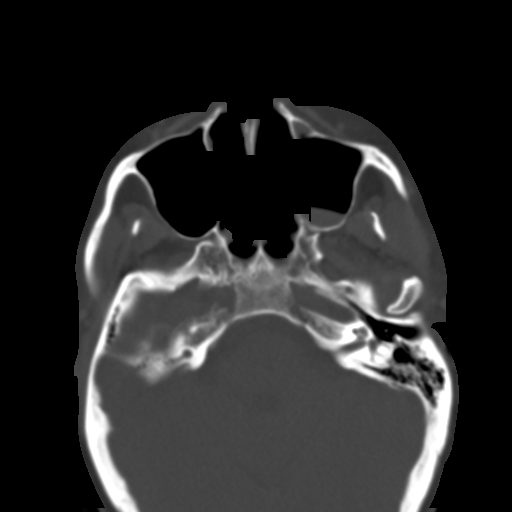
[im 15/40  bone]
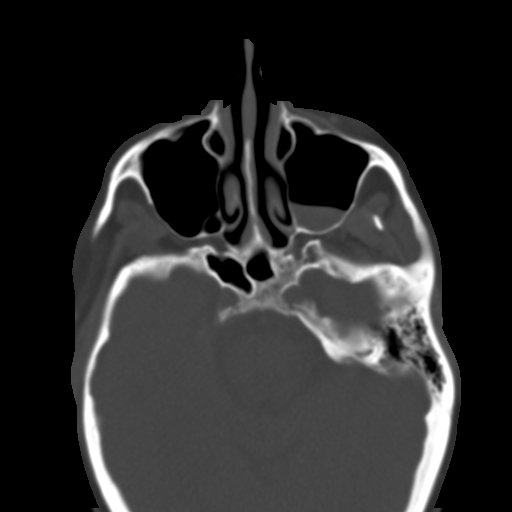
[im 18/40  bone]
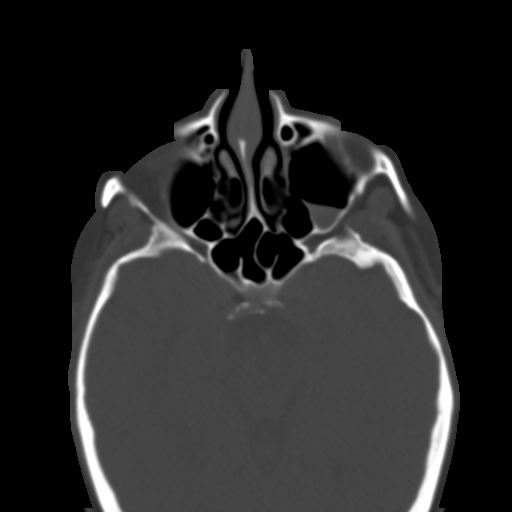
[im 21/40  bone]
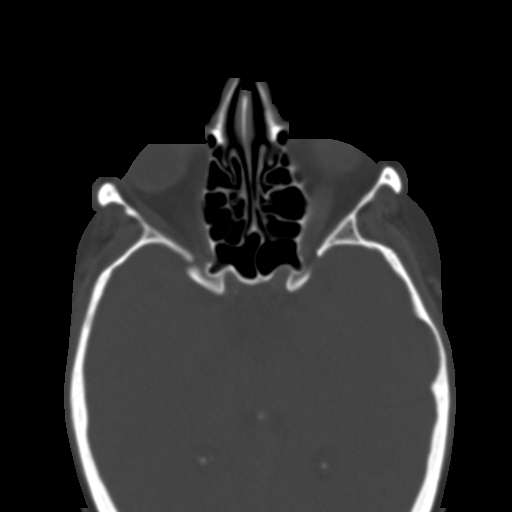
[im 22/40  brain]
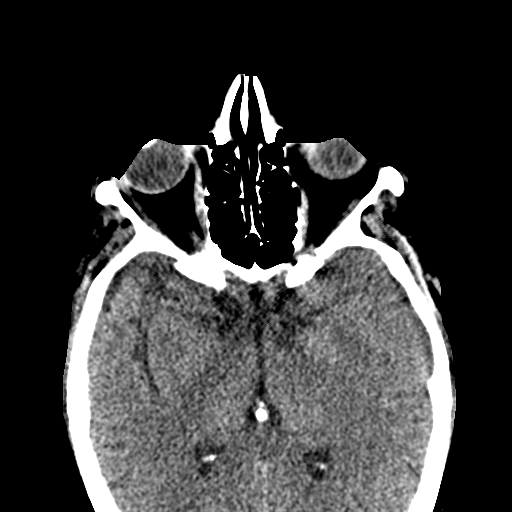
[im 22/40  bone]
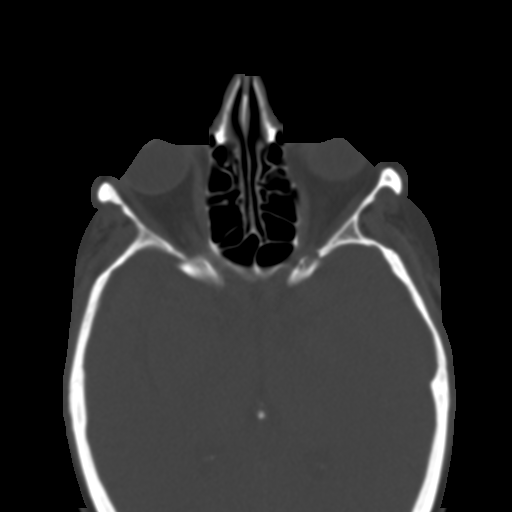
[im 25/40  bone]
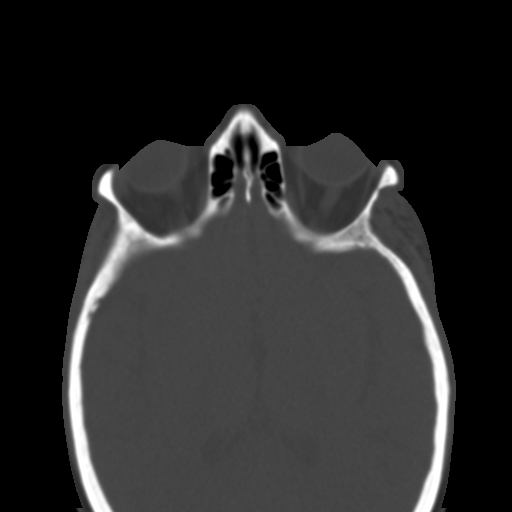
[im 27/40  bone]
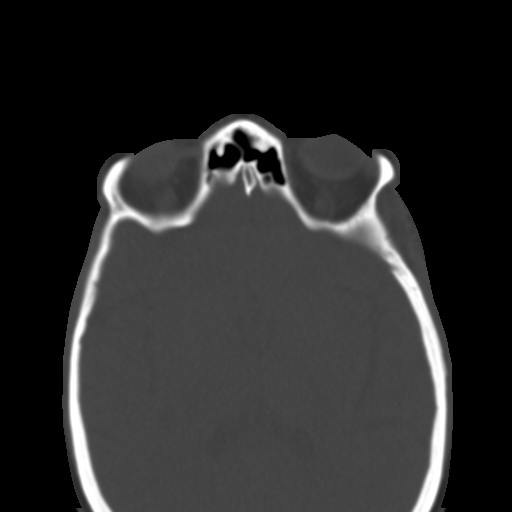
[im 30/40  bone]
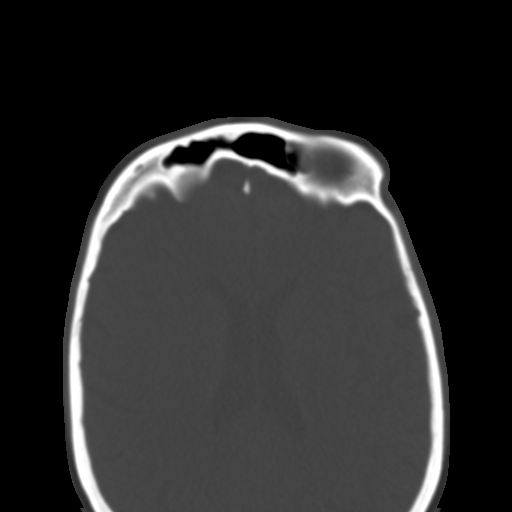
[im 33/40  brain]
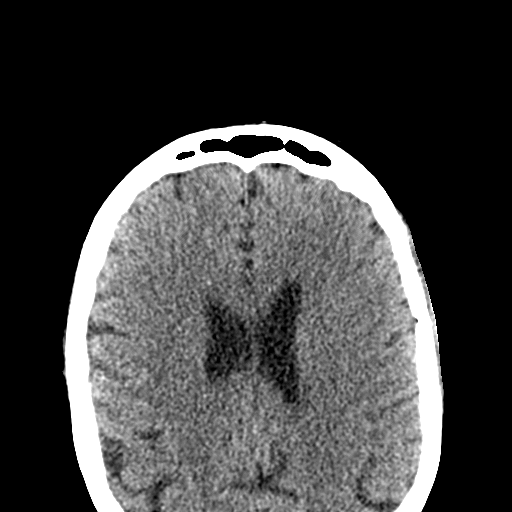
[im 33/40  bone]
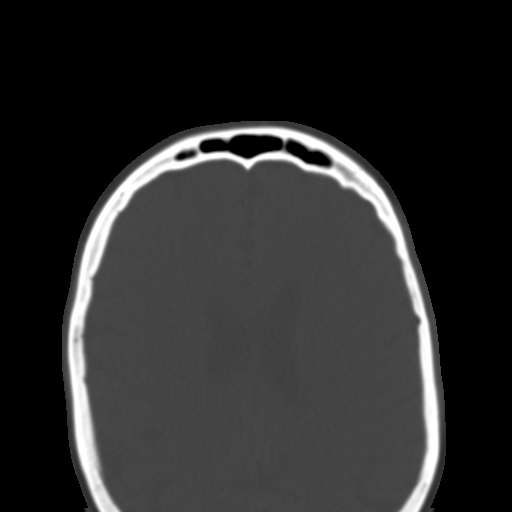
[im 35/40  bone]
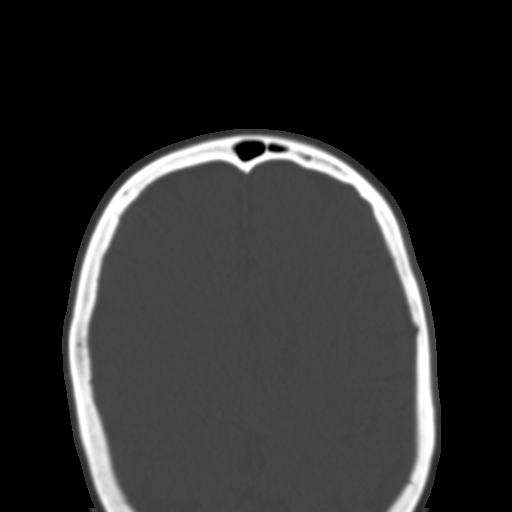
[im 38/40  bone]
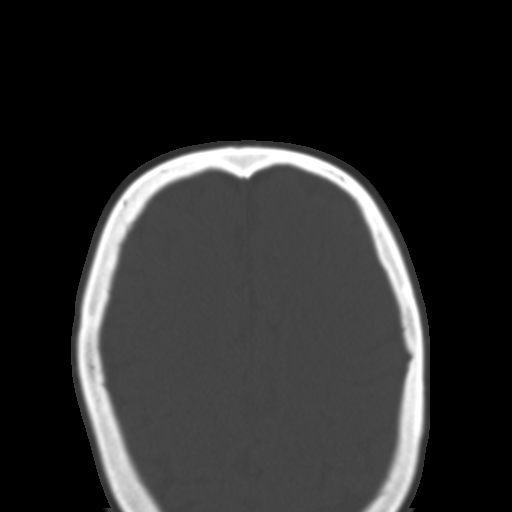

[15 of 30 positions shown; findings below may reference images not displayed]

FINDINGS: Osseous: No focal osseous lesions are present.

Orbits: The globes and orbits are unremarkable.

Sinuses: Mucosal thickening is evident along the inferior and
posterior aspect of the left maxillary sinus. The ostiomeatal
complex is partially included on the left secondary to a prominent
ethmoid air cell and spurring. The ostiomeatal complex is patent on
the right. A smaller ethmoid air cell is present on the right. There
is lateral deviation of the onset process secondary to a concha
bullosa within the right middle turbinate.

Cribriform plate is intact. The lateral lamella measures 7.9 mm on
the right and 7.3 mm on the left, compatible with Keros type 3
olfactory fossa. There is no abnormal pneumatization about the optic
nerves cavernous sinus. There is normal pneumatization about the
anterior ethmoidal artery. The remaining paranasal sinuses are
clear. The mastoid air cells and middle ear cavities are clear
bilaterally.

Partial inferior turbinectomies are noted. The nasal cavity is
clear.

Soft tissues: The soft tissues of the face are unremarkable.

Limited intracranial: Within normal limits.
IMPRESSION: 1. Interval partial inferior turbinectomies. The nasal cavity is
clear.
2. Partial obstruction of the left ostiomeatal complex with mucosal
thickening along the posterior and inferior aspect of the left
maxillary sinus.
3. Lateral deviation of the right uncinate process without
obstruction of the ostiomeatal complex.
4. The paranasal sinuses are otherwise clear.

## 2019-05-05 ENCOUNTER — Ambulatory Visit (INDEPENDENT_AMBULATORY_CARE_PROVIDER_SITE_OTHER): Payer: BC Managed Care – PPO

## 2019-05-05 ENCOUNTER — Encounter: Payer: Self-pay | Admitting: Podiatry

## 2019-05-05 ENCOUNTER — Ambulatory Visit (INDEPENDENT_AMBULATORY_CARE_PROVIDER_SITE_OTHER): Payer: BC Managed Care – PPO | Admitting: Podiatry

## 2019-05-05 ENCOUNTER — Other Ambulatory Visit: Payer: Self-pay

## 2019-05-05 DIAGNOSIS — M779 Enthesopathy, unspecified: Secondary | ICD-10-CM

## 2019-05-05 DIAGNOSIS — B351 Tinea unguium: Secondary | ICD-10-CM | POA: Diagnosis not present

## 2019-05-06 NOTE — Progress Notes (Signed)
Subjective:   Patient ID: Christine Hendrix, female   DOB: 53 y.o.   MRN: [id number]   HPI Patient presents stating she developed pain in the joint of her right foot over the last 6 weeks and also she is concerned about nail discoloration bilateral   ROS      Objective:  Physical Exam  Previous bunion doing well left with inflammatory capsulitis around the second MPJ right and discoloration of the hallux and second nails bilateral and     Assessment:  Inflammatory capsulitis second MPJ right with low-grade mycotic nail infection bilateral     Plan:  H&P reviewed both conditions and x-ray today I did sterile prep and injected 60 mg like Marcaine mixture proximal to the second MPJ and aspirated the joint getting out a small amount of clear fluid and injected quarter cc dexamethasone Kenalog and applied padding to take pressure off the joint surface.  Reappoint to reevaluate and also start formula 7 for fungal nails  X-rays indicate that there is no signs of fracture or bony pathology associated with pain she is experiencing

## 2019-05-19 DIAGNOSIS — J301 Allergic rhinitis due to pollen: Secondary | ICD-10-CM | POA: Diagnosis not present

## 2019-05-19 DIAGNOSIS — M542 Cervicalgia: Secondary | ICD-10-CM | POA: Diagnosis not present

## 2019-05-25 ENCOUNTER — Ambulatory Visit (INDEPENDENT_AMBULATORY_CARE_PROVIDER_SITE_OTHER): Payer: BC Managed Care – PPO | Admitting: Podiatry

## 2019-05-25 ENCOUNTER — Other Ambulatory Visit: Payer: Self-pay

## 2019-05-25 DIAGNOSIS — M779 Enthesopathy, unspecified: Secondary | ICD-10-CM

## 2019-05-25 NOTE — Progress Notes (Signed)
Subjective:   Patient ID: Christine Hendrix, female   DOB: 53 y.o.   MRN: [id number]   HPI Patient presents stating it seems like it is feeling better and she is not having as much pain neuro   ROS      Objective:  Physical Exam  Vascular status intact with patient's right second MPJ improving with mild discomfort upon deep palpation but nowhere near as intense     Assessment:  Inflammatory capsulitis improved right     Plan:  Reviewed condition recommended continued padding rigid bottom shoes and overall I am satisfied with reduction of inflammation and educated her on this.  Patient will be seen back to recheck as needed

## 2019-05-30 DIAGNOSIS — M25512 Pain in left shoulder: Secondary | ICD-10-CM | POA: Diagnosis not present

## 2019-06-01 DIAGNOSIS — J301 Allergic rhinitis due to pollen: Secondary | ICD-10-CM | POA: Diagnosis not present

## 2019-06-12 ENCOUNTER — Encounter: Payer: Self-pay | Admitting: Family Medicine

## 2019-07-01 ENCOUNTER — Ambulatory Visit (HOSPITAL_COMMUNITY): Payer: BC Managed Care – PPO

## 2019-07-08 DIAGNOSIS — J3489 Other specified disorders of nose and nasal sinuses: Secondary | ICD-10-CM | POA: Diagnosis not present

## 2019-07-08 DIAGNOSIS — J301 Allergic rhinitis due to pollen: Secondary | ICD-10-CM | POA: Diagnosis not present

## 2019-07-08 DIAGNOSIS — Z1231 Encounter for screening mammogram for malignant neoplasm of breast: Secondary | ICD-10-CM | POA: Diagnosis not present

## 2019-07-08 DIAGNOSIS — G43009 Migraine without aura, not intractable, without status migrainosus: Secondary | ICD-10-CM | POA: Diagnosis not present

## 2019-07-13 DIAGNOSIS — R922 Inconclusive mammogram: Secondary | ICD-10-CM | POA: Diagnosis not present

## 2019-07-13 DIAGNOSIS — N6012 Diffuse cystic mastopathy of left breast: Secondary | ICD-10-CM | POA: Diagnosis not present

## 2019-07-13 DIAGNOSIS — R928 Other abnormal and inconclusive findings on diagnostic imaging of breast: Secondary | ICD-10-CM | POA: Diagnosis not present

## 2019-07-18 ENCOUNTER — Telehealth: Payer: Self-pay | Admitting: Cardiology

## 2019-07-18 NOTE — Telephone Encounter (Signed)
Patient cancelled CT Cardiac appt, needs a call back to reschedule. Thank you!

## 2019-07-22 ENCOUNTER — Ambulatory Visit (HOSPITAL_COMMUNITY): Payer: BC Managed Care – PPO

## 2019-07-27 ENCOUNTER — Encounter: Payer: Self-pay | Admitting: Family Medicine

## 2019-08-09 DIAGNOSIS — R300 Dysuria: Secondary | ICD-10-CM | POA: Diagnosis not present

## 2019-08-09 DIAGNOSIS — D3502 Benign neoplasm of left adrenal gland: Secondary | ICD-10-CM | POA: Diagnosis not present

## 2019-08-12 ENCOUNTER — Encounter: Payer: Self-pay | Admitting: Family Medicine

## 2019-08-23 DIAGNOSIS — G43709 Chronic migraine without aura, not intractable, without status migrainosus: Secondary | ICD-10-CM | POA: Diagnosis not present

## 2019-08-23 DIAGNOSIS — G4700 Insomnia, unspecified: Secondary | ICD-10-CM | POA: Diagnosis not present

## 2019-09-01 DIAGNOSIS — L739 Follicular disorder, unspecified: Secondary | ICD-10-CM | POA: Diagnosis not present

## 2019-09-02 ENCOUNTER — Other Ambulatory Visit: Payer: Self-pay | Admitting: Family Medicine

## 2019-09-13 ENCOUNTER — Other Ambulatory Visit: Payer: Self-pay | Admitting: Family Medicine

## 2019-09-13 ENCOUNTER — Encounter: Payer: Self-pay | Admitting: Family Medicine

## 2019-09-13 DIAGNOSIS — R1031 Right lower quadrant pain: Secondary | ICD-10-CM

## 2019-09-13 DIAGNOSIS — Z1211 Encounter for screening for malignant neoplasm of colon: Secondary | ICD-10-CM

## 2019-09-19 ENCOUNTER — Encounter: Payer: Self-pay | Admitting: Gastroenterology

## 2019-09-22 ENCOUNTER — Telehealth (HOSPITAL_COMMUNITY): Payer: Self-pay | Admitting: Emergency Medicine

## 2019-09-22 MED ORDER — METOPROLOL TARTRATE 100 MG PO TABS: ORAL_TABLET | ORAL | 0 refills | Status: DC

## 2019-09-22 NOTE — Telephone Encounter (Signed)
Attempted to call patient regarding upcoming cardiac CT appointment. °Left message on voicemail with name and callback number °Anayeli Arel RN Navigator Cardiac Imaging °[facility] °[phone] Office °[phone] Cell ° °

## 2019-09-22 NOTE — Telephone Encounter (Signed)
Pt returning phone call regarding upcoming cardiac imaging study; pt verbalizes understanding of appt date/time, parking situation and where to check in, pre-test NPO status and medications ordered, and verified current allergies; name and call back number provided for further questions should they arise Rockwell Alexandria RN Navigator Cardiac Imaging [facility] [phone] office [phone] cell   Pt states she failed to pick up med in timely manner, resubmitted to her verified pharmacy. Pt verbalized understanding to take 2 hr prior to scan for CCTA HR control

## 2019-09-23 ENCOUNTER — Ambulatory Visit (HOSPITAL_COMMUNITY)
Admission: RE | Admit: 2019-09-23 | Discharge: 2019-09-23 | Disposition: A | Discharge status: Home / Self Care | Payer: BC Managed Care – PPO | Source: Ambulatory Visit | Attending: Cardiology | Admitting: Cardiology

## 2019-09-23 ENCOUNTER — Other Ambulatory Visit: Payer: Self-pay

## 2019-09-23 DIAGNOSIS — R072 Precordial pain: Secondary | ICD-10-CM | POA: Diagnosis not present

## 2019-09-23 MED ORDER — NITROGLYCERIN 0.4 MG SL SUBL
0.8000 mg | SUBLINGUAL_TABLET | Freq: Once | SUBLINGUAL | Status: AC
  Administered 2019-09-23: 0.8 mg via SUBLINGUAL

## 2019-09-23 MED ORDER — IOHEXOL 350 MG/ML SOLN
80.0000 mL | Freq: Once | INTRAVENOUS | Status: AC | PRN
  Administered 2019-09-23: 80 mL via INTRAVENOUS

## 2019-09-23 MED ORDER — NITROGLYCERIN 0.4 MG SL SUBL
SUBLINGUAL_TABLET | SUBLINGUAL | Status: AC
  Filled 2019-09-23: qty 2

## 2019-10-14 DIAGNOSIS — I4891 Unspecified atrial fibrillation: Secondary | ICD-10-CM | POA: Diagnosis not present

## 2019-10-14 DIAGNOSIS — R0600 Dyspnea, unspecified: Secondary | ICD-10-CM | POA: Diagnosis not present

## 2019-10-14 DIAGNOSIS — I341 Nonrheumatic mitral (valve) prolapse: Secondary | ICD-10-CM | POA: Diagnosis not present

## 2019-10-20 ENCOUNTER — Other Ambulatory Visit: Payer: Self-pay | Admitting: Cardiology

## 2019-10-21 DIAGNOSIS — D3502 Benign neoplasm of left adrenal gland: Secondary | ICD-10-CM | POA: Diagnosis not present

## 2019-10-21 DIAGNOSIS — Z79890 Hormone replacement therapy (postmenopausal): Secondary | ICD-10-CM | POA: Insufficient documentation

## 2019-10-21 DIAGNOSIS — I4891 Unspecified atrial fibrillation: Secondary | ICD-10-CM | POA: Diagnosis not present

## 2019-10-21 DIAGNOSIS — E039 Hypothyroidism, unspecified: Secondary | ICD-10-CM | POA: Diagnosis not present

## 2019-10-21 DIAGNOSIS — E8940 Asymptomatic postprocedural ovarian failure: Secondary | ICD-10-CM | POA: Diagnosis not present

## 2019-10-27 ENCOUNTER — Other Ambulatory Visit: Payer: Self-pay

## 2019-10-27 ENCOUNTER — Ambulatory Visit (INDEPENDENT_AMBULATORY_CARE_PROVIDER_SITE_OTHER): Payer: BC Managed Care – PPO | Admitting: Gastroenterology

## 2019-10-27 ENCOUNTER — Other Ambulatory Visit: Payer: Self-pay | Admitting: Family Medicine

## 2019-10-27 ENCOUNTER — Telehealth: Payer: Self-pay | Admitting: Gastroenterology

## 2019-10-27 ENCOUNTER — Encounter: Payer: Self-pay | Admitting: Gastroenterology

## 2019-10-27 VITALS — BP 110/82 | HR 68 | Temp 97.1°F | Ht 64.0 in | Wt 147.1 lb

## 2019-10-27 DIAGNOSIS — K5900 Constipation, unspecified: Secondary | ICD-10-CM | POA: Diagnosis not present

## 2019-10-27 DIAGNOSIS — R1084 Generalized abdominal pain: Secondary | ICD-10-CM

## 2019-10-27 DIAGNOSIS — T753XXA Motion sickness, initial encounter: Secondary | ICD-10-CM

## 2019-10-27 DIAGNOSIS — K921 Melena: Secondary | ICD-10-CM | POA: Diagnosis not present

## 2019-10-27 DIAGNOSIS — Z800 Family history of malignant neoplasm of digestive organs: Secondary | ICD-10-CM | POA: Diagnosis not present

## 2019-10-27 DIAGNOSIS — K219 Gastro-esophageal reflux disease without esophagitis: Secondary | ICD-10-CM | POA: Diagnosis not present

## 2019-10-27 MED ORDER — CLENPIQ 10-3.5-12 MG-GM -GM/160ML PO SOLN: 1.0000 | Freq: Once | ORAL | 0 refills | Status: AC

## 2019-10-27 MED ORDER — SCOPOLAMINE 1 MG/3DAYS TD PT72: 1.0000 | MEDICATED_PATCH | TRANSDERMAL | 1 refills | Status: DC

## 2019-10-27 NOTE — Patient Instructions (Signed)
You have been scheduled for an endoscopy and colonoscopy. Please follow the written instructions given to you at your visit today. Please pick up your prep supplies at the pharmacy within the next 1-3 days. If you use inhalers (even only as needed), please bring them with you on the day of your procedure. Your physician has requested that you go to www.startemmi.com and enter the access code given to you at your visit today. This web site gives a general overview about your procedure. However, you should still follow specific instructions given to you by our office regarding your preparation for the procedure.  Marland Kitchenthx

## 2019-10-27 NOTE — Progress Notes (Signed)
Chief Complaint: Family history of colon cancer, CRC screening, abdominal pain, GERD, hematochezia, change in bowel habits   Referring Provider:     Everrett Coombe, DO   HPI:     Christine Hendrix is a 54 y.o. female with history of adrenal adenoma, atrial fibrillation, MVP, hypothyroidism, referred to the Gastroenterology Clinic for evaluation of CRC screening along with discussion of multiple GI symptoms to include reflux, abdominal pain, hematochezia, change in bowel habits.  She family history of colon cancer- father diagnosed in his 64's. Brother died over the last year with presumably aggressive metastatic Colon CA.   Has long standing hx of multiple GI sxs. Was diagnosed with "colitis" in her 28's and has had multiple colonoscopies since then.  Symptoms at that time include hematochezia, change in bowel habits, and nausea/vomiting.  Last colonoscopy was ~2015, all completed at outside facilities and not available in EMR for review today.   Separately with reflux sxs, both day and night. Will sleep elevated in recliner at times. Worse with supine, EtOH (has had no EtOH in years d/t sxs), spicy foods. Index sxs of HB, regurgitation, increased throat clearing. Has trialed multiple meds over the years (Zantac, omeprazole, Zofran, promethazine, Pepto, Mylanta), but now meds without efficacy. Occasional dysphagia. No odynophagia. Sxs worse over the last year.  Was seen by Dr. Octaviano Glow at [facility] for this issue in 2017, with EGD planned at that time (no EGD report in EMR for review).  Will have intermittent mucus-like film on otherwise formed stool.  Also with intermittent small-volume BRBPR.   Motion sickness. Requesting antiemetics prior to upcoming travel.  Reports history of chronic constipation.. Previously trialed Linzess with Dr. Loreta Ave. Had diarrhea after taking for a few days, then stopped and retrialed again a few weeks later with recurrence of diarrhea, so she stopped  (took every other day). Pellet like stools with straining over the last 2 years.  Upper abdominal pain>lower abdominal pain, worse over the last year.  This can occur independent of the above other GI symptoms.  Nonradiating.  History of adrenal adenoma initially noted on CT 2016.  Was seen in [facility] on 10/11/2019.  Also seen [facility] clinic on 10/14/2019 for follow-up of atrial fibrillation-ordered 30-day event recorder with 6-week follow-up.  Prior to that, followed by Dr. Jens Som Cardiology with CT coronary on 09/23/2019.  -02/21/2019: Normal CMP   Past Medical History:  Diagnosis Date  . Arthritis    neck  . Atrial fibrillation (HCC)   . Colitis    chronic   . Colitis   . Dental crowns present    also dental implant lower right  . Family history of colon cancer   . Hyperlipidemia   . Hypothyroidism   . Kidney stone on left side 03/27/2017  . Migraines   . MVP (mitral valve prolapse)   . Nasal turbinate hypertrophy 04/2016  . Nephrolithiasis   . PONV (postoperative nausea and vomiting)   . Psoriasis   . TMJ syndrome    limited jaw opening     Past Surgical History:  Procedure Laterality Date  . ABDOMINAL HYSTERECTOMY     complete  . BUNIONECTOMY WITH HAMMERTOE RECONSTRUCTION Left 03/04/2016   4th/5th toes  . CHOLECYSTECTOMY    . COLONOSCOPY     More than 5 years. [facility]. [place]   . ESOPHAGOGASTRODUODENOSCOPY     about 5 years ago.  [facility] [employer]  . TMJ ARTHROPLASTY    . TRIGEMINAL NERVE DECOMPRESSION    . TURBINATE REDUCTION Bilateral 05/23/2016   Procedure: BILATERAL TURBINATE REDUCTION;  Surgeon: Newman Pies, MD;  Location: [facility];  Service: ENT;  Laterality: Bilateral;   Family History  Problem Relation Age of Onset  . Heart disease Father   . Heart failure Father   . Colon cancer Father        dx early 48's   . Stomach cancer Brother   . Colon cancer Brother   .  Liver cancer Brother   . Heart disease Mother   . CAD Mother   . Esophageal cancer Neg Hx    Social History   Tobacco Use  . Smoking status: Never Smoker  . Smokeless tobacco: Never Used  Substance Use Topics  . Alcohol use: No    Alcohol/week: 0.0 standard drinks  . Drug use: No   Current Outpatient Medications  Medication Sig Dispense Refill  . aspirin EC 81 MG tablet Take 1 tablet (81 mg total) by mouth daily. 90 tablet 3  . baclofen (LIORESAL) 10 MG tablet Take by mouth as needed.     . diltiazem (CARDIZEM CD) 120 MG 24 hr capsule Take 1 capsule (120 mg total) by mouth daily. 90 capsule 3  . Erenumab-aooe (AIMOVIG) 140 MG/ML SOAJ Inject into the skin every 30 (thirty) days.     Marland Kitchen estrogens, conjugated, (PREMARIN) 1.25 MG tablet Take 1.25 mg by mouth daily.    Marland Kitchen ibuprofen (ADVIL) 800 MG tablet Take 800 mg by mouth every 8 (eight) hours as needed for headache.    . levothyroxine (SYNTHROID) 50 MCG tablet TAKE 1 TABLET DAILY 90 tablet 0   No current facility-administered medications for this visit.   Allergies  Allergen Reactions  . Amoxicillin-Pot Clavulanate Nausea Only and Other (See Comments)  . Ciprofloxacin Nausea Only  . Clindamycin Nausea Only and Other (See Comments)  . Codeine Nausea And Vomiting, Other (See Comments) and Rash    Vomiting and dizziness  . Doxycycline Nausea Only and Other (See Comments)  . Quinidine Nausea Only and Other (See Comments)  . Quinine Other (See Comments)  . Quinolones Nausea And Vomiting and Other (See Comments)    Nausea, Dizziness Nausea, Dizziness Nausea, Dizziness   . Solifenacin Other (See Comments)    BLURRED VISION Other reaction(s): Hallucinations Visual disturbances     Review of Systems: All systems reviewed and negative except where noted in HPI.     Physical Exam:    Wt Readings from Last 3 Encounters:  10/27/19 147 lb 2 oz (66.7 kg)  04/27/19 144 lb 1.9 oz (65.4 kg)  02/21/19 145 lb 9.6 oz (66 kg)     BP 110/82   Pulse 68   Temp (!) 97.1 F (36.2 C)   Ht 5\' 4"  (1.626 m)   Wt 147 lb 2 oz (66.7 kg)   BMI 25.25 kg/m  Constitutional:  Pleasant, in no acute distress. Psychiatric: Normal mood and affect. Behavior is normal. EENT: Pupils normal.  Conjunctivae are normal. No scleral icterus. Neck supple. No cervical LAD. Cardiovascular: Normal rate, regular rhythm. No edema Pulmonary/chest: Effort normal and breath sounds normal. No wheezing, rales or rhonchi. Abdominal: Soft, nondistended, nontender. Bowel sounds active throughout. There are no masses palpable. No hepatomegaly. Neurological: Alert and oriented to person place and time. Skin: Skin is warm and dry. No rashes noted. Rectal: Exam deferred by patient to time  of colonoscopy.    ASSESSMENT AND PLAN;   1) GERD 2) Dysphagia  -EGD to evaluate for LES laxity, hiatal hernia, erosive esophagitis with esophageal dilation and/or esophageal biopsies as appropriate -Has trialed multiple medications the past to include PPI, H2 RA, OTC antacids.  Holding off on trial of new medications pending endoscopic findings -Continue antireflux lifestyle/dietary modifications with avoidance of exacerbating foods  3) Chronic constipation 4) Hematochezia 5) Change in bowel habits 6) Abdominal pain  -Colonoscopy to evaluate for mucosal/luminal pathology -Suspect hemorrhoids as etiology for intermittent hematochezia.  Patient declined rectal exam today.  Can perform at time of colonoscopy while sedated.  Discussed possible hemorrhoid banding pending examination -Consume 64 ounces water/day -Discussed potential repeat trial of medications for chronic constipation pending colonoscopy findings -Increase dietary fiber.  Can use fiber supplement -Random and directed colonic biopsies -Gastric and duodenal biopsies at time of EGD as above  7) Motion sickness: -Provided Rx for scopolamine patch for upcoming travel  8) Family history of colon  cancer: -Brother deceased over the last year presumably from colon cancer (aggressive malignancy involving:, Liver, stomach per patient with rapid deterioration and death shortly after diagnosis).  Father also diagnosed with colon cancer in his 10s -Colonoscopy for high risk screening as above  -Will try to obtain records of previous endoscopy reports  The indications, risks, and benefits of EGD and colonoscopy were explained to the patient in detail. Risks include but are not limited to bleeding, perforation, adverse reaction to medications, and cardiopulmonary compromise. Sequelae include but are not limited to the possibility of surgery, hositalization, and mortality. The patient verbalized understanding and wished to proceed. All questions answered, referred to scheduler and bowel prep ordered. Further recommendations pending results of the exam.      Lavena Bullion, DO, FACG  10/27/2019, 9:21 AM   Luetta Nutting, DO

## 2019-10-27 NOTE — Telephone Encounter (Signed)
CM-Plz see refill req/thx dmf 

## 2019-10-28 ENCOUNTER — Encounter: Payer: Self-pay | Admitting: Family Medicine

## 2019-10-28 DIAGNOSIS — E039 Hypothyroidism, unspecified: Secondary | ICD-10-CM | POA: Diagnosis not present

## 2019-10-28 DIAGNOSIS — D3502 Benign neoplasm of left adrenal gland: Secondary | ICD-10-CM | POA: Diagnosis not present

## 2019-10-28 LAB — TSH: TSH: 1.21 (ref 0.41–5.90)

## 2019-10-28 LAB — BASIC METABOLIC PANEL
BUN: 14 (ref 4–21)
CO2: 23 — AB (ref 13–22)
Chloride: 103 (ref 99–108)
Creatinine: 0.8 (ref 0.5–1.1)
Glucose: 102
Potassium: 4.7 (ref 3.4–5.3)
Sodium: 139 (ref 137–147)

## 2019-10-28 LAB — COMPREHENSIVE METABOLIC PANEL
GFR calc Af Amer: 100
GFR calc non Af Amer: 86

## 2019-10-31 ENCOUNTER — Telehealth: Payer: Self-pay

## 2019-10-31 MED ORDER — PANTOPRAZOLE SODIUM 40 MG PO TBEC
40.0000 mg | DELAYED_RELEASE_TABLET | Freq: Two times a day (BID) | ORAL | 0 refills | Status: DC
Start: 2019-10-31 — End: 2019-12-15

## 2019-10-31 NOTE — Telephone Encounter (Signed)
Spoke with patient and she is willing to try Protonix. Medication sent to a [place] [facility] and patient will call if there is any trouble. She verbalized understanding

## 2019-10-31 NOTE — Telephone Encounter (Signed)
Patient stated that over the weekend she experienced reflux vomiting. Said that Friday 3AM she woke up with vomit with nose and throat and it wasn't much but did wake her up and it wasn't pleasant. She woke up chocking. Then on Saturday and Sunday she was bending over and the reflux vomit came about in her throat. She said that this was a new symptom. She wants to know if there is any recommendations?

## 2019-10-31 NOTE — Telephone Encounter (Signed)
Symptoms certainly sound consistent with reflux.  Planning on EGD to evaluate further, but in the interim can treat with acid suppression therapy with Protonix 40 mg p.o. BID x4 weeks for diagnostic/therapeutic intent, with plan to then reduce to the lowest effective dose.  If she does not want to trial a new medication prior to the EGD, would be reasonable to use either OTC Pepcid on-demand or antacids, such as Tums, as needed if symptoms recur.  Otherwise, avoid eating within 2-3 hours of bedtime, avoidance of known exacerbating foods, etc.

## 2019-10-31 NOTE — Telephone Encounter (Signed)
Spoke with patient and she said she would try coupon first and call if it is too expensive

## 2019-11-11 DIAGNOSIS — G4752 REM sleep behavior disorder: Secondary | ICD-10-CM | POA: Diagnosis not present

## 2019-11-11 DIAGNOSIS — R0600 Dyspnea, unspecified: Secondary | ICD-10-CM | POA: Diagnosis not present

## 2019-11-11 DIAGNOSIS — G4719 Other hypersomnia: Secondary | ICD-10-CM | POA: Diagnosis not present

## 2019-11-11 DIAGNOSIS — I4891 Unspecified atrial fibrillation: Secondary | ICD-10-CM | POA: Diagnosis not present

## 2019-11-11 DIAGNOSIS — R0683 Snoring: Secondary | ICD-10-CM | POA: Diagnosis not present

## 2019-11-11 DIAGNOSIS — G4700 Insomnia, unspecified: Secondary | ICD-10-CM | POA: Diagnosis not present

## 2019-11-11 DIAGNOSIS — I341 Nonrheumatic mitral (valve) prolapse: Secondary | ICD-10-CM | POA: Diagnosis not present

## 2019-11-18 DIAGNOSIS — D3502 Benign neoplasm of left adrenal gland: Secondary | ICD-10-CM | POA: Diagnosis not present

## 2019-11-18 DIAGNOSIS — E278 Other specified disorders of adrenal gland: Secondary | ICD-10-CM | POA: Diagnosis not present

## 2019-11-24 ENCOUNTER — Ambulatory Visit (INDEPENDENT_AMBULATORY_CARE_PROVIDER_SITE_OTHER): Payer: BC Managed Care – PPO | Admitting: Podiatry

## 2019-11-24 ENCOUNTER — Encounter: Payer: Self-pay | Admitting: Podiatry

## 2019-11-24 ENCOUNTER — Other Ambulatory Visit: Payer: Self-pay | Admitting: Gastroenterology

## 2019-11-24 ENCOUNTER — Other Ambulatory Visit: Payer: Self-pay

## 2019-11-24 DIAGNOSIS — R0683 Snoring: Secondary | ICD-10-CM | POA: Diagnosis not present

## 2019-11-24 DIAGNOSIS — G4752 REM sleep behavior disorder: Secondary | ICD-10-CM | POA: Diagnosis not present

## 2019-11-24 DIAGNOSIS — B351 Tinea unguium: Secondary | ICD-10-CM

## 2019-11-24 DIAGNOSIS — G4719 Other hypersomnia: Secondary | ICD-10-CM | POA: Diagnosis not present

## 2019-11-24 DIAGNOSIS — G4700 Insomnia, unspecified: Secondary | ICD-10-CM | POA: Diagnosis not present

## 2019-11-24 MED ORDER — FLUCONAZOLE 150 MG PO TABS
150.0000 mg | ORAL_TABLET | Freq: Once | ORAL | 0 refills | Status: AC
Start: 2019-11-24 — End: 2019-11-24

## 2019-11-25 DIAGNOSIS — H04123 Dry eye syndrome of bilateral lacrimal glands: Secondary | ICD-10-CM | POA: Diagnosis not present

## 2019-11-25 NOTE — Progress Notes (Signed)
Subjective:   Patient ID: Christine Hendrix, female   DOB: 54 y.o.   MRN: [id number]   HPI Patient presents stating that I developed discoloration of the right big toenail on my left foot and I did irritation of the end of my second digit left foot which I am not sure if it is the nail.  States she is concerned about fungus and did not do well with oral Lamisil in the past   ROS      Objective:  Physical Exam  Neurovascular status intact with patient noted to have discoloration distal two thirds nail bed left lateral side and also noted to have distal keratotic tissue formation digit to left with elongated digit with mild discoloration of the right hallux previous     Assessment:  Probability for low-grade localized fungal infection of the left hallux nail with possibility of trauma being part of the problem along with digital deformity second digit left creating distal keratotic tissue formation     Plan:  H&P reviewed both conditions.  For the nail we discussed different treatment options and we are any utilized a once a week Diflucan which she should tolerate well along with laser therapy and letting the nail air out.  For the toe I did discuss possibility shortening the second digit and explained the procedure if symptoms were to persist and she will utilize a pad which was dispensed today and was instructed on possibility for surgical intervention in future

## 2019-12-02 ENCOUNTER — Encounter: Payer: BC Managed Care – PPO | Admitting: Gastroenterology

## 2019-12-06 DIAGNOSIS — H1013 Acute atopic conjunctivitis, bilateral: Secondary | ICD-10-CM | POA: Diagnosis not present

## 2019-12-08 ENCOUNTER — Telehealth: Payer: Self-pay | Admitting: Gastroenterology

## 2019-12-09 NOTE — Telephone Encounter (Signed)
Pt called again to f/u on rf for pantoprazole. She states that she finished it two days ago and she has been miserable. She does not want to go through the weekend without it.

## 2019-12-12 ENCOUNTER — Other Ambulatory Visit: Payer: Self-pay | Admitting: Gastroenterology

## 2019-12-12 DIAGNOSIS — D3502 Benign neoplasm of left adrenal gland: Secondary | ICD-10-CM | POA: Diagnosis not present

## 2019-12-14 ENCOUNTER — Telehealth: Payer: Self-pay

## 2019-12-14 NOTE — Telephone Encounter (Signed)
error 

## 2019-12-14 NOTE — Telephone Encounter (Signed)
I have tried to reach patient to gather further information regarding symptoms. It appears patient cancelled initial procedure date and is reschedule for 01/13/20 (Endo/colon).  In reviewing your note you prescribed only for four weeks.  Please advise on a refill for Pantoprazole.  Thanks, Malachi Bonds

## 2019-12-14 NOTE — Telephone Encounter (Signed)
Spoke with patient apologized for delay in response.  I told her I have sent Dr. Barron Alvine a message regarding a refill and will send to pharmacy as soon as I get a response.

## 2019-12-15 ENCOUNTER — Encounter: Payer: Self-pay | Admitting: Family Medicine

## 2019-12-15 ENCOUNTER — Other Ambulatory Visit: Payer: Self-pay

## 2019-12-15 MED ORDER — PANTOPRAZOLE SODIUM 40 MG PO TBEC: 40.0000 mg | DELAYED_RELEASE_TABLET | Freq: Every day | ORAL | 5 refills | Status: DC

## 2019-12-15 NOTE — Telephone Encounter (Signed)
Sounds like the trial of Protonix 40 mg bid was working.  Plan to resume Protonix 40 mg daily to see if this still controls her reflux symptoms, and proceed with EGD/colonoscopy as scheduled.  If daily dosing does not control her symptoms, can plan to resume bid dosing.  Supply 90-day with RF 5.

## 2019-12-15 NOTE — Telephone Encounter (Signed)
Left message on patients voicemail regarding Protonix.  Advised that prescription has been sent to pharmacy.  Pt is to try once daily to see if this controls symptoms.  If not, then can resume at twice daily.  Pt to return call if she has further questions.

## 2019-12-16 ENCOUNTER — Encounter: Payer: Self-pay | Admitting: Family Medicine

## 2019-12-16 ENCOUNTER — Telehealth (INDEPENDENT_AMBULATORY_CARE_PROVIDER_SITE_OTHER): Payer: BC Managed Care – PPO | Admitting: Family Medicine

## 2019-12-16 DIAGNOSIS — E039 Hypothyroidism, unspecified: Secondary | ICD-10-CM

## 2019-12-16 MED ORDER — LEVOTHYROXINE SODIUM 50 MCG PO TABS: 50.0000 ug | ORAL_TABLET | Freq: Every day | ORAL | 2 refills | Status: DC

## 2019-12-16 NOTE — Progress Notes (Signed)
Christine Hendrix - 54 y.o. female MRN [id number]  Date of birth: 1966/01/05   This visit type was conducted due to national recommendations for restrictions regarding the COVID-19 Pandemic (e.g. social distancing).  This format is felt to be most appropriate for this patient at this time.  All issues noted in this document were discussed and addressed.  No physical exam was performed (except for noted visual exam findings with Video Visits).  I discussed the limitations of evaluation and management by telemedicine and the availability of in person appointments. The patient expressed understanding and agreed to proceed.  I connected with@ on 12/16/19 at  8:30 AM EDT by a video enabled telemedicine application and verified that I am speaking with the correct person using two identifiers.  Present at visit: Everrett Coombe, DO [employer]   Patient Location: Home [address] [place] [address]   Provider location:   [facility]  Chief Complaint  Patient presents with  . Medication Refill    HPI  Christine Hendrix is a 54 y.o. female who presents via audio/video conferencing for a telehealth visit today.  She is following up today for hypothyroidism.  She has had recent TSH in 10/2019 drawn by endocrinology as part of adrenal adenoma work up.  TSH wnl.  She does report feeling fatigued and has had some weight gain.  She is taking levothyroxine daily on empty stomach.    ROS:  A comprehensive ROS was completed and negative except as noted per HPI  Past Medical History:  Diagnosis Date  . Arthritis    neck  . Atrial fibrillation (HCC)   . Colitis    chronic   . Colitis   . Dental crowns present    also dental implant lower right  . Family history of colon cancer   . Hyperlipidemia   . Hypothyroidism   . Kidney stone on left side 03/27/2017  . Migraines   . MVP (mitral valve prolapse)   . Nasal turbinate hypertrophy 04/2016  . Nephrolithiasis   . PONV (postoperative nausea and vomiting)    . Psoriasis   . TMJ syndrome    limited jaw opening    Past Surgical History:  Procedure Laterality Date  . ABDOMINAL HYSTERECTOMY     complete  . BUNIONECTOMY WITH HAMMERTOE RECONSTRUCTION Left 03/04/2016   4th/5th toes  . CHOLECYSTECTOMY    . COLONOSCOPY     More than 5 years. [facility] [place]   . ESOPHAGOGASTRODUODENOSCOPY     about 5 years ago. [facility] [employer]  . TMJ ARTHROPLASTY    . TRIGEMINAL NERVE DECOMPRESSION    . TURBINATE REDUCTION Bilateral 05/23/2016   Procedure: BILATERAL TURBINATE REDUCTION;  Surgeon: Newman Pies, MD;  Location: [facility];  Service: ENT;  Laterality: Bilateral;    Family History  Problem Relation Age of Onset  . Heart disease Father   . Heart failure Father   . Colon cancer Father        dx early 18's   . Stomach cancer Brother   . Colon cancer Brother   . Liver cancer Brother   . Heart disease Mother   . CAD Mother   . Esophageal cancer Neg Hx     Social History   Socioeconomic History  . Marital status: Married    Spouse name: Not on file  . Number of children: Not on file  . Years of education: Not on file  . Highest education  level: Not on file  Occupational History  . 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 Smoking status: Never Smoker  . Smokeless tobacco: Never Used  Vaping Use  . Vaping Use: Never used  Substance and Sexual Activity  . Alcohol use: No    Alcohol/week: 0.0 standard drinks  . Drug use: No  . Sexual activity: Not on file  Other Topics Concern  . Not on file  Social History Narrative  . Not on file   Social Determinants of Health   Financial Resource Strain:   . Difficulty of Paying Living Expenses:   Food Insecurity:   . Worried About Programme researcher, broadcasting/film/video in the Last Year:   . Barista in the Last Year:   Transportation Needs:   . Freight forwarder (Medical):   Marland Kitchen Lack of Transportation  (Non-Medical):   Physical Activity:   . Days of Exercise per Week:   . Minutes of Exercise per Session:   Stress:   . Feeling of Stress :   Social Connections:   . Frequency of Communication with Friends and Family:   . Frequency of Social Gatherings with Friends and Family:   . Attends Religious Services:   . Active Member of Clubs or Organizations:   . Attends Banker Meetings:   Marland Kitchen Marital Status:   Intimate Partner Violence:   . Fear of Current or Ex-Partner:   . Emotionally Abused:   Marland Kitchen Physically Abused:   . Sexually Abused:      Current Outpatient Medications:  .  aspirin EC 81 MG tablet, Take 1 tablet (81 mg total) by mouth daily., Disp: 90 tablet, Rfl: 3 .  azelastine (OPTIVAR) 0.05 % ophthalmic solution, , Disp: , Rfl:  .  dexamethasone (DECADRON) 1 MG tablet, Take by mouth., Disp: , Rfl:  .  diltiazem (CARDIZEM CD) 120 MG 24 hr capsule, Take 1 capsule (120 mg total) by mouth daily., Disp: 90 capsule, Rfl: 3 .  Erenumab-aooe (AIMOVIG) 140 MG/ML SOAJ, Inject into the skin every 30 (thirty) days. , Disp: , Rfl:  .  estrogens, conjugated, (PREMARIN) 1.25 MG tablet, Take 1.25 mg by mouth daily., Disp: , Rfl:  .  fluconazole (DIFLUCAN) 150 MG tablet, Take by mouth., Disp: , Rfl:  .  ibuprofen (ADVIL) 800 MG tablet, Take 800 mg by mouth every 8 (eight) hours as needed for headache., Disp: , Rfl:  .  levothyroxine (SYNTHROID) 50 MCG tablet, Take 1 tablet (50 mcg total) by mouth daily., Disp: 90 tablet, Rfl: 2 .  loteprednol (LOTEMAX) 0.5 % ophthalmic suspension, SMARTSIG:In Eye(s), Disp: , Rfl:  .  ondansetron (ZOFRAN-ODT) 8 MG disintegrating tablet, Take 8 mg by mouth 3 (three) times daily., Disp: , Rfl:  .  pantoprazole (PROTONIX) 40 MG tablet, Take 1 tablet (40 mg total) by mouth daily., Disp: 90 tablet, Rfl: 5 .  RESTASIS 0.05 % ophthalmic emulsion, SMARTSIG:1 Drop(s) In Eye(s) Every 12 Hours, Disp: , Rfl:  .  topiramate ER (QUDEXY XR) 50 MG CS24 sprinkle capsule,  Take 50 mg by mouth at bedtime., Disp: , Rfl:   EXAM:  VITALS per patient if applicable: Wt 144 lb (65.3 kg)   BMI 24.72 kg/m   GENERAL: alert, oriented, appears well and in no acute distress  HEENT: atraumatic, conjunttiva clear, no obvious abnormalities on inspection of external nose and ears  NECK: normal movements of the head and neck  LUNGS: on  inspection no signs of respiratory distress, breathing rate appears normal, no obvious gross SOB, gasping or wheezing  CV: no obvious cyanosis  MS: moves all visible extremities without noticeable abnormality  PSYCH/NEURO: pleasant and cooperative, no obvious depression or anxiety, speech and thought processing grossly intact  ASSESSMENT AND PLAN:  Discussed the following assessment and plan:  Hypothyroidism Some weight gain and fatigue but doesn't seem to be related to thyroid as her recent TSH is normal.  Will continue levothyroxine at current dose.  Recheck in 6 months.      I discussed the assessment and treatment plan with the patient. The patient was provided an opportunity to ask questions and all were answered. The patient agreed with the plan and demonstrated an understanding of the instructions.   The patient was advised to call back or seek an in-person evaluation if the symptoms worsen or if the condition fails to improve as anticipated.    Everrett Coombe, DO

## 2019-12-16 NOTE — Assessment & Plan Note (Signed)
Some weight gain and fatigue but doesn't seem to be related to thyroid as her recent TSH is normal.  Will continue levothyroxine at current dose.  Recheck in 6 months.

## 2019-12-22 DIAGNOSIS — J301 Allergic rhinitis due to pollen: Secondary | ICD-10-CM | POA: Diagnosis not present

## 2019-12-23 DIAGNOSIS — I4891 Unspecified atrial fibrillation: Secondary | ICD-10-CM | POA: Diagnosis not present

## 2019-12-23 DIAGNOSIS — I341 Nonrheumatic mitral (valve) prolapse: Secondary | ICD-10-CM | POA: Diagnosis not present

## 2019-12-30 ENCOUNTER — Ambulatory Visit (INDEPENDENT_AMBULATORY_CARE_PROVIDER_SITE_OTHER): Payer: BC Managed Care – PPO | Admitting: *Deleted

## 2019-12-30 ENCOUNTER — Other Ambulatory Visit: Payer: Self-pay

## 2019-12-30 DIAGNOSIS — B351 Tinea unguium: Secondary | ICD-10-CM

## 2019-12-30 NOTE — Progress Notes (Signed)
Patient presents today for the 1st laser treatment. Diagnosed with mycotic nail infection by Dr. Charlsie Merles.    Toenail most affected hallux nails bilateral (L>R). She is concerned about the thickening of the 2nd nail left, but I do believe she has more of a dystrophy due to structural deformity of the toe.  All other systems are negative.  Nails were filed thin. Laser therapy was administered to 1st toenails bilateral and patient tolerated the treatment well. All safety precautions were in place.    Follow up in 4 weeks for laser # 2.  Picture of nails taken today to document visual progress

## 2020-01-11 DIAGNOSIS — N6012 Diffuse cystic mastopathy of left breast: Secondary | ICD-10-CM | POA: Diagnosis not present

## 2020-01-11 DIAGNOSIS — R922 Inconclusive mammogram: Secondary | ICD-10-CM | POA: Diagnosis not present

## 2020-01-11 DIAGNOSIS — R928 Other abnormal and inconclusive findings on diagnostic imaging of breast: Secondary | ICD-10-CM | POA: Diagnosis not present

## 2020-01-12 ENCOUNTER — Telehealth: Payer: Self-pay | Admitting: Gastroenterology

## 2020-01-12 NOTE — Telephone Encounter (Signed)
Pt is scheduled to see Dr. Barron Alvine on 01/13/20 at 3pm. Pt wanted to know if she could take her ibuprofen and sudafed the day of the procedure if needed. Spoke with admitting RN, and advised patient that she could take her medications if needed. Patient advised to call office back if she had any other concerns. Pt verbalized understanding.

## 2020-01-12 NOTE — Telephone Encounter (Signed)
Patient has questions about OTC medications that she takes. Please call her.

## 2020-01-13 ENCOUNTER — Other Ambulatory Visit: Payer: Self-pay

## 2020-01-13 ENCOUNTER — Ambulatory Visit (AMBULATORY_SURGERY_CENTER): Payer: BC Managed Care – PPO | Admitting: Gastroenterology

## 2020-01-13 ENCOUNTER — Encounter: Payer: Self-pay | Admitting: Gastroenterology

## 2020-01-13 VITALS — BP 144/81 | HR 72 | Temp 97.8°F | Resp 15 | Ht 64.0 in | Wt 147.0 lb

## 2020-01-13 DIAGNOSIS — K921 Melena: Secondary | ICD-10-CM

## 2020-01-13 DIAGNOSIS — Z800 Family history of malignant neoplasm of digestive organs: Secondary | ICD-10-CM

## 2020-01-13 DIAGNOSIS — K640 First degree hemorrhoids: Secondary | ICD-10-CM | POA: Diagnosis not present

## 2020-01-13 DIAGNOSIS — R194 Change in bowel habit: Secondary | ICD-10-CM

## 2020-01-13 DIAGNOSIS — K219 Gastro-esophageal reflux disease without esophagitis: Secondary | ICD-10-CM

## 2020-01-13 DIAGNOSIS — K5900 Constipation, unspecified: Secondary | ICD-10-CM | POA: Diagnosis not present

## 2020-01-13 DIAGNOSIS — R1319 Other dysphagia: Secondary | ICD-10-CM

## 2020-01-13 DIAGNOSIS — K319 Disease of stomach and duodenum, unspecified: Secondary | ICD-10-CM | POA: Diagnosis not present

## 2020-01-13 DIAGNOSIS — K2970 Gastritis, unspecified, without bleeding: Secondary | ICD-10-CM

## 2020-01-13 DIAGNOSIS — R1310 Dysphagia, unspecified: Secondary | ICD-10-CM

## 2020-01-13 DIAGNOSIS — K2990 Gastroduodenitis, unspecified, without bleeding: Secondary | ICD-10-CM

## 2020-01-13 DIAGNOSIS — R1084 Generalized abdominal pain: Secondary | ICD-10-CM

## 2020-01-13 DIAGNOSIS — K3189 Other diseases of stomach and duodenum: Secondary | ICD-10-CM | POA: Diagnosis not present

## 2020-01-13 MED ORDER — SODIUM CHLORIDE 0.9 % IV SOLN: 500.0000 mL | Freq: Once | INTRAVENOUS | Status: DC

## 2020-01-13 NOTE — Patient Instructions (Signed)
Please read handouts provided. Continue present medications. Await pathology results. Soft diet today, advance slowly tomorrow to regular diet. Repeat colonoscopy in 5 years. Return to GI office as needed.       YOU HAD AN ENDOSCOPIC PROCEDURE TODAY AT THE [facility]:   Refer to the procedure report that was given to you for any specific questions about what was found during the examination.  If the procedure report does not answer your questions, please call your gastroenterologist to clarify.  If you requested that your care partner not be given the details of your procedure findings, then the procedure report has been included in a sealed envelope for you to review at your convenience later.  YOU SHOULD EXPECT: Some feelings of bloating in the abdomen. Passage of more gas than usual.  Walking can help get rid of the air that was put into your GI tract during the procedure and reduce the bloating. If you had a lower endoscopy (such as a colonoscopy or flexible sigmoidoscopy) you may notice spotting of blood in your stool or on the toilet paper. If you underwent a bowel prep for your procedure, you may not have a normal bowel movement for a few days.  Please Note:  You might notice some irritation and congestion in your nose or some drainage.  This is from the oxygen used during your procedure.  There is no need for concern and it should clear up in a day or so.  SYMPTOMS TO REPORT IMMEDIATELY:   Following lower endoscopy (colonoscopy or flexible sigmoidoscopy):  Excessive amounts of blood in the stool  Significant tenderness or worsening of abdominal pains  Swelling of the abdomen that is new, acute  Fever of 100F or higher   Following upper endoscopy (EGD)  Vomiting of blood or coffee ground material  New chest pain or pain under the shoulder blades  Painful or persistently difficult swallowing  New shortness of breath  Fever of 100F or higher  Black,  tarry-looking stools  For urgent or emergent issues, a gastroenterologist can be reached at any hour by calling [phone]. Do not use MyChart messaging for urgent concerns.    DIET:  We do recommend a small meal at first, but then you may proceed to your regular diet.  Drink plenty of fluids but you should avoid alcoholic beverages for 24 hours.  ACTIVITY:  You should plan to take it easy for the rest of today and you should NOT DRIVE or use heavy machinery until tomorrow (because of the sedation medicines used during the test).    FOLLOW UP: Our staff will call the number listed on your records 48-72 hours following your procedure to check on you and address any questions or concerns that you may have regarding the information given to you following your procedure. If we do not reach you, we will leave a message.  We will attempt to reach you two times.  During this call, we will ask if you have developed any symptoms of COVID 19. If you develop any symptoms (ie: fever, flu-like symptoms, shortness of breath, cough etc.) before then, please call [phone].  If you test positive for Covid 19 in the 2 weeks post procedure, please call and report this information to [place].    If any biopsies were taken you will be contacted by phone or by letter within the next 1-3 weeks.  Please call us at [phone] if you have not heard about the biopsies in  3 weeks.    SIGNATURES/CONFIDENTIALITY: You and/or your care partner have signed paperwork which will be entered into your electronic medical record.  These signatures attest to the fact that that the information above on your After Visit Summary has been reviewed and is understood.  Full responsibility of the confidentiality of this discharge information lies with you and/or your care-partner.

## 2020-01-13 NOTE — Op Note (Signed)
[facility] Patient Name: Christine Hendrix Procedure Date: 01/13/2020 2:00 PM MRN: [id number] Endoscopist: Doristine Locks , MD Age: 54 Referring MD:  Date of Birth: [birth year] Gender: Female Account #: [id number] Procedure:                Colonoscopy Indications:              Screening in patient at increased risk: Colorectal                            cancer in father before age 20, Screening in                            patient at increased risk: Suspected Colorectal                            cancer in brother before age 62                           Additionally, she has generalized abdominal pain,                            intermittent hematochezia, constipation and changes                            in bowel habits. Medicines:                Monitored Anesthesia Care Procedure:                Pre-Anesthesia Assessment:                           - Prior to the procedure, a History and Physical                            was performed, and patient medications and                            allergies were reviewed. The patient's tolerance of                            previous anesthesia was also reviewed. The risks                            and benefits of the procedure and the sedation                            options and risks were discussed with the patient.                            All questions were answered, and informed consent                            was obtained. Prior Anticoagulants: The patient has  taken no previous anticoagulant or antiplatelet                            agents. ASA Grade Assessment: II - A patient with                            mild systemic disease. After reviewing the risks                            and benefits, the patient was deemed in                            satisfactory condition to undergo the procedure.                           After obtaining informed consent, the colonoscope                             was passed under direct vision. Throughout the                            procedure, the patient's blood pressure, pulse, and                            oxygen saturations were monitored continuously. The                            Colonoscope was introduced through the anus and                            advanced to the the cecum, identified by                            appendiceal orifice and ileocecal valve. The                            colonoscopy was performed without difficulty. The                            patient tolerated the procedure well. The quality                            of the bowel preparation was excellent. The                            ileocecal valve, appendiceal orifice, and rectum                            were photographed. Scope In: 2:33:47 PM Scope Out: 2:51:30 PM Scope Withdrawal Time: 0 hours 13 minutes 19 seconds  Total Procedure Duration: 0 hours 17 minutes 43 seconds  Findings:                 The perianal and digital rectal examinations were  normal.                           The colon appeared normal.                           Non-bleeding internal hemorrhoids were found during                            retroflexion. The hemorrhoids were small and Grade                            I (internal hemorrhoids that do not prolapse). Complications:            No immediate complications. Estimated Blood Loss:     Estimated blood loss: none. Impression:               - The entire examined colon is normal.                           - Non-bleeding internal hemorrhoids.                           - No specimens collected. Recommendation:           - Patient has a contact number available for                            emergencies. The signs and symptoms of potential                            delayed complications were discussed with the                            patient. Return to normal activities tomorrow.                             Written discharge instructions were provided to the                            patient.                           - Continue present medications.                           - Repeat colonoscopy in 5 years for screening                            purposes due to family history.                           - Return to GI office PRN.                           - Internal hemorrhoids were noted on this study and  may be amenable to hemorrhoid band ligation. If you                            are interested in further treatment of these                            hemorrhoids with band ligation, please contact my                            clinic to set up an appointment for evaluation and                            treatment. Doristine LocksVito Mickael Mcnutt, MD 01/13/2020 3:03:11 PM

## 2020-01-13 NOTE — Progress Notes (Deleted)
PT taken to PACU. Monitors in place. VSS. Report given to RN. 

## 2020-01-13 NOTE — Op Note (Signed)
[facility] Patient Name: Christine Hendrix Procedure Date: 01/13/2020 2:01 PM MRN: [id number] Endoscopist: Doristine Locks , MD Age: 54 Referring MD:  Date of Birth: Jun 15, 1965 Gender: Female Account #: [id number] Procedure:                Upper GI endoscopy Indications:              Generalized abdominal pain, Dysphagia, Suspected                            esophageal reflux Medicines:                Monitored Anesthesia Care Procedure:                Pre-Anesthesia Assessment:                           - Prior to the procedure, a History and Physical                            was performed, and patient medications and                            allergies were reviewed. The patient's tolerance of                            previous anesthesia was also reviewed. The risks                            and benefits of the procedure and the sedation                            options and risks were discussed with the patient.                            All questions were answered, and informed consent                            was obtained. Prior Anticoagulants: The patient has                            taken no previous anticoagulant or antiplatelet                            agents. ASA Grade Assessment: II - A patient with                            mild systemic disease. After reviewing the risks                            and benefits, the patient was deemed in                            satisfactory condition to undergo the procedure.  After obtaining informed consent, the endoscope was                            passed under direct vision. Throughout the                            procedure, the patient's blood pressure, pulse, and                            oxygen saturations were monitored continuously. The                            Endoscope was introduced through the mouth, and                            advanced to the second part of duodenum.  The upper                            GI endoscopy was accomplished without difficulty.                            The patient tolerated the procedure well. Scope In: Scope Out: Findings:                 The examined esophagus was normal. The scope was                            withdrawn. Dilation was performed with a Maloney                            dilator with mild resistance at 54 Fr. The dilation                            site was examined following endoscope reinsertion                            and showed mild mucosal disruption at 18 cm,                            consistent with successful dilation of a subtle                            upper esophageal stricture. Estimated blood loss                            was minimal.                           The Z-line was regular and was found 40 cm from the                            incisors.                           The gastroesophageal  flap valve was visualized                            endoscopically and classified as Hill Grade III                            (minimal fold, loose to endoscope, hiatal hernia                            likely).                           Patchy mild inflammation characterized by erythema                            was found in the gastric antrum. Biopsies were                            taken with a cold forceps for Helicobacter pylori                            testing. Estimated blood loss was minimal. The                            remainder of the stomach was normal appearing.                            Additional biopsies obtained from the gastric body                            to evaluate for H pylori.                           The duodenal bulb, first portion of the duodenum                            and second portion of the duodenum were normal.                            Biopsies for histology were taken with a cold                            forceps for evaluation of celiac disease.  Estimated                            blood loss was minimal. Complications:            No immediate complications. Estimated Blood Loss:     Estimated blood loss was minimal. Impression:               - Normal esophagus. Dilated.                           - Z-line regular, 40 cm from the incisors.                           -  Gastroesophageal flap valve classified as Hill                            Grade III (minimal fold, loose to endoscope, hiatal                            hernia likely).                           - Gastritis. Biopsied.                           - Normal duodenal bulb, first portion of the                            duodenum and second portion of the duodenum.                            Biopsied. Recommendation:           - Patient has a contact number available for                            emergencies. The signs and symptoms of potential                            delayed complications were discussed with the                            patient. Return to normal activities tomorrow.                            Written discharge instructions were provided to the                            patient.                           - Soft diet today and advance slowly tomorrow per                            post dilation protocol.                           - Continue present medications.                           - Await pathology results.                           - Perform a colonoscopy today. Doristine LocksVito Gita Dilger, MD 01/13/2020 2:58:35 PM

## 2020-01-13 NOTE — Progress Notes (Signed)
PT taken to PACU. Monitors in place. VSS. Report given to RN. 

## 2020-01-13 NOTE — Progress Notes (Signed)
Pt's states no medical or surgical changes since previsit or office visit.  CW - vitals 

## 2020-01-13 NOTE — Progress Notes (Signed)
Called to room to assist during endoscopic procedure.  Patient ID and intended procedure confirmed with present staff. Received instructions for my participation in the procedure from the performing physician.  

## 2020-01-17 ENCOUNTER — Telehealth: Payer: Self-pay

## 2020-01-17 ENCOUNTER — Encounter: Payer: Self-pay | Admitting: Family Medicine

## 2020-01-17 ENCOUNTER — Telehealth: Payer: Self-pay | Admitting: *Deleted

## 2020-01-17 NOTE — Telephone Encounter (Signed)
°  Follow up Call-  Call back number 01/13/2020  Post procedure Call Back phone  # [phone]  Permission to leave phone message Yes  Some recent data might be hidden     Patient questions:  Do you have a fever, pain , or abdominal swelling? No. Pain Score  0 *  Have you tolerated food without any problems? Yes.    Have you been able to return to your normal activities? Yes.    Do you have any questions about your discharge instructions: Diet   No. Medications  No. Follow up visit  No.  Do you have questions or concerns about your Care? No.  Actions: * If pain score is 4 or above: No action needed, pain <4.  1. Have you developed a fever since your procedure? no  2.   Have you had an respiratory symptoms (SOB or cough) since your procedure? no  3.   Have you tested positive for COVID 19 since your procedure no  4.   Have you had any family members/close contacts diagnosed with the COVID 19 since your procedure?  no   If yes to any of these questions please route to Laverna Peace, RN and Karlton Lemon, RN

## 2020-01-17 NOTE — Telephone Encounter (Signed)
No answer, left message to call back later today, B.Cyrus Ramsburg RN. 

## 2020-01-17 NOTE — Telephone Encounter (Signed)
Optometrist referral pended.

## 2020-01-18 DIAGNOSIS — J301 Allergic rhinitis due to pollen: Secondary | ICD-10-CM | POA: Diagnosis not present

## 2020-01-19 ENCOUNTER — Encounter: Payer: Self-pay | Admitting: Gastroenterology

## 2020-01-23 ENCOUNTER — Other Ambulatory Visit: Payer: Self-pay | Admitting: Gastroenterology

## 2020-01-23 ENCOUNTER — Telehealth: Payer: Self-pay | Admitting: Gastroenterology

## 2020-01-23 DIAGNOSIS — R110 Nausea: Secondary | ICD-10-CM

## 2020-01-23 DIAGNOSIS — K219 Gastro-esophageal reflux disease without esophagitis: Secondary | ICD-10-CM

## 2020-01-23 DIAGNOSIS — R140 Abdominal distension (gaseous): Secondary | ICD-10-CM

## 2020-01-23 MED ORDER — PANTOPRAZOLE SODIUM 40 MG PO TBEC
40.0000 mg | DELAYED_RELEASE_TABLET | Freq: Every day | ORAL | 5 refills | Status: DC
Start: 2020-01-23 — End: 2020-02-01

## 2020-01-23 NOTE — Telephone Encounter (Signed)
Spoke to patient to inform her of Dr Frankey Shown recommendations. She has agreed to proceed with the Esophageal Manometry with 24 hr PH impedance. She knows to be off PPI for 5 days prior. She will try samples of Amitiza 8 mcg, if they work for her,then we will send over the prescription. She will come by the office to pick up samples. We will go over her Manometry instruction and low FODMAP diet when she picks up her samples in the next day or two. She will also try the probiotic Align. All questions answered patient voiced understanding

## 2020-01-23 NOTE — Telephone Encounter (Signed)
Spoke to patient who states she is still having stomach issues after her 01/13/20 EGD/colonoscopy. Patient reports feeling bloated and nauseated   after eating. She continues to have acid reflux during the day and has to sleep with her head elevated. Patient was taking Pantoprazole 40 mg Bid and with her last refill went to taking it daily. She does not want to be on medication long term for her symptom control. Dr Barron Alvine, do you have further recommendations to help control her symptoms better? Please sdvise

## 2020-01-23 NOTE — Telephone Encounter (Signed)
We can trial a few things, as she has both upper and lower GI issues:  GERD: -Since medications have been suboptimal in controlling her reflux symptoms, and as she does not want to be taking medications long-term, I think we need to do some further diagnostic evaluation with consideration for potential antireflux surgical options.  Please send for esophageal manometry and pH/impedance testing, to be done off PPI x5 days -Pending study results, can discuss either changing medications or antireflux surgical options as appropriate   Bloating/Nausea: -While this could be related to reflux, could also be related to her antireflux medications.  Trial course of probiotic and low FODMAP diet  Constipation: -If this is also an ongoing issue, this too can contribute to the bloating and nausea.  Did not tolerate previous trial of Linzess with Dr. Loreta Ave.  Plan for trial of Amitiza.  Start with low-dose 8 mcg bid, and can titrate up to 24 mcg bid dosing.  Start with 60-day supply with Rf1  Schedule follow-up in GI clinic, ideally after pH/impedance test has been completed so we can review results, but can be sooner if needed

## 2020-01-23 NOTE — Telephone Encounter (Signed)
Pt had an EGC and a Colonoscopy on 8/20. Pt states she is not feeling any better and would like a call back to advise on what she can do.

## 2020-01-23 NOTE — Telephone Encounter (Signed)
Spoke to patient all questions answered. Patient voiced understanding. 

## 2020-01-27 ENCOUNTER — Other Ambulatory Visit: Payer: BC Managed Care – PPO

## 2020-02-01 ENCOUNTER — Other Ambulatory Visit: Payer: Self-pay | Admitting: Gastroenterology

## 2020-02-01 MED ORDER — PANTOPRAZOLE SODIUM 40 MG PO TBEC: 40.0000 mg | DELAYED_RELEASE_TABLET | Freq: Two times a day (BID) | ORAL | 1 refills | Status: DC

## 2020-02-03 DIAGNOSIS — Z79890 Hormone replacement therapy (postmenopausal): Secondary | ICD-10-CM | POA: Diagnosis not present

## 2020-02-03 DIAGNOSIS — Z01419 Encounter for gynecological examination (general) (routine) without abnormal findings: Secondary | ICD-10-CM | POA: Diagnosis not present

## 2020-02-03 DIAGNOSIS — N952 Postmenopausal atrophic vaginitis: Secondary | ICD-10-CM | POA: Diagnosis not present

## 2020-02-03 DIAGNOSIS — Z90710 Acquired absence of both cervix and uterus: Secondary | ICD-10-CM | POA: Diagnosis not present

## 2020-02-03 DIAGNOSIS — N898 Other specified noninflammatory disorders of vagina: Secondary | ICD-10-CM | POA: Diagnosis not present

## 2020-02-03 DIAGNOSIS — R5383 Other fatigue: Secondary | ICD-10-CM | POA: Diagnosis not present

## 2020-02-17 ENCOUNTER — Other Ambulatory Visit: Payer: Self-pay

## 2020-02-17 ENCOUNTER — Ambulatory Visit (INDEPENDENT_AMBULATORY_CARE_PROVIDER_SITE_OTHER): Payer: BC Managed Care – PPO | Admitting: *Deleted

## 2020-02-17 DIAGNOSIS — B351 Tinea unguium: Secondary | ICD-10-CM

## 2020-02-17 NOTE — Progress Notes (Signed)
Patient presents today for the 2nd laser treatment. Diagnosed with mycotic nail infection by Dr. Charlsie Merles.    Toenail most affected hallux nails bilateral (L>R). She is concerned about the thickening of the 2nd nail left, but I do believe she has more of a dystrophy due to structural deformity of the toe.  The nails are already looking better. She is concerned about the ingrowns on both hallux nails, both borders. She is also having pain in her left foot, with cramping and concerns of still having pain after foot surgery. She was not satisfied with the response she got at her last office visit and seems frustrated.  All other systems are negative.  Nails were filed thin. Laser therapy was administered to 1st toenails bilateral and patient tolerated the treatment well. All safety precautions were in place.    Follow up in 4 weeks for laser # 3.  ~I offered her an appointment with Dr. Al Corpus to address her concerns. She is scheduled to see him next Thursday, Sept 30th at 4:15.

## 2020-02-23 ENCOUNTER — Ambulatory Visit (INDEPENDENT_AMBULATORY_CARE_PROVIDER_SITE_OTHER): Payer: BC Managed Care – PPO | Admitting: Podiatry

## 2020-02-23 ENCOUNTER — Other Ambulatory Visit: Payer: Self-pay

## 2020-02-23 ENCOUNTER — Ambulatory Visit: Payer: BC Managed Care – PPO

## 2020-02-23 DIAGNOSIS — M2041 Other hammer toe(s) (acquired), right foot: Secondary | ICD-10-CM

## 2020-02-23 DIAGNOSIS — L600 Ingrowing nail: Secondary | ICD-10-CM

## 2020-02-23 DIAGNOSIS — M2042 Other hammer toe(s) (acquired), left foot: Secondary | ICD-10-CM | POA: Diagnosis not present

## 2020-02-23 MED ORDER — NEOMYCIN-POLYMYXIN-HC 1 % OT SOLN: OTIC | 1 refills | Status: DC

## 2020-02-23 NOTE — Progress Notes (Signed)
She presents today for a chief complaint of painful ingrown toenails to the hallux bilaterally.  States that she still has some pain.  First metatarsophalangeal joint area after surgery 2 years ago.  Objective: Vital signs stable alert oriented x3.  Pulses are palpable.  Sharp incurvated nail margin on tibiofibular border of the hallux bilaterally.  There is no erythema edema cellulitis drainage or odor to his pain on palpation.  She also has reproducible pain on dorsiflexion and plantarflexion of the first metatarsophalangeal joint of the left foot as well as the first intermetatarsal space.  Hammertoe deformity fourth and fifth left to have previously been corrected appears to be recurring and his toes are highly sensitive to touch.  Contralateral foot right foot demonstrates a hammertoe deformity to the same toes which have not been worked on.  Radiographs taken today demonstrate K wire to the first metatarsal of the left foot and DIPJ arthroplasties fourth left and PIPJ arthroplasty fifth left.  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  Assessment: Primarily ingrown toenail tibiofibular border of the hallux bilateral.  Plan: Chemical matricectomy was performed today after local anesthetic was administered she tolerated procedure well without complications she was provided with both oral and written home-going structure for care and soaking of the toe as well as a prescription for Cortisporin Otic to be applied twice daily after soaking I will follow-up with her in 2 weeks

## 2020-02-23 NOTE — Patient Instructions (Signed)

## 2020-02-24 ENCOUNTER — Encounter: Payer: Self-pay | Admitting: Podiatry

## 2020-02-27 ENCOUNTER — Other Ambulatory Visit: Payer: BC Managed Care – PPO

## 2020-03-02 DIAGNOSIS — G43709 Chronic migraine without aura, not intractable, without status migrainosus: Secondary | ICD-10-CM | POA: Diagnosis not present

## 2020-03-06 DIAGNOSIS — H538 Other visual disturbances: Secondary | ICD-10-CM | POA: Diagnosis not present

## 2020-03-06 DIAGNOSIS — H04123 Dry eye syndrome of bilateral lacrimal glands: Secondary | ICD-10-CM | POA: Diagnosis not present

## 2020-03-08 ENCOUNTER — Other Ambulatory Visit: Payer: Self-pay

## 2020-03-08 ENCOUNTER — Encounter: Payer: Self-pay | Admitting: Podiatry

## 2020-03-08 ENCOUNTER — Ambulatory Visit (INDEPENDENT_AMBULATORY_CARE_PROVIDER_SITE_OTHER): Payer: BC Managed Care – PPO | Admitting: Podiatry

## 2020-03-08 ENCOUNTER — Ambulatory Visit: Payer: BC Managed Care – PPO | Admitting: Podiatry

## 2020-03-08 DIAGNOSIS — L600 Ingrowing nail: Secondary | ICD-10-CM

## 2020-03-08 DIAGNOSIS — Z9889 Other specified postprocedural states: Secondary | ICD-10-CM

## 2020-03-08 NOTE — Progress Notes (Signed)
She presents today for follow-up nail procedure hallux bilaterally.  States that she is doing fine.  Objective: Vital signs are stable she alert oriented x3 at this point there is no erythema edema cellulitis drainage or odor tibial border the hallux right appears to be a little slower than that of the left.  Plan: I encouraged her to continue to soak Epson salts and warm water for the next 2 weeks until completely well.  Follow-up with me if this fails to improve

## 2020-03-16 ENCOUNTER — Ambulatory Visit (INDEPENDENT_AMBULATORY_CARE_PROVIDER_SITE_OTHER): Payer: BC Managed Care – PPO | Admitting: *Deleted

## 2020-03-16 ENCOUNTER — Other Ambulatory Visit: Payer: Self-pay

## 2020-03-16 DIAGNOSIS — B351 Tinea unguium: Secondary | ICD-10-CM

## 2020-03-16 NOTE — Progress Notes (Signed)
Patient presents today for the 3rd laser treatment. Diagnosed with mycotic nail infection by Dr. Charlsie Merles.    Toenail most affected hallux nails bilateral (L>R). She is concerned about the thickening of the 2nd nail left, but I do believe she has more of a dystrophy due to structural deformity of the toe.  The nails are looking much better already. And the toes are well healed from the ingrown procedure as well.  All other systems are negative.  Nails were filed thin. Laser therapy was administered to 1st toenails bilateral and patient tolerated the treatment well. All safety precautions were in place.    Follow up in 6 weeks for laser # 4.

## 2020-04-02 NOTE — Progress Notes (Signed)
Called patient regarding Christine Hendrix test for Friday 04/06/2020. I went through and explained test to the patient. She stated she did not realize what was involved in the study and stated she wished to cancel the test for now. She stated she would call Dr. Frankey Shown office and reschedule .

## 2020-04-04 ENCOUNTER — Other Ambulatory Visit (HOSPITAL_COMMUNITY): Payer: BC Managed Care – PPO

## 2020-04-06 ENCOUNTER — Ambulatory Visit (HOSPITAL_COMMUNITY): Admit: 2020-04-06 | Payer: BC Managed Care – PPO | Admitting: Gastroenterology

## 2020-04-06 ENCOUNTER — Encounter (HOSPITAL_COMMUNITY): Payer: Self-pay

## 2020-04-06 SURGERY — MANOMETRY, ESOPHAGUS

## 2020-04-23 ENCOUNTER — Other Ambulatory Visit: Payer: BC Managed Care – PPO

## 2020-04-23 DIAGNOSIS — H43812 Vitreous degeneration, left eye: Secondary | ICD-10-CM | POA: Diagnosis not present

## 2020-04-27 DIAGNOSIS — H6981 Other specified disorders of Eustachian tube, right ear: Secondary | ICD-10-CM | POA: Diagnosis not present

## 2020-04-27 DIAGNOSIS — G43009 Migraine without aura, not intractable, without status migrainosus: Secondary | ICD-10-CM | POA: Diagnosis not present

## 2020-04-27 DIAGNOSIS — R053 Chronic cough: Secondary | ICD-10-CM | POA: Diagnosis not present

## 2020-04-27 DIAGNOSIS — R0982 Postnasal drip: Secondary | ICD-10-CM | POA: Diagnosis not present

## 2020-05-04 DIAGNOSIS — H938X3 Other specified disorders of ear, bilateral: Secondary | ICD-10-CM | POA: Diagnosis not present

## 2020-05-04 DIAGNOSIS — R040 Epistaxis: Secondary | ICD-10-CM | POA: Diagnosis not present

## 2020-05-04 DIAGNOSIS — Z7901 Long term (current) use of anticoagulants: Secondary | ICD-10-CM | POA: Diagnosis not present

## 2020-05-11 ENCOUNTER — Other Ambulatory Visit: Payer: BC Managed Care – PPO

## 2020-05-14 ENCOUNTER — Encounter: Payer: Self-pay | Admitting: Podiatry

## 2020-06-08 ENCOUNTER — Other Ambulatory Visit: Payer: Self-pay

## 2020-06-08 ENCOUNTER — Ambulatory Visit (INDEPENDENT_AMBULATORY_CARE_PROVIDER_SITE_OTHER): Payer: BC Managed Care – PPO

## 2020-06-08 DIAGNOSIS — B351 Tinea unguium: Secondary | ICD-10-CM

## 2020-06-08 NOTE — Progress Notes (Signed)
Patient presents today for the 4th laser treatment. Diagnosed with mycotic nail infection by Dr. Charlsie Merles.    Toenail most affected hallux nails bilateral (L>R). She is concerned about the thickening of the 2nd nail left, but I do believe she has more of a dystrophy due to structural deformity of the toe.  The nails are looking much better already. And the toes are well healed from the ingrown procedure as well.  All other systems are negative.  Nails were filed thin. Laser therapy was administered to 1st toenails bilateral and patient tolerated the treatment well. All safety precautions were in place.    Follow up in 6 weeks for laser # 5.

## 2020-06-10 IMAGING — DX DG CHEST 1V PORT
1 series · 1 of 1 positions shown · non-contrast
Comparison: None.

CLINICAL DATA: Shortness of breath

EXAM:
PORTABLE CHEST 1 VIEW

[chest ap]
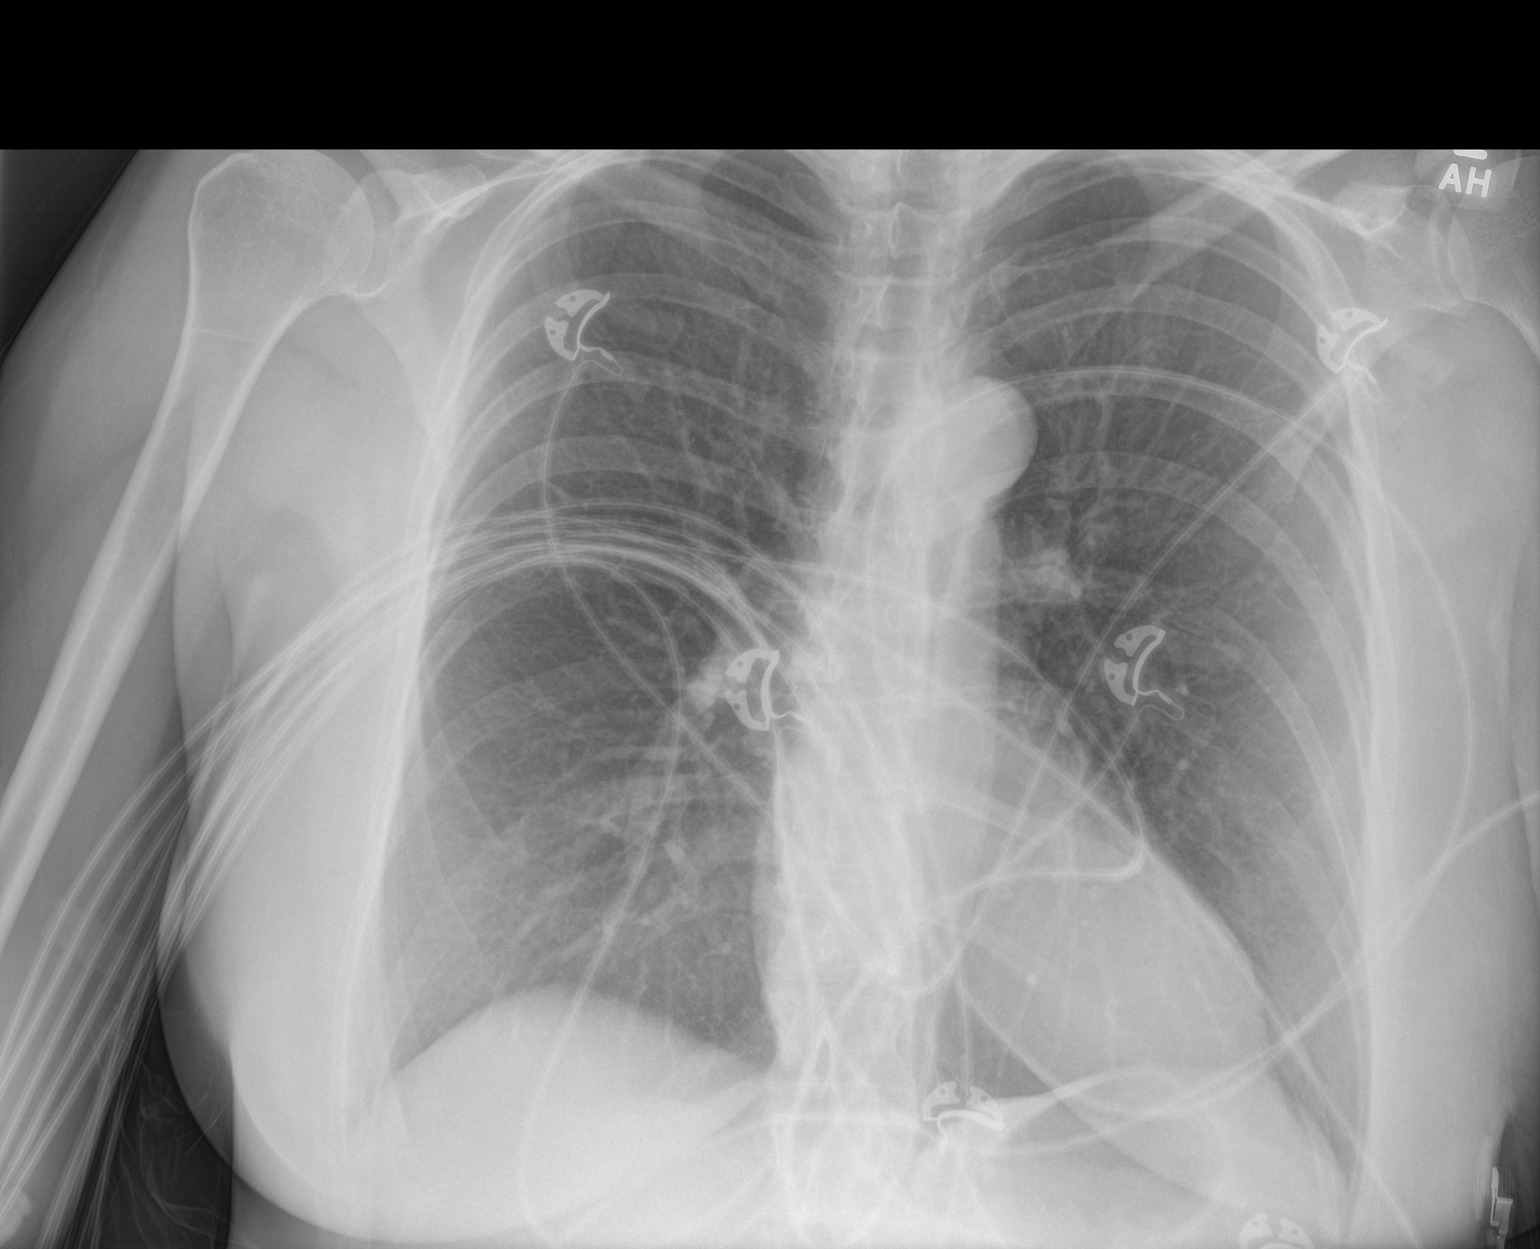

[1 of 1 positions shown; findings below may reference images not displayed]

FINDINGS: The heart size and mediastinal contours are within normal limits.
Both lungs are clear. The visualized skeletal structures are
unremarkable.
IMPRESSION: No active disease.

## 2020-06-14 ENCOUNTER — Encounter: Payer: Self-pay | Admitting: Podiatry

## 2020-06-14 ENCOUNTER — Ambulatory Visit (INDEPENDENT_AMBULATORY_CARE_PROVIDER_SITE_OTHER): Payer: BC Managed Care – PPO | Admitting: Podiatry

## 2020-06-14 ENCOUNTER — Other Ambulatory Visit: Payer: Self-pay

## 2020-06-14 DIAGNOSIS — L03032 Cellulitis of left toe: Secondary | ICD-10-CM

## 2020-06-14 NOTE — Progress Notes (Signed)
She presents today concerned about a area of redness on her fibular border of the hallux left.  She had a previous matrixectomy done back in October and has this is been growing out she has noticed the redness is moving along the edge.  She states that she is becoming uncomfortable as it continues to grow out she dates that she feels silly about coming but has started to increase in his pain.  Objective: Vital signs are stable alert oriented x3 pulses remain palpable.  There is a small little subungual hematoma from where the matrixectomy was performed that has grown out about halfway the length of the nail.  Also there is a small area of inflammation right side of that.  Most likely this is at the spicule within the margin that is pulling through the skin as it grows out.  Assessment: Paronychia fibular border hallux left.  Plan: Offered her local anesthetic she declined so I am going ahead and cleaned out the lateral margin of the nail and it appears to be a small spicule that was present causing her pain.  I did encourage her to start soaking Epson salts and warm water at least once daily and put small amount of Neosporin on it with a Band-Aid just during the daytime.  Otherwise I will follow-up with her on an as-needed basis.

## 2020-06-19 DIAGNOSIS — H2513 Age-related nuclear cataract, bilateral: Secondary | ICD-10-CM | POA: Diagnosis not present

## 2020-06-19 DIAGNOSIS — H43393 Other vitreous opacities, bilateral: Secondary | ICD-10-CM | POA: Diagnosis not present

## 2020-06-19 DIAGNOSIS — H18523 Epithelial (juvenile) corneal dystrophy, bilateral: Secondary | ICD-10-CM | POA: Diagnosis not present

## 2020-06-25 DIAGNOSIS — R0600 Dyspnea, unspecified: Secondary | ICD-10-CM | POA: Diagnosis not present

## 2020-06-25 DIAGNOSIS — I341 Nonrheumatic mitral (valve) prolapse: Secondary | ICD-10-CM | POA: Diagnosis not present

## 2020-06-25 DIAGNOSIS — I4891 Unspecified atrial fibrillation: Secondary | ICD-10-CM | POA: Diagnosis not present

## 2020-07-09 DIAGNOSIS — J301 Allergic rhinitis due to pollen: Secondary | ICD-10-CM | POA: Diagnosis not present

## 2020-07-09 DIAGNOSIS — G43009 Migraine without aura, not intractable, without status migrainosus: Secondary | ICD-10-CM | POA: Diagnosis not present

## 2020-07-20 ENCOUNTER — Other Ambulatory Visit: Payer: BC Managed Care – PPO

## 2020-08-01 DIAGNOSIS — N6002 Solitary cyst of left breast: Secondary | ICD-10-CM | POA: Diagnosis not present

## 2020-08-01 DIAGNOSIS — R922 Inconclusive mammogram: Secondary | ICD-10-CM | POA: Diagnosis not present

## 2020-08-01 DIAGNOSIS — R928 Other abnormal and inconclusive findings on diagnostic imaging of breast: Secondary | ICD-10-CM | POA: Diagnosis not present

## 2020-08-01 LAB — HM MAMMOGRAPHY

## 2020-08-15 DIAGNOSIS — I059 Rheumatic mitral valve disease, unspecified: Secondary | ICD-10-CM | POA: Diagnosis not present

## 2020-08-17 ENCOUNTER — Other Ambulatory Visit: Payer: BC Managed Care – PPO

## 2020-08-27 ENCOUNTER — Other Ambulatory Visit: Payer: Self-pay | Admitting: Family Medicine

## 2020-08-27 DIAGNOSIS — N39498 Other specified urinary incontinence: Secondary | ICD-10-CM | POA: Diagnosis not present

## 2020-08-27 DIAGNOSIS — R351 Nocturia: Secondary | ICD-10-CM | POA: Diagnosis not present

## 2020-08-27 DIAGNOSIS — R300 Dysuria: Secondary | ICD-10-CM | POA: Diagnosis not present

## 2020-08-31 DIAGNOSIS — G43709 Chronic migraine without aura, not intractable, without status migrainosus: Secondary | ICD-10-CM | POA: Diagnosis not present

## 2020-08-31 DIAGNOSIS — H2513 Age-related nuclear cataract, bilateral: Secondary | ICD-10-CM | POA: Diagnosis not present

## 2020-08-31 DIAGNOSIS — H18523 Epithelial (juvenile) corneal dystrophy, bilateral: Secondary | ICD-10-CM | POA: Diagnosis not present

## 2020-08-31 DIAGNOSIS — G4700 Insomnia, unspecified: Secondary | ICD-10-CM | POA: Diagnosis not present

## 2020-09-16 ENCOUNTER — Encounter: Payer: Self-pay | Admitting: Family Medicine

## 2020-09-17 MED ORDER — LEVOTHYROXINE SODIUM 50 MCG PO TABS: 50.0000 ug | ORAL_TABLET | Freq: Every day | ORAL | 0 refills | Status: DC

## 2020-09-28 ENCOUNTER — Ambulatory Visit: Payer: BC Managed Care – PPO | Admitting: Family Medicine

## 2020-10-05 DIAGNOSIS — H2513 Age-related nuclear cataract, bilateral: Secondary | ICD-10-CM | POA: Diagnosis not present

## 2020-10-05 DIAGNOSIS — H18523 Epithelial (juvenile) corneal dystrophy, bilateral: Secondary | ICD-10-CM | POA: Diagnosis not present

## 2020-10-05 DIAGNOSIS — H43393 Other vitreous opacities, bilateral: Secondary | ICD-10-CM | POA: Diagnosis not present

## 2020-10-19 ENCOUNTER — Encounter: Payer: Self-pay | Admitting: Family Medicine

## 2020-10-19 ENCOUNTER — Other Ambulatory Visit: Payer: Self-pay

## 2020-10-19 ENCOUNTER — Ambulatory Visit (INDEPENDENT_AMBULATORY_CARE_PROVIDER_SITE_OTHER): Payer: BC Managed Care – PPO | Admitting: Family Medicine

## 2020-10-19 VITALS — BP 134/81 | HR 72 | Temp 97.3°F | Wt 147.5 lb

## 2020-10-19 DIAGNOSIS — R7989 Other specified abnormal findings of blood chemistry: Secondary | ICD-10-CM | POA: Diagnosis not present

## 2020-10-19 DIAGNOSIS — I480 Paroxysmal atrial fibrillation: Secondary | ICD-10-CM

## 2020-10-19 DIAGNOSIS — E039 Hypothyroidism, unspecified: Secondary | ICD-10-CM | POA: Diagnosis not present

## 2020-10-19 DIAGNOSIS — R5383 Other fatigue: Secondary | ICD-10-CM

## 2020-10-19 DIAGNOSIS — E278 Other specified disorders of adrenal gland: Secondary | ICD-10-CM

## 2020-10-19 DIAGNOSIS — R7309 Other abnormal glucose: Secondary | ICD-10-CM

## 2020-10-19 DIAGNOSIS — F5101 Primary insomnia: Secondary | ICD-10-CM

## 2020-10-19 MED ORDER — TRAZODONE HCL 50 MG PO TABS: 50.0000 mg | ORAL_TABLET | Freq: Every evening | ORAL | 1 refills | Status: DC | PRN

## 2020-10-19 NOTE — Patient Instructions (Addendum)
Great to see you today! Let's check labs to be sure there isn't anything going on here.  Let's try trazodone for sleep.  Start with 50mg .  You can increase to 100mg  if needed.   We may need to consider doing another heart monitor to evaluate A. Fib burden if fluttering/shortness of breath episodes continue.

## 2020-10-20 LAB — BASIC METABOLIC PANEL
BUN: 16 mg/dL (ref 7–25)
CO2: 27 mmol/L (ref 20–32)
Calcium: 9 mg/dL (ref 8.6–10.4)
Chloride: 100 mmol/L (ref 98–110)
Creat: 0.82 mg/dL (ref 0.50–1.05)
Glucose, Bld: 86 mg/dL (ref 65–99)
Potassium: 3.7 mmol/L (ref 3.5–5.3)
Sodium: 142 mmol/L (ref 135–146)

## 2020-10-20 LAB — HEMOGLOBIN A1C
Hgb A1c MFr Bld: 5.6 % of total Hgb (ref <5.7)
Mean Plasma Glucose: 114 mg/dL
eAG (mmol/L): 6.3 mmol/L

## 2020-10-20 LAB — TSH+FREE T4: TSH W/REFLEX TO FT4: 5.9 mIU/L — ABNORMAL HIGH

## 2020-10-20 LAB — VITAMIN B12: Vitamin B-12: >2000 pg/mL — ABNORMAL HIGH (ref 200–1100)

## 2020-10-20 LAB — CORTISOL: Cortisol, Plasma: 15.6 ug/dL

## 2020-10-20 LAB — T4, FREE: Free T4: 1.5 ng/dL (ref 0.8–1.8)

## 2020-10-22 NOTE — Progress Notes (Signed)
Christine Hendrix - 55 y.o. female MRN [id number]  Date of birth: [birth year]  Subjective Chief Complaint  Patient presents with  . Thyroid Problem    HPI Christine Hendrix is a 55 y.o. female here today for follow up visit.  She is concerned about weight gain and fatigue.  She has history of hypothyroidism, PAF, and surgical menopause.  She is taking levothyroxine daily each morning on empty stomach..  She is concerned that dose may need to be adjusted.  She has been gaining weight but denies any changes to diet or activity. She also feels more fatigued  She has history of a. Fib.  She is taking diltiazem.  She is followed by Dr. Chales Abrahams with cardiology in [facility].  Did sleep study last year without significant OSA noted.  She had event recorder last year as well that did not demonstrate a. Fib.  She does get palpitations from time to time.  Doesn't feel overly anxious but has had difficulty with sleep.  She denies shortness of breath or chest pain with palpitations.   ROS:  A comprehensive ROS was completed and negative except as noted per HPI  Allergies  Allergen Reactions  . Codeine Nausea And Vomiting, Other (See Comments) and Rash    Vomiting and dizziness  . Solifenacin Other (See Comments)    BLURRED VISION Other reaction(s): Hallucinations Visual disturbances  . Molds & Smuts Other (See Comments)    Past Medical History:  Diagnosis Date  . Arthritis    neck  . Atrial fibrillation (HCC)   . Colitis    chronic   . Colitis   . Dental crowns present    also dental implant lower right  . Family history of colon cancer   . Hyperlipidemia   . Hypothyroidism   . Kidney stone on left side 03/27/2017  . Migraines   . MVP (mitral valve prolapse)   . Nasal turbinate hypertrophy 04/2016  . Nephrolithiasis   . PONV (postoperative nausea and vomiting)   . Psoriasis   . TMJ syndrome    limited jaw opening    Past Surgical History:  Procedure Laterality Date  . ABDOMINAL  HYSTERECTOMY     complete  . BUNIONECTOMY WITH HAMMERTOE RECONSTRUCTION Left 03/04/2016   4th/5th toes  . CHOLECYSTECTOMY    . COLONOSCOPY     More than 5 years. [facility]. [place]   . ESOPHAGOGASTRODUODENOSCOPY     about 5 years ago. [facility] [employer]  . TMJ ARTHROPLASTY    . TRIGEMINAL NERVE DECOMPRESSION    . TURBINATE REDUCTION Bilateral 05/23/2016   Procedure: BILATERAL TURBINATE REDUCTION;  Surgeon: Newman Pies, MD;  Location: [facility];  Service: ENT;  Laterality: Bilateral;    Social History   Socioeconomic History  . Marital status: Married    Spouse name: Not on file  . Number of children: Not on file  . Years of education: Not on file  . Highest education level: Not on file  Occupational History  . 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 Smoking status: Never Smoker  . Smokeless tobacco: Never Used  Vaping Use  . Vaping Use: Never used  Substance and Sexual Activity  . Alcohol use: No    Alcohol/week: 0.0 standard drinks  . Drug use: No  . Sexual activity: Not on file  Other Topics Concern  . Not on file  Social History Narrative  .  Not on file   Social Determinants of Health   Financial Resource Strain: Not on file  Food Insecurity: Not on file  Transportation Needs: Not on file  Physical Activity: Not on file  Stress: Not on file  Social Connections: Not on file    Family History  Problem Relation Age of Onset  . Heart disease Father   . Heart failure Father   . Colon cancer Father        dx early 19's   . Stomach cancer Brother   . Colon cancer Brother   . Liver cancer Brother   . Heart disease Mother   . CAD Mother   . Esophageal cancer Neg Hx     Health Maintenance  Topic Date Due  . COVID-19 Vaccine (1) Never done  . HIV Screening  Never done  . Hepatitis C Screening  Never done  . Zoster Vaccines- Shingrix (1 of 2) Never done  . TETANUS/TDAP   05/26/2020  . MAMMOGRAM  07/02/2020  . INFLUENZA VACCINE  12/24/2020  . COLONOSCOPY (Pts 45-37yrs Insurance coverage will need to be confirmed)  01/12/2025  . HPV VACCINES  Aged Out  . PAP SMEAR-Modifier  Discontinued     ----------------------------------------------------------------------------------------------------------------------------------------------------------------------------------------------------------------- Physical Exam BP 134/81 (BP Location: Left Arm, Patient Position: Sitting, Cuff Size: Normal)   Pulse 72   Temp (!) 97.3 F (36.3 C)   Wt 147 lb 8 oz (66.9 kg)   SpO2 100%   BMI 25.32 kg/m   Physical Exam Constitutional:      Appearance: Normal appearance.  Eyes:     General: No scleral icterus. Cardiovascular:     Rate and Rhythm: Normal rate and regular rhythm.  Pulmonary:     Effort: Pulmonary effort is normal.     Breath sounds: Normal breath sounds.  Musculoskeletal:     Cervical back: Neck supple.  Neurological:     General: No focal deficit present.     Mental Status: She is alert.  Psychiatric:        Mood and Affect: Mood normal.        Behavior: Behavior normal.     ------------------------------------------------------------------------------------------------------------------------------------------------------------------------------------------------------------------- Assessment and Plan  PAF (paroxysmal atrial fibrillation) (HCC) She has some increased fatigue and palpitations.  We discussed possibly repeating event monitor to be sure a. Fib burden is not contributing.  She will continue to see her cardiologist and stay on diltiazem.   Hypothyroidism Increased fatigue and weight gain.  Checking thyroid function tests.   Adrenal mass, left (HCC) History of adrenal nodule.  Checking cortisol with weight gain.   Insomnia We discussed trial of trazodone nightly.  Return in about 2 months (around 12/19/2020) for  Insomnia/fatigue.]   Meds ordered this encounter  Medications  . traZODone (DESYREL) 50 MG tablet    Sig: Take 1-2 tablets (50-100 mg total) by mouth at bedtime as needed for sleep.    Dispense:  90 tablet    Refill:  1    Return in about 2 months (around 12/19/2020) for Insomnia/fatigue.    This visit occurred during the SARS-CoV-2 public health emergency.  Safety protocols were in place, including screening questions prior to the visit, additional usage of staff PPE, and extensive cleaning of exam room while observing appropriate contact time as indicated for disinfecting solutions.

## 2020-10-22 NOTE — Assessment & Plan Note (Signed)
Increased fatigue and weight gain.  Checking thyroid function tests.

## 2020-10-22 NOTE — Assessment & Plan Note (Signed)
She has some increased fatigue and palpitations.  We discussed possibly repeating event monitor to be sure a. Fib burden is not contributing.  She will continue to see her cardiologist and stay on diltiazem.

## 2020-10-22 NOTE — Assessment & Plan Note (Signed)
History of adrenal nodule.  Checking cortisol with weight gain.

## 2020-10-22 NOTE — Assessment & Plan Note (Signed)
We discussed trial of trazodone nightly.  Return in about 2 months (around 12/19/2020) for Insomnia/fatigue.]

## 2020-10-23 ENCOUNTER — Other Ambulatory Visit: Payer: Self-pay | Admitting: Family Medicine

## 2020-10-23 MED ORDER — LEVOTHYROXINE SODIUM 75 MCG PO TABS: 75.0000 ug | ORAL_TABLET | Freq: Every day | ORAL | 1 refills | Status: DC

## 2020-12-10 ENCOUNTER — Encounter: Payer: Self-pay | Admitting: Family Medicine

## 2020-12-11 ENCOUNTER — Encounter: Payer: Self-pay | Admitting: Family Medicine

## 2020-12-11 MED ORDER — AIMOVIG 140 MG/ML ~~LOC~~ SOAJ: 140.0000 mg | SUBCUTANEOUS | 3 refills | Status: DC

## 2020-12-11 MED ORDER — AMITRIPTYLINE HCL 10 MG PO TABS: 20.0000 mg | ORAL_TABLET | Freq: Every day | ORAL | 1 refills | Status: DC

## 2020-12-11 MED ORDER — SUMATRIPTAN SUCCINATE 100 MG PO TABS: 100.0000 mg | ORAL_TABLET | ORAL | 3 refills | Status: DC | PRN

## 2020-12-11 MED ORDER — IBUPROFEN 800 MG PO TABS: 800.0000 mg | ORAL_TABLET | Freq: Three times a day (TID) | ORAL | 2 refills | Status: DC | PRN

## 2020-12-11 MED ORDER — ZOLMITRIPTAN 5 MG NA SOLN: 1.0000 | NASAL | 3 refills | Status: DC | PRN

## 2020-12-11 NOTE — Telephone Encounter (Signed)
Refills completed

## 2020-12-21 ENCOUNTER — Ambulatory Visit: Payer: BC Managed Care – PPO | Admitting: Family Medicine

## 2021-01-03 ENCOUNTER — Other Ambulatory Visit: Payer: Self-pay | Admitting: Family Medicine

## 2021-01-03 ENCOUNTER — Encounter: Payer: Self-pay | Admitting: Family Medicine

## 2021-01-04 ENCOUNTER — Other Ambulatory Visit: Payer: Self-pay | Admitting: Family Medicine

## 2021-01-04 MED ORDER — ONDANSETRON 8 MG PO TBDP: 8.0000 mg | ORAL_TABLET | Freq: Three times a day (TID) | ORAL | 1 refills | Status: DC | PRN

## 2021-01-18 ENCOUNTER — Ambulatory Visit: Payer: BC Managed Care – PPO | Admitting: Family Medicine

## 2021-02-06 DIAGNOSIS — H2513 Age-related nuclear cataract, bilateral: Secondary | ICD-10-CM | POA: Diagnosis not present

## 2021-02-06 DIAGNOSIS — H18523 Epithelial (juvenile) corneal dystrophy, bilateral: Secondary | ICD-10-CM | POA: Diagnosis not present

## 2021-02-06 DIAGNOSIS — H43393 Other vitreous opacities, bilateral: Secondary | ICD-10-CM | POA: Diagnosis not present

## 2021-02-11 ENCOUNTER — Encounter: Payer: Self-pay | Admitting: Family Medicine

## 2021-02-15 ENCOUNTER — Encounter: Payer: Self-pay | Admitting: Family Medicine

## 2021-02-18 MED ORDER — ESTROGENS CONJUGATED 1.25 MG PO TABS: 1.2500 mg | ORAL_TABLET | Freq: Every day | ORAL | 1 refills | Status: DC

## 2021-02-18 NOTE — Telephone Encounter (Signed)
We have not prescribed this medication for the patient previously.  Please review and refill if appropriate.  Pt was due for f/u 11/2020 and has not so.  Tiajuana Amass, CMA

## 2021-02-19 ENCOUNTER — Other Ambulatory Visit: Payer: Self-pay | Admitting: Family Medicine

## 2021-02-19 ENCOUNTER — Encounter: Payer: Self-pay | Admitting: Family Medicine

## 2021-02-19 ENCOUNTER — Encounter: Payer: Self-pay | Admitting: Gastroenterology

## 2021-02-19 ENCOUNTER — Ambulatory Visit (INDEPENDENT_AMBULATORY_CARE_PROVIDER_SITE_OTHER): Payer: BC Managed Care – PPO | Admitting: Gastroenterology

## 2021-02-19 ENCOUNTER — Other Ambulatory Visit: Payer: Self-pay

## 2021-02-19 ENCOUNTER — Other Ambulatory Visit: Payer: Self-pay | Admitting: Gastroenterology

## 2021-02-19 VITALS — BP 140/88 | HR 85 | Ht 64.0 in | Wt 156.0 lb

## 2021-02-19 DIAGNOSIS — K5900 Constipation, unspecified: Secondary | ICD-10-CM | POA: Diagnosis not present

## 2021-02-19 DIAGNOSIS — K219 Gastro-esophageal reflux disease without esophagitis: Secondary | ICD-10-CM

## 2021-02-19 DIAGNOSIS — R1310 Dysphagia, unspecified: Secondary | ICD-10-CM | POA: Diagnosis not present

## 2021-02-19 DIAGNOSIS — Z800 Family history of malignant neoplasm of digestive organs: Secondary | ICD-10-CM

## 2021-02-19 MED ORDER — FAMOTIDINE 20 MG PO TABS: 20.0000 mg | ORAL_TABLET | Freq: Every day | ORAL | 3 refills | Status: DC

## 2021-02-19 NOTE — Progress Notes (Signed)
Chief Complaint:    GERD, epigastric pain, dysphagia  GI History: 55 y.o. female with history of adrenal adenoma, atrial fibrillation, MVP, hypothyroidism  Longstanding history of GERD.  Index symptoms of heartburn, regurgitation, increased throat clearing.  Occasional dysphagia. Has trialed multiple meds over the years (Zantac, omeprazole, Zofran, promethazine, Pepto, Mylanta).  History of chronic constipation described as pellet-like stools with straining. Previously trialed Linzess with Dr. Loreta Ave. Had diarrhea after taking for a few days, then stopped and retrialed again a few weeks later with recurrence of diarrhea, so she stopped (took every other day). Improved now.   Family history notable for father with colon cancer, diagnosed in his 48s.  Brother died in 09-29-18 with presumed progressive metastatic colon cancer.  Endoscopic History: - Reports multiple colonoscopies since her 66s when she was diagnosed with "colitis".  None available in EMR for review. - EGD (12/2019): Normal esophagus, empiric Elease Hashimoto 60 French with mucosal rent at 18 cm consistent with subtle stricture.  Hill grade 3, normal Z-line.  Mild non-H. pylori gastritis.  Normal duodenum with normal biopsies - Colonoscopy (12/2019): Internal hemorrhoids, otherwise normal.  Repeat in 5 years  HPI:     Patient is a 55 y.o. female presenting to the Gastroenterology Clinic for follow-up.  Initially seen in the GI clinic on 10/27/2019 for evaluation of reflux, abdominal pain, hematochezia, change in bowel habits.  EGD/colonoscopy completed 12/2019 as outlined above.  No follow-up since then.  Did have improvement with dysphagia with EGD with dilation in 12/2019, but has started to recur in the last couple months, pointing to suprasternal notch.  No food impactions.  Did have improvement in reflux sxs and weaned off PPI, but started having regurgitation again. Started Pepto without change. Restarted Protonix 40 mg daily then increase  to BID. Rare nocturnal sxs now. Regurgitation worse with forward flexion. Also with having worsening epigastric pain and nausea over the last couple of months.  Pain worse after eating, independent of food types. Avoids eating close to bedtime. Sleeps with HOB elevated.   Constipation improved with fiber supplement.   Review of systems:     No chest pain, no SOB, no fevers, no urinary sx   Past Medical History:  Diagnosis Date   Arthritis    neck   Atrial fibrillation (HCC)    Colitis    chronic    Colitis    Dental crowns present    also dental implant lower right   Family history of colon cancer    Hyperlipidemia    Hypothyroidism    Kidney stone on left side 03/27/2017   Migraines    MVP (mitral valve prolapse)    Nasal turbinate hypertrophy 04/2016   Nephrolithiasis    PONV (postoperative nausea and vomiting)    Psoriasis    TMJ syndrome    limited jaw opening    Patient's surgical history, family medical history, social history, medications and allergies were all reviewed in Epic    Current Outpatient Medications  Medication Sig Dispense Refill   amitriptyline (ELAVIL) 10 MG tablet Take 2 tablets (20 mg total) by mouth at bedtime. 180 tablet 1   aspirin EC 81 MG tablet Take 1 tablet (81 mg total) by mouth daily. 90 tablet 3   Erenumab-aooe (AIMOVIG) 140 MG/ML SOAJ Inject 140 mg into the skin every 30 (thirty) days. 1.12 mL 3   estrogens, conjugated, (PREMARIN) 1.25 MG tablet Take 1 tablet (1.25 mg total) by mouth daily. 90 tablet 1  ibuprofen (ADVIL) 800 MG tablet Take 1 tablet (800 mg total) by mouth every 8 (eight) hours as needed. 90 tablet 2   levothyroxine (SYNTHROID) 75 MCG tablet Take 1 tablet (75 mcg total) by mouth daily before breakfast. 90 tablet 1   loteprednol (LOTEMAX) 0.5 % ophthalmic suspension SMARTSIG:In Eye(s)     ondansetron (ZOFRAN-ODT) 8 MG disintegrating tablet Take 1 tablet (8 mg total) by mouth every 8 (eight) hours as needed for nausea or  vomiting. 180 tablet 1   pantoprazole (PROTONIX) 40 MG tablet Take 1 tablet (40 mg total) by mouth 2 (two) times daily. 90 tablet 1   diltiazem (CARDIZEM CD) 120 MG 24 hr capsule Take 1 capsule (120 mg total) by mouth daily. 90 capsule 3   No current facility-administered medications for this visit.    Physical Exam:     BP 140/88   Pulse 85   Ht 5\' 4"  (1.626 m)   Wt 156 lb (70.8 kg)   SpO2 98%   BMI 26.78 kg/m   GENERAL:  Pleasant female in NAD PSYCH: : Cooperative, normal affect EENT:  conjunctiva pink, mucous membranes moist, neck supple without masses CARDIAC:  RRR, no murmur heard, no peripheral edema PULM: Normal respiratory effort, lungs CTA bilaterally, no wheezing ABDOMEN: Mild TTP in MEG.  No rebound or guarding.  Nondistended, soft, nontender. No obvious masses, no hepatomegaly,  normal bowel sounds SKIN:  turgor, no lesions seen Musculoskeletal:  Normal muscle tone, normal strength NEURO: Alert and oriented x 3, no focal neurologic deficits   IMPRESSION and PLAN:    1) GERD 2) Regurgitation Long discussion today regarding pathophysiology of reflux.  Prior EGD with Hill grade 3 valve.  Suspect significant LES laxity leading to uncontrolled regurgitation despite high-dose PPI.  Discussed treatment options at length today, to include escalating medical management, antireflux surgical options.  Discussed further diagnostic options and ultimately plan as below:  - Esophageal Manometry and pH/impedance testing to be done off PPI x7 days - Given expected prolonged wait time for manometry lab, starting Pepcid 20 mg qhs in the interim - Continue antireflux lifestyle/dietary modifications - Discussed antireflux surgical options today, to include laparoscopic hernia/crural repair, TIF  3) Dysphagia - Offered repeat EGD with esophageal dilation as she previously had clinical improvement after dilation of subtle proximal esophageal stricture.  She decided to hold off in favor  of the above - Esophageal Manometry as above - If symptoms worsen or patient reconsiders, can offer EGD with empiric dilation again  4) Constipation - Improved since starting fiber supplement  5) Family history of colon cancer - Repeat colonoscopy in 2026  I spent 35 minutes of time, including in depth chart review, independent review of results as outlined above, communicating results with the patient directly, face-to-face time with the patient, coordinating care, and ordering studies and medications as appropriate, and documentation.           2027 ,DO, FACG 02/19/2021, 4:25 PM

## 2021-02-19 NOTE — Patient Instructions (Addendum)
If you are age 56 or older, your body mass index should be between 23-30. Your Body mass index is 26.78 kg/m. If this is out of the aforementioned range listed, please consider follow up with your Primary Care Provider.  If you are age 63 or younger, your body mass index should be between 19-25. Your Body mass index is 26.78 kg/m. If this is out of the aformentioned range listed, please consider follow up with your Primary Care Provider.   __________________________________________________________  The [facility] providers would like to encourage you to use [facility] to communicate with providers for non-urgent requests or questions.  Due to long hold times on the telephone, sending your provider a message by [facility] may be a faster and more efficient way to get a response.  Please allow 48 business hours for a response.  Please remember that this is for non-urgent requests.  _________________________________________________________ I will contact you with a date and time for your manometry. The schedulers were gone for the day.  You have been scheduled for an esophageal manometry at [facility] on _____________ at ___________. Please arrive 30 minutes prior to your procedure for registration. You will need to go to outpatient registration (1st floor of the hospital) first. Make certain to bring your insurance cards as well as a complete list of medications.  Please remember the following:  1) Do not take any muscle relaxants, xanax (alprazolam) or ativan for 1 day prior to your test as well as the day of the test.  2) Nothing to eat or drink for 4 hours before your test.  3) Hold all diabetic medications/insulin the morning of the test. You may eat and take your medications after the test.  It will take at least 2 weeks to receive the results of this test from your physician. ------------------------------------------ ABOUT ESOPHAGEAL MANOMETRY Esophageal manometry (muh-NOM-uh-tree)  is a test that gauges how well your esophagus works. Your esophagus is the long, muscular tube that connects your throat to your stomach. Esophageal manometry measures the rhythmic muscle contractions (peristalsis) that occur in your esophagus when you swallow. Esophageal manometry also measures the coordination and force exerted by the muscles of your esophagus.  During esophageal manometry, a thin, flexible tube (catheter) that contains sensors is passed through your nose, down your esophagus and into your stomach. Esophageal manometry can be helpful in diagnosing some mostly uncommon disorders that affect your esophagus.  Why it's done Esophageal manometry is used to evaluate the movement (motility) of food through the esophagus and into the stomach. The test measures how well the circular bands of muscle (sphincters) at the top and bottom of your esophagus open and close, as well as the pressure, strength and pattern of the wave of esophageal muscle contractions that moves food along.  What you can expect Esophageal manometry is an outpatient procedure done without sedation. Most people tolerate it well. You may be asked to change into a hospital gown before the test starts.  During esophageal manometry  While you are sitting up, a member of your health care team sprays your throat with a numbing medication or puts numbing gel in your nose or both.  A catheter is guided through your nose into your esophagus. The catheter may be sheathed in a water-filled sleeve. It doesn't interfere with your breathing. However, your eyes may water, and you may gag. You may have a slight nosebleed from irritation.  After the catheter is in place, you may be asked to lie  on your back on an exam table, or you may be asked to remain seated.  You then swallow small sips of water. As you do, a computer connected to the catheter records the pressure, strength and pattern of your esophageal muscle contractions.  During the  test, you'll be asked to breathe slowly and smoothly, remain as still as possible, and swallow only when you're asked to do so.  A member of your health care team may move the catheter down into your stomach while the catheter continues its measurements.  The catheter then is slowly withdrawn. The test usually lasts 20 to 30 minutes.  After esophageal manometry  When your esophageal manometry is complete, you may return to your normal activities  This test typically takes 30-45 minutes to complete. ________________________________________________________________________________  We have sent the following medications to your pharmacy for you to pick up at your convenience:  Pepcid 20mg  at bedtime  Thank you for choosing me and [facility].  Vito Cirigliano, D.O.

## 2021-02-20 MED ORDER — MECLIZINE HCL 25 MG PO TABS: 25.0000 mg | ORAL_TABLET | Freq: Three times a day (TID) | ORAL | 1 refills | Status: DC | PRN

## 2021-02-22 ENCOUNTER — Telehealth: Payer: Self-pay | Admitting: General Surgery

## 2021-02-22 ENCOUNTER — Telehealth: Payer: Self-pay | Admitting: Gastroenterology

## 2021-02-22 NOTE — Telephone Encounter (Signed)
Misty Stanley with [facility] called to inform that pt called them looking for a sooner appt than January for manometry. She told pt that there was nothing available before January so pt wants to be put on a cancellation list. Misty Stanley stated that pt asked her to contact us because she did not have a "good experience with [place] so pt did not want to call the office." Misty Stanley explained to pt that this kind of requests are handled by the office but she will call on her behalf for this time.

## 2021-02-22 NOTE — Telephone Encounter (Signed)
Scheduled first available EM/PH impedence at [facility] on 05/29/2021 @8 :30am. Will send a mychart message with instructions to the patient.

## 2021-02-26 NOTE — Telephone Encounter (Signed)
Received a call from [place] at [facility] end[facility] unit, she states that they had a cancellation for an esophageal manometry tomorrow at 10:30 am with a 10 am arrival time. I told Misty Stanley that I will check with patient and give her a call back at [phone].   Spoke with patient to offer her appt for tomorrow, she states that she has not been off of her PPI. Advised that I will check with Dr. Barron Alvine and get back in touch with her.

## 2021-02-26 NOTE — Telephone Encounter (Signed)
Per Dr. Barron Alvine - Can do the Esophageal manometry portion tomorrow while on meds. But the pH/Impedance portion should be done off PPI x7 days for her. If we proceed with the pH/Impedance tomorrow, would document that this is on medications and looking for breakthrough regurgitation despite appropriate medical management. This wasn't the original plan that we discussed, but is certainly done when looking for breakthrough despite meds and would not be the most unreasonable plan if she would like to gather data quicker. Then, if needed, could repeat the pH/Impedance portion off meds in January as scheduled.   I offered these options to patient, she was very hesitant to proceed while on the medication because she wants accurate results. Pt states that she will begin holding her PPI today in hopes there is another cancellation in the upcoming weeks. Pt will hold PPI if tolerable. Pt is aware that we will let her know if there are any cancellations. Pt verbalized understanding and had no concerns at the end of the call.  I spoke with Misty Stanley at [facility] end[facility] to inform her of patient's decision, she states that she will keep patient's name to the side and will let us know if they have another cancellation in the upcoming weeks.

## 2021-03-01 ENCOUNTER — Encounter: Payer: Self-pay | Admitting: Family Medicine

## 2021-03-01 ENCOUNTER — Ambulatory Visit (INDEPENDENT_AMBULATORY_CARE_PROVIDER_SITE_OTHER): Payer: BC Managed Care – PPO | Admitting: Family Medicine

## 2021-03-01 DIAGNOSIS — E039 Hypothyroidism, unspecified: Secondary | ICD-10-CM | POA: Diagnosis not present

## 2021-03-01 DIAGNOSIS — J340 Abscess, furuncle and carbuncle of nose: Secondary | ICD-10-CM | POA: Diagnosis not present

## 2021-03-01 DIAGNOSIS — R635 Abnormal weight gain: Secondary | ICD-10-CM

## 2021-03-01 MED ORDER — MUPIROCIN 2 % EX OINT: 1.0000 "application " | TOPICAL_OINTMENT | Freq: Two times a day (BID) | CUTANEOUS | 0 refills | Status: DC

## 2021-03-01 MED ORDER — PHENTERMINE HCL 37.5 MG PO CAPS: 37.5000 mg | ORAL_CAPSULE | ORAL | 3 refills | Status: DC

## 2021-03-01 NOTE — Patient Instructions (Signed)
Managing Stress, Adult Feeling a certain amount of stress is normal. Stress helps our body and mind get ready to deal with the demands of life. Stress hormones can motivate you to do well at work and meet your responsibilities. However severe or long-lasting (chronic) stress can affect your mental and physical health. Chronic stress puts you at higher risk for anxiety, depression, and other health problems like digestive problems, muscle aches, heart disease, high blood pressure, and stroke. What are the causes? Common causes of stress include: Demands from work, such as deadlines, feeling overworked, or having long hours. Pressures at home, such as money issues, disagreements with a spouse, or parenting issues. Pressures from major life changes, such as divorce, moving, loss of a loved one, or chronic illness. You may be at higher risk for stress-related problems if you do not get enough sleep, are in poor health, do not have emotional support, or have a mental health disorder like anxiety or depression. How to recognize stress Stress can make you: Have trouble sleeping. Feel sad, anxious, irritable, or overwhelmed. Lose your appetite. Overeat or want to eat unhealthy foods. Want to use drugs or alcohol. Stress can also cause physical symptoms, such as: Sore, tense muscles, especially in the shoulders and neck. Headaches. Trouble breathing. A faster heart rate. Stomach pain, nausea, or vomiting. Diarrhea or constipation. Trouble concentrating. Follow these instructions at home: Lifestyle Identify the source of your stress and your reaction to it. See a therapist who can help you change your reactions. When there are stressful events: Talk about it with family, friends, or co-workers. Try to think realistically about stressful events and not ignore them or overreact. Try to find the positives in a stressful situation and not focus on the negatives. Cut back on responsibilities at work  and home, if possible. Ask for help from friends or family members if you need it. Find ways to cope with stress, such as: Meditation. Deep breathing. Yoga or tai chi. Progressive muscle relaxation. Doing art, playing music, or reading. Making time for fun activities. Spending time with family and friends. Get support from family, friends, or spiritual resources. Eating and drinking Eat a healthy diet. This includes: Eating foods that are high in fiber, such as beans, whole grains, and fresh fruits and vegetables. Limiting foods that are high in fat and processed sugars, such as fried and sweet foods. Do not skip meals or overeat. Drink enough fluid to keep your urine pale yellow. Alcohol use Do not drink alcohol if: Your health care provider tells you not to drink. You are pregnant, may be pregnant, or are planning to become pregnant. Drinking alcohol is a way some people try to ease their stress. This can be dangerous, so if you drink alcohol: Limit how much you use to: 0-1 drink a day for women. 0-2 drinks a day for men. Be aware of how much alcohol is in your drink. In the [place] one drink equals one 12 oz bottle of beer (355 mL), one 5 oz glass of wine (148 mL), or one 1 oz glass of hard liquor (44 mL). Activity  Include 30 minutes of exercise in your daily schedule. Exercise is a good stress reducer. Include time in your day for an activity that you find relaxing. Try taking a walk, going on a bike ride, reading a book, or listening to music. Schedule your time in a way that lowers stress, and keep a consistent schedule. Prioritize what is most important to get done.  General instructions Get enough sleep. Try to go to sleep and get up at about the same time every day. Take over-the-counter and prescription medicines only as told by your health care provider. Do not use any products that contain nicotine or tobacco, such as cigarettes, e-cigarettes, and chewing tobacco. If you  need help quitting, ask your health care provider. Do not use drugs or smoke to cope with stress. Keep all follow-up visits as told by your health care provider. This is important. Where to find support Talk with your health care provider about stress management or finding a support group. Find a therapist to work with you on your stress management techniques. Contact a health care provider if: Your stress symptoms get worse. You are unable to manage your stress at home. You are struggling to stop using drugs or alcohol. Get help right away if: You may be a danger to yourself or others. You have any thoughts of death or suicide. If you ever feel like you may hurt yourself or others, or have thoughts about taking your own life, get help right away. You can go to your nearest emergency department or call: Your local emergency services (911 in the [place] A suicide crisis helpline, such as the [place] at (912)367-1532. This is open 24 hours a day. Summary Feeling a certain amount of stress is normal, but severe or long-lasting (chronic) stress can affect your mental and physical health. Chronic stress can put you at higher risk for anxiety, depression, and other health problems like digestive problems, muscle aches, heart disease, high blood pressure, and stroke. You may be at higher risk for stress-related problems if you do not get enough sleep, are in poor health, lack emotional support, or have a mental health disorder like anxiety or depression. Identify the source of your stress and your reaction to it. Try talking about stressful events with family, friends, or co-workers, finding a coping method, or getting support from spiritual resources. If you need more help, talk with your health care provider about finding a support group or a mental health therapist. This information is not intended to replace advice given to you by your health care provider. Make sure  you discuss any questions you have with your health care provider. Document Revised: 07/20/2020 Document Reviewed: 12/08/2018 Elsevier Patient Education  [employer].

## 2021-03-03 ENCOUNTER — Encounter: Payer: Self-pay | Admitting: Family Medicine

## 2021-03-03 DIAGNOSIS — J340 Abscess, furuncle and carbuncle of nose: Secondary | ICD-10-CM | POA: Insufficient documentation

## 2021-03-03 DIAGNOSIS — R635 Abnormal weight gain: Secondary | ICD-10-CM | POA: Insufficient documentation

## 2021-03-03 NOTE — Assessment & Plan Note (Signed)
Stable at this time.  Recent TSH within normal limits.

## 2021-03-03 NOTE — Assessment & Plan Note (Signed)
She is not using any nasal steroids.  We will add mupirocin to see if this helps with healing of this.

## 2021-03-03 NOTE — Assessment & Plan Note (Signed)
We discussed medications for management of abnormal weight gain.  She does not want to try Contrave due to cost as well as concern about antidepressant with bupropion.  We will try phentermine however she does understand that if she has increased palpitations or elevation in heart rate with this we will need to discontinue.

## 2021-03-03 NOTE — Progress Notes (Signed)
Christine Hendrix - 55 y.o. female MRN [id number]  Date of birth: 08/19/1965  Subjective Chief Complaint  Patient presents with   Follow-up    HPI Christine Hendrix is a 55 year old female here today for follow-up visit.  She expresses continued concern about weight gain.  Her thyroid medication was adjusted previously however has not noticed any change in her weight.  She has noted more energy since making this change.  She feels like her appetite has been a little more difficult to control.  She does try to stay active.  She does admit to increased stress related to work and worrying about her mother.  She tries to remain strong despite this.  She is interested in adding medication to help with appetite suppression.  She does have history of atrial fibrillation however this is well controlled.  She also reports having nosebleed previously that was cauterized.  She has small ulceration inside the nare that has not healed since that time.  She denies pain.  ROS:  A comprehensive ROS was completed and negative except as noted per HPI  Allergies  Allergen Reactions   Codeine Nausea And Vomiting, Other (See Comments) and Rash    Vomiting and dizziness   Solifenacin Other (See Comments)    BLURRED VISION Other reaction(s): Hallucinations Visual disturbances   Molds & Smuts Other (See Comments)    Past Medical History:  Diagnosis Date   Arthritis    neck   Atrial fibrillation (HCC)    Colitis    chronic    Colitis    Dental crowns present    also dental implant lower right   Family history of colon cancer    Hyperlipidemia    Hypothyroidism    Kidney stone on left side 03/27/2017   Migraines    MVP (mitral valve prolapse)    Nasal turbinate hypertrophy 04/2016   Nephrolithiasis    PONV (postoperative nausea and vomiting)    Psoriasis    TMJ syndrome    limited jaw opening    Past Surgical History:  Procedure Laterality Date   ABDOMINAL HYSTERECTOMY     complete   BUNIONECTOMY WITH  HAMMERTOE RECONSTRUCTION Left 03/04/2016   4th/5th toes   CHOLECYSTECTOMY     COLONOSCOPY     More than 5 years. [facility]. [facility] [place]    ESOPHAGOGASTRODUODENOSCOPY     about 5 years ago. 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  Tobacco Use   Smoking status: Never   Smokeless tobacco: Never  Vaping Use   Vaping Use: Never used  Substance and Sexual Activity   Alcohol use: No    Alcohol/week: 0.0 standard drinks   Drug use: No   Sexual activity: Not on file  Other Topics Concern   Not on file  Social History Narrative   Not on file   Social Determinants of Health   Financial Resource Strain: Not  on file  Food Insecurity: Not on file  Transportation Needs: Not on file  Physical Activity: Not on file  Stress: Not on file  Social Connections: Not on file    Family History  Problem Relation Age of Onset   Heart disease Father    Heart failure Father    Colon cancer Father        dx early 77's    Stomach cancer Brother    Colon cancer Brother    Liver cancer Brother    Heart disease Mother    CAD Mother    Esophageal cancer Neg Hx     Health Maintenance  Topic Date Due   HIV Screening  Never done   Hepatitis C Screening  Never done   Zoster Vaccines- Shingrix (1 of 2) Never done   TETANUS/TDAP  05/26/2020   INFLUENZA VACCINE  08/23/2021 (Originally 12/24/2020)   MAMMOGRAM  08/02/2022   COLONOSCOPY (Pts 45-67yrs Insurance  coverage will need to be confirmed)  01/12/2025   COVID-19 Vaccine  Completed   HPV VACCINES  Aged Out   PAP SMEAR-Modifier  Discontinued     ----------------------------------------------------------------------------------------------------------------------------------------------------------------------------------------------------------------- Physical Exam BP (!) 142/84 (BP Location: Left Arm, Patient Position: Sitting, Cuff Size: Small)   Pulse 79   Temp (!) 97.5 F (36.4 C)   Ht 5\' 4"  (1.626 m)   Wt 155 lb 12.8 oz (70.7 kg)   SpO2 100%   BMI 26.74 kg/m   Physical Exam  ------------------------------------------------------------------------------------------------------------------------------------------------------------------------------------------------------------------- Assessment and Plan  Hypothyroidism Stable at this time.  Recent TSH within normal limits.  Abnormal weight gain We discussed medications for management of abnormal weight gain.  She does not want to try Contrave due to cost as well as concern about antidepressant with bupropion.  We will try phentermine however she does understand that if she has increased palpitations or elevation in heart rate with this we will need to discontinue.  Nasal septum ulceration She is not using any nasal steroids.  We will add mupirocin to see if this helps with healing of this.   Meds ordered this encounter  Medications   phentermine 37.5 MG capsule    Sig: Take 1 capsule (37.5 mg total) by mouth every morning.    Dispense:  30 capsule    Refill:  3   mupirocin ointment (BACTROBAN) 2 %    Sig: Apply 1 application topically 2 (two) times daily.    Dispense:  22 g    Refill:  0    Return in about 3 months (around 06/01/2021) for Weight management.    This visit occurred during the SARS-CoV-2 public health emergency.  Safety protocols were in place, including screening questions prior to the visit,  additional usage of staff PPE, and extensive cleaning of exam room while observing appropriate contact time as indicated for disinfecting solutions.

## 2021-03-04 DIAGNOSIS — G43709 Chronic migraine without aura, not intractable, without status migrainosus: Secondary | ICD-10-CM | POA: Diagnosis not present

## 2021-03-05 ENCOUNTER — Telehealth: Payer: Self-pay | Admitting: General Surgery

## 2021-03-05 NOTE — Telephone Encounter (Signed)
No EM w/ph available until 1/23

## 2021-03-05 NOTE — Telephone Encounter (Signed)
-----   Message from Edwin Cap, CMA sent at 02/22/2021  4:28 PM EDT ----- Regarding: EM w PH Try to schedule sooner.

## 2021-03-14 ENCOUNTER — Encounter: Payer: Self-pay | Admitting: Family Medicine

## 2021-03-15 ENCOUNTER — Other Ambulatory Visit: Payer: Self-pay

## 2021-03-15 ENCOUNTER — Encounter: Payer: Self-pay | Admitting: Family Medicine

## 2021-03-15 ENCOUNTER — Ambulatory Visit (INDEPENDENT_AMBULATORY_CARE_PROVIDER_SITE_OTHER): Payer: BC Managed Care – PPO | Admitting: Family Medicine

## 2021-03-15 DIAGNOSIS — M5416 Radiculopathy, lumbar region: Secondary | ICD-10-CM | POA: Diagnosis not present

## 2021-03-15 MED ORDER — PREDNISONE 50 MG PO TABS: ORAL_TABLET | ORAL | 0 refills | Status: DC

## 2021-03-15 NOTE — Patient Instructions (Signed)
Sciatica Sciatica is pain, numbness, weakness, or tingling along the path of the sciatic nerve. The sciatic nerve starts in the lower back and runs down the back of each leg. The nerve controls the muscles in the lower leg and in the back of the knee. It also provides feeling (sensation) to the back of the thigh, the lower leg, and the sole of the foot. Sciatica is a symptom of another medical condition that pinches or puts pressure on the sciatic nerve. Sciatica most often only affects one side of the body. Sciatica usually goes away on its own or with treatment. In some cases, sciatica may come back (recur). What are the causes? This condition is caused by pressure on the sciatic nerve or pinching of the nerve. This may be the result of: A disk in between the bones of the spine bulging out too far (herniated disk). Age-related changes in the spinal disks. A pain disorder that affects a muscle in the buttock. Extra bone growth near the sciatic nerve. A break (fracture) of the pelvis. Pregnancy. Tumor. This is rare. What increases the risk? The following factors may make you more likely to develop this condition: Playing sports that place pressure or stress on the spine. Having poor strength and flexibility. A history of back injury or surgery. Sitting for long periods of time. Doing activities that involve repetitive bending or lifting. Obesity. What are the signs or symptoms? Symptoms can vary from mild to very severe, and they may include: Any of these problems in the lower back, leg, hip, or buttock: Mild tingling, numbness, or dull aches. Burning sensations. Sharp pains. Numbness in the back of the calf or the sole of the foot. Leg weakness. Severe back pain that makes movement difficult. Symptoms may get worse when you cough, sneeze, or laugh, or when you sit or stand for long periods of time. How is this diagnosed? This condition may be diagnosed based on: Your symptoms and  medical history. A physical exam. Blood tests. Imaging tests, such as: X-rays. MRI. CT scan. How is this treated? In many cases, this condition improves on its own without treatment. However, treatment may include: Reducing or modifying physical activity. Exercising and stretching. Icing and applying heat to the affected area. Medicines that help to: Relieve pain and swelling. Relax your muscles. Injections of medicines that help to relieve pain, irritation, and inflammation around the sciatic nerve (steroids). Surgery. Follow these instructions at home: Medicines Take over-the-counter and prescription medicines only as told by your health care provider. Ask your health care provider if the medicine prescribed to you: Requires you to avoid driving or using heavy machinery. Can cause constipation. You may need to take these actions to prevent or treat constipation: Drink enough fluid to keep your urine pale yellow. Take over-the-counter or prescription medicines. Eat foods that are high in fiber, such as beans, whole grains, and fresh fruits and vegetables. Limit foods that are high in fat and processed sugars, such as fried or sweet foods. Managing pain   If directed, put ice on the affected area. Put ice in a plastic bag. Place a towel between your skin and the bag. Leave the ice on for 20 minutes, 2-3 times a day. If directed, apply heat to the affected area. Use the heat source that your health care provider recommends, such as a moist heat pack or a heating pad. Place a towel between your skin and the heat source. Leave the heat on for 20-30 minutes. Remove   the heat if your skin turns bright red. This is especially important if you are unable to feel pain, heat, or cold. You may have a greater risk of getting burned. Activity  Return to your normal activities as told by your health care provider. Ask your health care provider what activities are safe for you. Avoid  activities that make your symptoms worse. Take brief periods of rest throughout the day. When you rest for longer periods, mix in some mild activity or stretching between periods of rest. This will help to prevent stiffness and pain. Avoid sitting for long periods of time without moving. Get up and move around at least one time each hour. Exercise and stretch regularly, as told by your health care provider. Do not lift anything that is heavier than 10 lb (4.5 kg) while you have symptoms of sciatica. When you do not have symptoms, you should still avoid heavy lifting, especially repetitive heavy lifting. When you lift objects, always use proper lifting technique, which includes: Bending your knees. Keeping the load close to your body. Avoiding twisting. General instructions Maintain a healthy weight. Excess weight puts extra stress on your back. Wear supportive, comfortable shoes. Avoid wearing high heels. Avoid sleeping on a mattress that is too soft or too hard. A mattress that is firm enough to support your back when you sleep may help to reduce your pain. Keep all follow-up visits as told by your health care provider. This is important. Contact a health care provider if: You have pain that: Wakes you up when you are sleeping. Gets worse when you lie down. Is worse than you have experienced in the past. Lasts longer than 4 weeks. You have an unexplained weight loss. Get help right away if: You are not able to control when you urinate or have bowel movements (incontinence). You have: Weakness in your lower back, pelvis, buttocks, or legs that gets worse. Redness or swelling of your back. A burning sensation when you urinate. Summary Sciatica is pain, numbness, weakness, or tingling along the path of the sciatic nerve. This condition is caused by pressure on the sciatic nerve or pinching of the nerve. Sciatica can cause pain, numbness, or tingling in the lower back, legs, hips, and  buttocks. Treatment often includes rest, exercise, medicines, and applying ice or heat. This information is not intended to replace advice given to you by your health care provider. Make sure you discuss any questions you have with your health care provider. Document Revised: 05/31/2018 Document Reviewed: 05/31/2018 Elsevier Patient Education  2022 Elsevier Inc.  

## 2021-03-17 DIAGNOSIS — M5416 Radiculopathy, lumbar region: Secondary | ICD-10-CM | POA: Insufficient documentation

## 2021-03-17 NOTE — Progress Notes (Signed)
Christine Hendrix - 55 y.o. female MRN [id number]  Date of birth: 07-11-1965  Subjective Chief Complaint  Patient presents with   Flank Pain    HPI Christine Hendrix is a 55 year old female here today with complaint of back pain and sciatica.  This is located on the left side.  She has had this in the past but this episode has been longer lasting than previous episodes.  She denies weakness of the leg.  Pain has improved some since initial onset.  Radiation is mainly in the buttock with some occasional radiation into the back of the leg.  There is no bowel or bladder incontinence.  ROS:  A comprehensive ROS was completed and negative except as noted per HPI  Allergies  Allergen Reactions   Codeine Nausea And Vomiting, Other (See Comments) and Rash    Vomiting and dizziness   Solifenacin Other (See Comments)    BLURRED VISION Other reaction(s): Hallucinations Visual disturbances   Molds & Smuts Other (See Comments)    Past Medical History:  Diagnosis Date   Arthritis    neck   Atrial fibrillation (HCC)    Colitis    chronic    Colitis    Dental crowns present    also dental implant lower right   Family history of colon cancer    Hyperlipidemia    Hypothyroidism    Kidney stone on left side 03/27/2017   Migraines    MVP (mitral valve prolapse)    Nasal turbinate hypertrophy 04/2016   Nephrolithiasis    PONV (postoperative nausea and vomiting)    Psoriasis    TMJ syndrome    limited jaw opening    Past Surgical History:  Procedure Laterality Date   ABDOMINAL HYSTERECTOMY     complete   BUNIONECTOMY WITH HAMMERTOE RECONSTRUCTION Left 03/04/2016   4th/5th toes   CHOLECYSTECTOMY     COLONOSCOPY     More than 5 years. [facility]. [employer] [place]    ESOPHAGOGASTRODUODENOSCOPY     about 5 years ago. 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  Tobacco Use   Smoking status: Never   Smokeless tobacco: Never  Vaping Use   Vaping Use: Never used  Substance and Sexual Activity   Alcohol use: No    Alcohol/week: 0.0 standard drinks   Drug use: No   Sexual activity: Not on file  Other Topics Concern   Not on file  Social History Narrative   Not on file   Social Determinants of Health   Financial Resource Strain: Not on file  Food Insecurity: Not on file  Transportation Needs: Not on file  Physical Activity: Not on file  Stress: Not on file  Social Connections: Not on file    Family History  Problem Relation Age of Onset   Heart disease Father    Heart failure Father    Colon cancer Father  dx early 24's    Stomach cancer Brother    Colon cancer Brother    Liver cancer Brother    Heart disease Mother    CAD Mother    Esophageal cancer Neg Hx     Health Maintenance  Topic Date Due   HIV Screening  Never done   Hepatitis C Screening  Never done   Zoster Vaccines- Shingrix (1 of 2) Never done   TETANUS/TDAP  05/26/2020   COVID-19 Vaccine (5 - Booster for Moderna series) 01/27/2021   INFLUENZA VACCINE  08/23/2021 (Originally 12/24/2020)   MAMMOGRAM  08/02/2022   COLONOSCOPY (Pts 45-56yrs Insurance coverage will need to be confirmed)  01/12/2025   Pneumococcal Vaccine 27-38 Years old  Aged Out   HPV VACCINES  Aged Out   PAP SMEAR-Modifier  Discontinued      ----------------------------------------------------------------------------------------------------------------------------------------------------------------------------------------------------------------- Physical Exam BP 131/83 (BP Location: Left Arm, Patient Position: Sitting, Cuff Size: Normal)   Pulse 87   Ht 5\' 4"  (1.626 m)   Wt 155 lb (70.3 kg)   SpO2 99%   BMI 26.61 kg/m   Physical Exam Constitutional:      Appearance: Normal appearance.  Eyes:     General: No scleral icterus. Cardiovascular:     Rate and Rhythm: Normal rate and regular rhythm.  Pulmonary:     Effort: Pulmonary effort is normal.     Breath sounds: Normal breath sounds.  Musculoskeletal:     Cervical back: Neck supple.     Comments: Range of motion is fairly good.  She does have increased pain with extension.  Positive straight leg raise.  Tenderness palpation along the lumbar paraspinal muscles.  Neurological:     General: No focal deficit present.     Mental Status: She is alert.    ------------------------------------------------------------------------------------------------------------------------------------------------------------------------------------------------------------------- Assessment and Plan  Left lumbar radiculopathy Given handout for home exercises.  Can use heat or ice as needed for comfort.  Adding prednisone 50 mg daily x5 days.  If not improving with this we can consider adding formal physical therapy and/or imaging if needed.   Meds ordered this encounter  Medications   predniSONE (DELTASONE) 50 MG tablet    Sig: Take 1 tab PO daily x5 days    Dispense:  5 tablet    Refill:  0    No follow-ups on file.    This visit occurred during the SARS-CoV-2 public health emergency.  Safety protocols were in place, including screening questions prior to the visit, additional usage of staff PPE, and extensive cleaning of exam room while observing appropriate contact  time as indicated for disinfecting solutions.

## 2021-03-17 NOTE — Assessment & Plan Note (Signed)
Given handout for home exercises.  Can use heat or ice as needed for comfort.  Adding prednisone 50 mg daily x5 days.  If not improving with this we can consider adding formal physical therapy and/or imaging if needed.

## 2021-03-24 ENCOUNTER — Emergency Department (INDEPENDENT_AMBULATORY_CARE_PROVIDER_SITE_OTHER)
Admission: EM | Admit: 2021-03-24 | Discharge: 2021-03-24 | Disposition: A | Discharge status: Home / Self Care | Payer: BC Managed Care – PPO | Source: Home / Self Care | Attending: Family Medicine | Admitting: Family Medicine

## 2021-03-24 ENCOUNTER — Other Ambulatory Visit: Payer: Self-pay

## 2021-03-24 ENCOUNTER — Encounter: Payer: Self-pay | Admitting: Emergency Medicine

## 2021-03-24 DIAGNOSIS — J209 Acute bronchitis, unspecified: Secondary | ICD-10-CM | POA: Diagnosis not present

## 2021-03-24 DIAGNOSIS — R0602 Shortness of breath: Secondary | ICD-10-CM | POA: Diagnosis not present

## 2021-03-24 MED ORDER — CLARITHROMYCIN 500 MG PO TABS: 500.0000 mg | ORAL_TABLET | Freq: Two times a day (BID) | ORAL | 0 refills | Status: DC

## 2021-03-24 MED ORDER — PREDNISONE 20 MG PO TABS: 20.0000 mg | ORAL_TABLET | Freq: Two times a day (BID) | ORAL | 0 refills | Status: DC

## 2021-03-24 MED ORDER — HYDROCOD POLST-CPM POLST ER 10-8 MG/5ML PO SUER: 5.0000 mL | Freq: Two times a day (BID) | ORAL | 0 refills | Status: DC | PRN

## 2021-03-24 NOTE — ED Provider Notes (Signed)
[name]    CSN: [id number] Arrival date & time: 03/24/21  1515      History   Chief Complaint Chief Complaint  Patient presents with   Cough    HPI Christine Hendrix is a 55 y.o. female.   HPI Patient's had a cough cold runny nose sore throat.  Felt she had a virus.  Home COVID test is negative.  She has been sick for a week the cough is getting worse.  She is feeling short of breath.  She called the on-call physician for her medical group.  They called an albuterol and Tessalon for her.  In spite of this she still could not sleep last night because of the coughing.  Feels a lot of chest congestion.  She is a non-smoker.  She does not have asthma.  Past Medical History:  Diagnosis Date   Arthritis    neck   Atrial fibrillation ([place])    Colitis    chronic    Colitis    Dental crowns present    also dental implant lower right   Family history of colon cancer    Hyperlipidemia    Hypothyroidism    Kidney stone on left side 03/27/2017   Migraines    MVP (mitral valve prolapse)    Nasal turbinate hypertrophy 04/2016   Nephrolithiasis    PONV (postoperative nausea and vomiting)    Psoriasis    TMJ syndrome    limited jaw opening    Patient Active Problem List   Diagnosis Date Noted   Left lumbar radiculopathy 03/17/2021   Abnormal weight gain 03/03/2021   Nasal septum ulceration 03/03/2021   Surgical menopause on hormone replacement therapy 10/21/2019   Right lower quadrant abdominal pain 04/18/2019   Proteinuria 02/21/2019   History of abnormal cervical Pap smear 01/14/2019   Epistaxis 06/11/2018   Nasal sinus congestion 05/13/2018   Snoring 05/13/2018   PAF (paroxysmal atrial fibrillation) ([place]) 02/26/2018   H/O: hysterectomy 12/04/2017   Women's annual routine gynecological examination 12/04/2017   Family history of colon cancer in father 09/18/2017   Postmenopausal HRT (hormone replacement therapy) 10/24/2016   Allergic rhinitis 10/08/2016    Adrenal mass, 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 [place]    ESOPHAGOGASTRODUODENOSCOPY     about 5 years ago. [facility] [employer]   TMJ ARTHROPLASTY     TRIGEMINAL NERVE DECOMPRESSION     TURBINATE REDUCTION Bilateral 05/23/2016   Procedure: BILATERAL TURBINATE REDUCTION;  Surgeon: Leta Baptist, MD;  Location: [place];  Service: ENT;  Laterality: Bilateral;    OB History   No obstetric history on file.      Home Medications    Prior to Admission medications   Medication Sig Start Date End Date Taking? Authorizing Provider  amitriptyline (ELAVIL) 25 MG tablet Take 25 mg by mouth at bedtime. 03/04/21  Yes [provider]  aspirin EC 81 MG tablet Take 1 tablet (81 mg total) by mouth daily. 03/24/18  Yes Lewayne Bunting, MD  chlorpheniramine-HYDROcodone (TUSSIONEX PENNKINETIC ER) 10-8 MG/5ML SUER Take 5 mLs by mouth every 12 (twelve) hours as needed for cough. 03/24/21  Yes Eustace Moore, MD  clarithromycin (BIAXIN) 500 MG tablet Take 1 tablet (500 mg total) by mouth 2 (two) times daily. 03/24/21  Yes Eustace Moore, MD  diltiazem (CARDIZEM CD) 120 MG 24 hr capsule Take 1 capsule (120 mg total) by mouth daily. 09/22/18 03/24/21 Yes Lewayne Bunting, MD  EPINEPHrine 0.3 mg/0.3 mL IJ SOAJ injection SMARTSIG:0.3 Milliliter(s) IM Once PRN 01/26/21  Yes  [provider]  Erenumab-aooe (AIMOVIG) 140 MG/ML SOAJ Inject 140 mg into the skin every 30 (thirty) days. 12/11/20  Yes Everrett Coombe, DO  estrogens, conjugated, (PREMARIN) 1.25 MG tablet Take 1 tablet (1.25 mg total) by mouth daily. 02/18/21  Yes Everrett Coombe, DO  famotidine (PEPCID) 20 MG tablet Take 1 tablet (20 mg total) by mouth at bedtime. 02/19/21  Yes Cirigliano, Vito V, DO  ibuprofen (ADVIL) 800 MG tablet Take 1 tablet (800 mg total) by mouth every 8 (eight) hours as needed. 12/11/20  Yes Everrett Coombe, DO  levothyroxine (SYNTHROID) 75 MCG tablet Take 1 tablet (75 mcg total) by mouth daily before breakfast. 10/23/20  Yes Everrett Coombe, DO  meclizine (ANTIVERT) 25 MG tablet Take 1 tablet (25 mg total) by mouth 3 (three) times daily as needed for dizziness. 02/20/21  Yes Matthews, Cody, DO  NURTEC 75 MG TBDP Take 1 tablet by mouth every other day as needed. 03/04/21  Yes [provider]  ondansetron (ZOFRAN-ODT) 8 MG disintegrating tablet Take 1 tablet (8 mg total) by mouth every 8 (eight) hours as needed for nausea or vomiting. 01/04/21  Yes Everrett Coombe, DO  pantoprazole (PROTONIX) 40 MG tablet TAKE 1 TABLET BY MOUTH EVERY DAY 02/20/21  Yes Cirigliano, Vito V, DO  phentermine 37.5 MG capsule Take 1 capsule (37.5 mg total) by mouth every morning. 03/01/21  Yes Everrett Coombe, DO  predniSONE (DELTASONE) 20 MG tablet Take 1 tablet (20 mg total) by mouth 2 (two) times daily with a meal. 03/24/21  Yes Eustace Moore, MD  traZODone (DESYREL) 50 MG tablet TAKE 1 TO 2 TABLETS AT BEDTIME AS NEEDED FOR SLEEP 02/21/21  Yes Everrett Coombe, DO  zonisamide (ZONEGRAN) 25 MG capsule Take by mouth. 03/04/21  Yes [provider]  loteprednol (LOTEMAX) 0.5 % ophthalmic suspension SMARTSIG:In Eye(s) 11/10/19   [provider]  mupirocin ointment (BACTROBAN) 2 % Apply 1 application topically 2 (two) times daily. 03/01/21   Everrett Coombe, DO    Family History Family  History  Problem Relation Age of Onset   Heart disease Father    Heart failure Father    Colon cancer Father        dx early 56's    Stomach cancer Brother    Colon cancer Brother    Liver cancer Brother    Heart disease Mother    CAD Mother    Esophageal cancer Neg Hx     Social History Social History   Tobacco Use   Smoking status: Never   Smokeless tobacco: Never  Vaping Use   Vaping Use: Never used  Substance Use Topics   Alcohol use: No    Alcohol/week: 0.0 standard drinks   Drug use: No     Allergies   Codeine, Solifenacin,  and Molds & smuts   Review of Systems Review of Systems See HPI  Physical Exam Triage Vital Signs ED Triage Vitals  Enc Vitals Group     BP 03/24/21 1527 131/85     Pulse Rate 03/24/21 1527 (!) 105     Resp 03/24/21 1527 20     Temp 03/24/21 1527 99 F (37.2 C)     Temp Source 03/24/21 1527 Oral     SpO2 03/24/21 1527 99 %     Weight --      Height --      Head Circumference --      Peak Flow --      Pain Score 03/24/21 1524 5     Pain Loc --      Pain Edu? --      Excl. in [place]? --    No data found.  Updated Vital Signs BP 131/85 (BP Location: Right Arm)   Pulse (!) 105   Temp 99 F (37.2 C) (Oral)   Resp 20   SpO2 99%       Physical Exam Constitutional:      General: She is not in acute distress.    Appearance: She is well-developed. She is ill-appearing.     Comments: Winded with conversation  HENT:     Head: Normocephalic and atraumatic.     Right Ear: Tympanic membrane and ear canal normal.     Left Ear: Tympanic membrane and ear canal normal.     Nose: No congestion.     Mouth/Throat:     Pharynx: Posterior oropharyngeal erythema present.  Eyes:     Conjunctiva/sclera: Conjunctivae normal.     Pupils: Pupils are equal, round, and reactive to light.  Cardiovascular:     Rate and Rhythm: Normal rate and regular rhythm.     Heart sounds: Normal heart sounds.  Pulmonary:     Effort: Pulmonary effort is  normal. No respiratory distress.     Breath sounds: Normal breath sounds. No wheezing, rhonchi or rales.  Abdominal:     General: There is no distension.     Palpations: Abdomen is soft.  Musculoskeletal:        General: Normal range of motion.     Cervical back: Normal range of motion.  Lymphadenopathy:     Cervical: No cervical adenopathy.  Skin:    General: Skin is warm and dry.  Neurological:     Mental Status: She is alert.     UC Treatments / Results  Labs (all labs ordered are listed, but only abnormal results are displayed) Labs Reviewed - No data to display  EKG   Radiology No results found.  Procedures Procedures (including critical care time)  Medications Ordered in UC Medications - No data to display  Initial Impression / Assessment and Plan / UC Course  I have reviewed the triage vital signs and the nursing notes.  Pertinent labs & imaging results that were available during my care of the patient were reviewed by me and considered in my medical decision making (see chart for details).     Respiratory infection with worsening symptoms after a week of conservative care.  We will add an antibiotic.  Prednisone for her shortness of breath.  Since she is not getting relief from her cough with Delsym and Tessalon, will add a limited amount of Tussionex Final Clinical Impressions(s) / UC Diagnoses   Final diagnoses:  Acute bronchitis, unspecified organism  Shortness of breath  Discharge Instructions      Take the antibiotic 2 x a day  take the prednisone 2 x a day take medicine with food Continue drinking lots of fluids You may take Delsym and Tessalon for cough.  When cough is severe try Tussionex.  This is a small amount of hydrocodone so take with food Call your doctor if not improving in a couple days    ED Prescriptions     Medication Sig Dispense Auth. Provider   predniSONE (DELTASONE) 20 MG tablet Take 1 tablet (20 mg total) by mouth 2  (two) times daily with a meal. 10 tablet Raylene Everts, MD   chlorpheniramine-HYDROcodone [facility] PENNKINETIC ER) 10-8 MG/5ML SUER Take 5 mLs by mouth every 12 (twelve) hours as needed for cough. 115 mL Raylene Everts, MD   clarithromycin (BIAXIN) 500 MG tablet Take 1 tablet (500 mg total) by mouth 2 (two) times daily. 14 tablet Raylene Everts, MD      PDMP not reviewed this encounter.   Raylene Everts, MD 03/24/21 [phone]

## 2021-03-24 NOTE — Discharge Instructions (Signed)
Take the antibiotic 2 x a day  take the prednisone 2 x a day take medicine with food Continue drinking lots of fluids You may take Delsym and Tessalon for cough.  When cough is severe try Tussionex.  This is a small amount of hydrocodone so take with food Call your doctor if not improving in a couple days

## 2021-03-24 NOTE — ED Triage Notes (Signed)
Patient presents to Urgent Care with complaints of cough spells causing sob since 6-7 days ago. Patient reports cough-dry, fatigued, hoarseness. Denies fever or chills. Did home covid test was negative. Using albuterol inhaler 3 times a day and Tessalon pearls with minimum relief and Delysm. Able to get some yellow greenish sputum.

## 2021-03-31 ENCOUNTER — Other Ambulatory Visit: Payer: Self-pay

## 2021-03-31 ENCOUNTER — Emergency Department (INDEPENDENT_AMBULATORY_CARE_PROVIDER_SITE_OTHER): Payer: BC Managed Care – PPO

## 2021-03-31 ENCOUNTER — Emergency Department (INDEPENDENT_AMBULATORY_CARE_PROVIDER_SITE_OTHER)
Admission: EM | Admit: 2021-03-31 | Discharge: 2021-03-31 | Disposition: A | Discharge status: Home / Self Care | Payer: BC Managed Care – PPO | Source: Home / Self Care | Attending: Family Medicine | Admitting: Family Medicine

## 2021-03-31 DIAGNOSIS — R058 Other specified cough: Secondary | ICD-10-CM | POA: Diagnosis not present

## 2021-03-31 DIAGNOSIS — R059 Cough, unspecified: Secondary | ICD-10-CM | POA: Diagnosis not present

## 2021-03-31 MED ORDER — GABAPENTIN 100 MG PO CAPS: 100.0000 mg | ORAL_CAPSULE | Freq: Three times a day (TID) | ORAL | 0 refills | Status: DC

## 2021-03-31 MED ORDER — HYDROCOD POLST-CPM POLST ER 10-8 MG/5ML PO SUER: 5.0000 mL | Freq: Two times a day (BID) | ORAL | 0 refills | Status: DC | PRN

## 2021-03-31 NOTE — Discharge Instructions (Signed)
The persistent cough is from bronchial irritation, and not persistent infection Drink lots of fluids Run a humidifier or vaporizer Take the Tussionex 2 times a day Take gabapentin 3 times a day Stay home Call your doctor if not improving by Wednesday

## 2021-03-31 NOTE — ED Triage Notes (Signed)
Pt states that she was seen recently.  Pt states that she has a cough, sob, chest congestion, and fatigue.   Pt states that she is feeling worse. Pt states that she is vaccinated.

## 2021-03-31 NOTE — ED Provider Notes (Signed)
[facility]    CSN: [id number] Arrival date & time: 03/31/21  1441      History   Chief Complaint Chief Complaint  Patient presents with   Cough    Pt states that she was seen recently. Pt states that she still has a cough, chest congestion, sob and fatigue.     HPI Christine Hendrix is a 55 y.o. female.   HPI  Patient has persistent cough and shortness of breath.  She states that she is feeling worse over time.  She was seen on 03/19/2021 for respiratory infection cough.  She was given Biaxin, Tessalon, and prednisone.  She states the cough persisted and she was given (at her request) Tussionex.  She states that the Tussionex "saved me".  She briefly thought she was getting better, but is here because she still has cough, shortness of breath, chest congestion, and severe fatigue.  She is COVID vaccinated.  She had COVID testing that was negative.  She did not have sudden onset which would indicate influenza.  She does not have underlying lung disease.  Past Medical History:  Diagnosis Date   Arthritis    neck   Atrial fibrillation ([facility])    Colitis    chronic    Colitis    Dental crowns present    also dental implant lower right   Family history of colon cancer    Hyperlipidemia    Hypothyroidism    Kidney stone on left side 03/27/2017   Migraines    MVP (mitral valve prolapse)    Nasal turbinate hypertrophy 04/2016   Nephrolithiasis    PONV (postoperative nausea and vomiting)    Psoriasis    TMJ syndrome    limited jaw opening    Patient Active Problem List   Diagnosis Date Noted   Left lumbar radiculopathy 03/17/2021   Abnormal weight gain 03/03/2021   Nasal septum ulceration 03/03/2021   Surgical menopause on hormone replacement therapy 10/21/2019   Right lower quadrant abdominal pain 04/18/2019   Proteinuria 02/21/2019   History of abnormal cervical Pap smear 01/14/2019   Epistaxis 06/11/2018   Nasal sinus congestion 05/13/2018   Snoring 05/13/2018    PAF (paroxysmal atrial fibrillation) ([facility]) 02/26/2018   H/O: hysterectomy 12/04/2017   Women's annual routine gynecological examination 12/04/2017   Family history of colon cancer in father 09/18/2017   Postmenopausal HRT (hormone replacement therapy) 10/24/2016   Allergic rhinitis 10/08/2016   Adrenal mass, left ([facility]) 05/15/2015   Mixed incontinence 05/06/2015   Metatarsal deformity 08/25/2014   Acquired hammer toes of both feet 08/25/2014   Intractable chronic migraine without aura and with status migrainosus 02/24/2014   Hypothyroidism 09/02/2013   Neck pain 09/02/2013   Restless legs syndrome 09/02/2013   Insomnia 09/02/2013   Nonrheumatic mitral valve prolapse 05/30/2011    Past Surgical History:  Procedure Laterality Date   ABDOMINAL HYSTERECTOMY     complete   BUNIONECTOMY WITH HAMMERTOE RECONSTRUCTION Left 03/04/2016   4th/5th toes   CHOLECYSTECTOMY     COLONOSCOPY     More than 5 years. [facility]. [place] [place]    ESOPHAGOGASTRODUODENOSCOPY     about 5 years ago. [facility] [employer]   TMJ ARTHROPLASTY     TRIGEMINAL NERVE DECOMPRESSION     TURBINATE REDUCTION Bilateral 05/23/2016   Procedure: BILATERAL TURBINATE REDUCTION;  Surgeon: Newman Pies, MD;  Location: [facility];  Service: ENT;  Laterality: Bilateral;    OB History  No obstetric history on file.      Home Medications    Prior to Admission medications   Medication Sig Start Date End Date Taking? Authorizing Provider  amitriptyline (ELAVIL) 25 MG tablet Take 25 mg by mouth at bedtime. 03/04/21  Yes [provider]  aspirin EC 81 MG tablet Take 1 tablet (81 mg total) by mouth daily. 03/24/18  Yes Lelon Perla, MD  EPINEPHrine 0.3 mg/0.3 mL IJ SOAJ injection SMARTSIG:0.3 Milliliter(s) IM Once PRN 01/26/21  Yes [provider]  Erenumab-aooe (AIMOVIG) 140 MG/ML SOAJ Inject 140 mg into the skin every 30 (thirty) days. 12/11/20  Yes Luetta Nutting, DO   estrogens, conjugated, (PREMARIN) 1.25 MG tablet Take 1 tablet (1.25 mg total) by mouth daily. 02/18/21  Yes Luetta Nutting, DO  famotidine (PEPCID) 20 MG tablet Take 1 tablet (20 mg total) by mouth at bedtime. 02/19/21  Yes Cirigliano, Vito V, DO  gabapentin (NEURONTIN) 100 MG capsule Take 1 capsule (100 mg total) by mouth 3 (three) times daily. 03/31/21  Yes Raylene Everts, MD  ibuprofen (ADVIL) 800 MG tablet Take 1 tablet (800 mg total) by mouth every 8 (eight) hours as needed. 12/11/20  Yes Luetta Nutting, DO  levothyroxine (SYNTHROID) 75 MCG tablet Take 1 tablet (75 mcg total) by mouth daily before breakfast. 10/23/20  Yes Luetta Nutting, DO  loteprednol (LOTEMAX) 0.5 % ophthalmic suspension SMARTSIG:In Eye(s) 11/10/19  Yes [provider]  meclizine (ANTIVERT) 25 MG tablet Take 1 tablet (25 mg total) by mouth 3 (three) times daily as needed for dizziness. 02/20/21  Yes Luetta Nutting, DO  mupirocin ointment (BACTROBAN) 2 % Apply 1 application topically 2 (two) times daily. 03/01/21  Yes Matthews, Cody, DO  NURTEC 75 MG TBDP Take 1 tablet by mouth every other day as needed. 03/04/21  Yes [provider]  ondansetron (ZOFRAN-ODT) 8 MG disintegrating tablet Take 1 tablet (8 mg total) by mouth every 8 (eight) hours as needed for nausea or vomiting. 01/04/21  Yes Luetta Nutting, DO  pantoprazole (PROTONIX) 40 MG tablet TAKE 1 TABLET BY MOUTH EVERY DAY 02/20/21  Yes Cirigliano, Vito V, DO  phentermine 37.5 MG capsule Take 1 capsule (37.5 mg total) by mouth every morning. 03/01/21  Yes Luetta Nutting, DO  traZODone (DESYREL) 50 MG tablet TAKE 1 TO 2 TABLETS AT BEDTIME AS NEEDED FOR SLEEP 02/21/21  Yes Luetta Nutting, DO  zonisamide (ZONEGRAN) 25 MG capsule Take by mouth. 03/04/21  Yes [provider]  chlorpheniramine-HYDROcodone (TUSSIONEX PENNKINETIC ER) 10-8 MG/5ML SUER Take 5 mLs by mouth every 12 (twelve) hours as needed for cough. 03/31/21   Raylene Everts, MD  diltiazem  (CARDIZEM CD) 120 MG 24 hr capsule Take 1 capsule (120 mg total) by mouth daily. 09/22/18 03/24/21  Lelon Perla, MD    Family History Family History  Problem Relation Age of Onset   Heart disease Father    Heart failure Father    Colon cancer Father        dx early 49's    Stomach cancer Brother    Colon cancer Brother    Liver cancer Brother    Heart disease Mother    CAD Mother    Esophageal cancer Neg Hx     Social History Social History   Tobacco Use   Smoking status: Never   Smokeless tobacco: Never  Vaping Use   Vaping Use: Never used  Substance Use Topics   Alcohol use: No    Alcohol/week:  0.0 standard drinks   Drug use: No     Allergies   Codeine, Solifenacin, and Molds & smuts   Review of Systems Review of Systems See HPI  Physical Exam Triage Vital Signs ED Triage Vitals  Enc Vitals Group     BP 03/31/21 1522 (!) 142/89     Pulse Rate 03/31/21 1522 (!) 104     Resp 03/31/21 1522 20     Temp 03/31/21 1522 98.2 F (36.8 C)     Temp Source 03/31/21 1522 Oral     SpO2 03/31/21 1522 96 %     Weight 03/31/21 1458 149 lb (67.6 kg)     Height 03/31/21 1458 5\' 4"  (1.626 m)     Head Circumference --      Peak Flow --      Pain Score 03/31/21 1457 0     Pain Loc --      Pain Edu? --      Excl. in [place]? --    No data found.  Updated Vital Signs BP (!) 142/89 (BP Location: Right Arm)   Pulse (!) 104   Temp 98.2 F (36.8 C) (Oral)   Resp 20   Ht 5\' 4"  (1.626 m)   Wt 67.6 kg   SpO2 96%   BMI 25.58 kg/m       Physical Exam Constitutional:      General: She is not in acute distress.    Appearance: She is well-developed and normal weight. She is ill-appearing.     Comments: Appears tired.  Almost constant coughing  HENT:     Head: Normocephalic and atraumatic.     Right Ear: Tympanic membrane and ear canal normal.     Left Ear: Tympanic membrane and ear canal normal.     Nose: No congestion.     Mouth/Throat:     Pharynx: No posterior  oropharyngeal erythema.  Eyes:     Conjunctiva/sclera: Conjunctivae normal.     Pupils: Pupils are equal, round, and reactive to light.  Cardiovascular:     Rate and Rhythm: Normal rate and regular rhythm.  Pulmonary:     Effort: Pulmonary effort is normal. No respiratory distress.     Breath sounds: Normal breath sounds. No stridor.  Abdominal:     General: There is no distension.     Palpations: Abdomen is soft.  Musculoskeletal:        General: Normal range of motion.     Cervical back: Normal range of motion and neck supple.  Skin:    General: Skin is warm and dry.  Neurological:     Mental Status: She is alert.     UC Treatments / Results  Labs (all labs ordered are listed, but only abnormal results are displayed) Labs Reviewed - No data to display  EKG   Radiology DG Chest 2 View  Result Date: 03/31/2021 CLINICAL DATA:  Cough. EXAM: CHEST - 2 VIEW COMPARISON:  December 05, 2017 FINDINGS: The heart size and mediastinal contours are within normal limits. Both lungs are clear. The visualized skeletal structures are unremarkable. IMPRESSION: No active cardiopulmonary disease. Electronically Signed   By: Ted Mcalpine M.D.   On: 03/31/2021 16:09    Procedures Procedures (including critical care time)  Medications Ordered in UC Medications - No data to display  Initial Impression / Assessment and Plan / UC Course  I have reviewed the triage vital signs and the nursing notes.  Pertinent labs & imaging results that  were available during my care of the patient were reviewed by me and considered in my medical decision making (see chart for details).     Lungs are clear.  Chest x-ray is normal.  She needs to consult with her personal physician if not improving by next week Final Clinical Impressions(s) / UC Diagnoses   Final diagnoses:  Post-viral cough syndrome     Discharge Instructions      The persistent cough is from bronchial irritation, and not  persistent infection Drink lots of fluids Run a humidifier or vaporizer Take the Tussionex 2 times a day Take gabapentin 3 times a day Stay home Call your doctor if not improving by Wednesday   ED Prescriptions     Medication Sig Dispense Auth. Provider   chlorpheniramine-HYDROcodone (TUSSIONEX PENNKINETIC ER) 10-8 MG/5ML SUER Take 5 mLs by mouth every 12 (twelve) hours as needed for cough. 115 mL Raylene Everts, MD   gabapentin (NEURONTIN) 100 MG capsule Take 1 capsule (100 mg total) by mouth 3 (three) times daily. 30 capsule Raylene Everts, MD      PDMP not reviewed this encounter.   Raylene Everts, MD 03/31/21 [phone]

## 2021-04-10 ENCOUNTER — Other Ambulatory Visit: Payer: Self-pay | Admitting: Gastroenterology

## 2021-04-13 ENCOUNTER — Encounter: Payer: Self-pay | Admitting: Family Medicine

## 2021-04-15 NOTE — Telephone Encounter (Signed)
Patient has been scheduled

## 2021-04-22 ENCOUNTER — Other Ambulatory Visit: Payer: Self-pay | Admitting: Family Medicine

## 2021-04-26 ENCOUNTER — Encounter: Payer: Self-pay | Admitting: Family Medicine

## 2021-04-26 ENCOUNTER — Ambulatory Visit (INDEPENDENT_AMBULATORY_CARE_PROVIDER_SITE_OTHER): Payer: BC Managed Care – PPO | Admitting: Family Medicine

## 2021-04-26 ENCOUNTER — Other Ambulatory Visit: Payer: Self-pay

## 2021-04-26 VITALS — BP 138/87 | HR 86 | Temp 98.1°F | Ht 64.0 in | Wt 153.1 lb

## 2021-04-26 DIAGNOSIS — M5441 Lumbago with sciatica, right side: Secondary | ICD-10-CM

## 2021-04-26 DIAGNOSIS — M5442 Lumbago with sciatica, left side: Secondary | ICD-10-CM

## 2021-04-26 DIAGNOSIS — M5416 Radiculopathy, lumbar region: Secondary | ICD-10-CM

## 2021-04-26 DIAGNOSIS — G8929 Other chronic pain: Secondary | ICD-10-CM

## 2021-04-26 DIAGNOSIS — L409 Psoriasis, unspecified: Secondary | ICD-10-CM

## 2021-04-26 MED ORDER — HYDROXYZINE HCL 25 MG PO TABS: 25.0000 mg | ORAL_TABLET | Freq: Four times a day (QID) | ORAL | 1 refills | Status: DC | PRN

## 2021-04-26 MED ORDER — CLOBETASOL PROPIONATE 0.05 % EX LIQD: Freq: Two times a day (BID) | CUTANEOUS | 1 refills | Status: DC

## 2021-04-26 NOTE — Patient Instructions (Signed)
Try Omron Tens unit

## 2021-04-28 DIAGNOSIS — L409 Psoriasis, unspecified: Secondary | ICD-10-CM | POA: Insufficient documentation

## 2021-04-28 NOTE — Assessment & Plan Note (Signed)
Clobetasol spray renewed.  Hydroxyzine renewed to use as needed for itching.

## 2021-04-28 NOTE — Assessment & Plan Note (Signed)
Continues to have problems with low back pain with radiculopathy.  She does now have some radiculopathy into the right leg at times.  Imaging of lumbar spine ordered.  She would likely benefit from a course of formal physical therapy and if not improving with this we can consider MRI for interventional planning.

## 2021-04-28 NOTE — Progress Notes (Signed)
Christine Hendrix - 55 y.o. female MRN [id number]  Date of birth: July 03, 1965  Subjective Chief Complaint  Patient presents with   Back Pain    HPI Patient is a 55 year old female here today for follow-up of low back pain.  Overall her back pain has improved since last visit but she continues to have intermittent episodes of pain with radiation into bilateral lower extremities, left greater than right.  She denies weakness in her legs.  She does get numbness and tingling at times.  She has done some home stretches with some improvement.  She does also need renewals of clobetasol spray for psoriasis and hydroxyzine as needed for itching.  ROS:  A comprehensive ROS was completed and negative except as noted per HPI  Allergies  Allergen Reactions   Codeine Nausea And Vomiting, Other (See Comments) and Rash    Vomiting and dizziness   Solifenacin Other (See Comments)    BLURRED VISION Other reaction(s): Hallucinations Visual disturbances   Molds & Smuts Other (See Comments)    Past Medical History:  Diagnosis Date   Arthritis    neck   Atrial fibrillation ([place])    Colitis    chronic    Colitis    Dental crowns present    also dental implant lower right   Family history of colon cancer    Hyperlipidemia    Hypothyroidism    Kidney stone on left side 03/27/2017   Migraines    MVP (mitral valve prolapse)    Nasal turbinate hypertrophy 04/2016   Nephrolithiasis    PONV (postoperative nausea and vomiting)    Psoriasis    TMJ syndrome    limited jaw opening    Past Surgical History:  Procedure Laterality Date   ABDOMINAL HYSTERECTOMY     complete   BUNIONECTOMY WITH HAMMERTOE RECONSTRUCTION Left 03/04/2016   4th/5th toes   CHOLECYSTECTOMY     COLONOSCOPY     More than 5 years. [place]    ESOPHAGOGASTRODUODENOSCOPY     about 5 years ago. [facility] [employer]   TMJ ARTHROPLASTY     TRIGEMINAL NERVE DECOMPRESSION     TURBINATE REDUCTION  Bilateral 05/23/2016   Procedure: BILATERAL TURBINATE REDUCTION;  Surgeon: Leta Baptist, MD;  Location: [place];  Service: ENT;  Laterality: Bilateral;    Social History   Socioeconomic History   Marital status: Married    Spouse name: Not on file   Number of children: Not on file   Years of education: Not on file   Highest education level: Not on file  Occupational History   Occupation: Education officer, [employer] for [employer]    Comment: Education officer, museum  Tobacco Use   Smoking status: Never   Smokeless tobacco: Never  Vaping Use   Vaping Use: Never used  Substance and Sexual Activity   Alcohol use: No    Alcohol/week: 0.0 standard drinks   Drug use: No   Sexual activity: Not on file  Other Topics Concern   Not on file  Social History Narrative   Not on file   Social Determinants of Health   Financial Resource Strain: Not on file  Food Insecurity: Not on file  Transportation Needs: Not on file  Physical Activity: Not on file  Stress: Not on file  Social Connections: Not on file    Family History  Problem Relation Age of Onset   Heart disease Father    Heart failure Father  Colon cancer Father        dx early 15's    Stomach cancer Brother    Colon cancer Brother    Liver cancer Brother    Heart disease Mother    CAD Mother    Esophageal cancer Neg Hx     Health Maintenance  Topic Date Due   HIV Screening  Never done   Hepatitis C Screening  Never done   COVID-19 Vaccine (5 - Booster for Moderna series) 05/12/2021 (Originally 01/27/2021)   Zoster Vaccines- Shingrix (1 of 2) 07/25/2021 (Originally 03/28/2015)   INFLUENZA VACCINE  08/23/2021 (Originally 12/24/2020)   TETANUS/TDAP  04/26/2022 (Originally 05/26/2020)   MAMMOGRAM  08/02/2022   COLONOSCOPY (Pts 45-62yrs Insurance coverage will need to be confirmed)  01/12/2025   Pneumococcal Vaccine 35-96 Years old  Aged Out   HPV VACCINES  Aged Out   PAP SMEAR-Modifier  Discontinued      ----------------------------------------------------------------------------------------------------------------------------------------------------------------------------------------------------------------- Physical Exam BP 138/87 (BP Location: Left Arm, Patient Position: Sitting, Cuff Size: Normal)   Pulse 86   Temp 98.1 F (36.7 C) (Oral)   Ht 5\' 4"  (1.626 m)   Wt 153 lb 1.9 oz (69.5 kg)   SpO2 100%   BMI 26.28 kg/m   Physical Exam Constitutional:      Appearance: Normal appearance.  Eyes:     General: No scleral icterus. Musculoskeletal:     Cervical back: Neck supple.  Neurological:     Mental Status: She is alert.  Psychiatric:        Mood and Affect: Mood normal.        Behavior: Behavior normal.    ------------------------------------------------------------------------------------------------------------------------------------------------------------------------------------------------------------------- Assessment and Plan  Left lumbar radiculopathy Continues to have problems with low back pain with radiculopathy.  She does now have some radiculopathy into the right leg at times.  Imaging of lumbar spine ordered.  She would likely benefit from a course of formal physical therapy and if not improving with this we can consider MRI for interventional planning.  Psoriasis Clobetasol spray renewed.  Hydroxyzine renewed to use as needed for itching.   Meds ordered this encounter  Medications   Clobetasol Propionate (TEMOVATE) 0.05 % external spray    Sig: Apply topically 2 (two) times daily.    Dispense:  125 mL    Refill:  1   hydrOXYzine (ATARAX) 25 MG tablet    Sig: Take 1 tablet (25 mg total) by mouth every 6 (six) hours as needed for itching.    Dispense:  30 tablet    Refill:  1    No follow-ups on file.    This visit occurred during the SARS-CoV-2 public health emergency.  Safety protocols were in place, including screening questions prior to  the visit, additional usage of staff PPE, and extensive cleaning of exam room while observing appropriate contact time as indicated for disinfecting solutions.  A household with placer [place] she is to

## 2021-04-30 ENCOUNTER — Encounter: Payer: Self-pay | Admitting: Family Medicine

## 2021-04-30 ENCOUNTER — Other Ambulatory Visit: Payer: Self-pay

## 2021-04-30 ENCOUNTER — Ambulatory Visit (INDEPENDENT_AMBULATORY_CARE_PROVIDER_SITE_OTHER): Payer: BC Managed Care – PPO

## 2021-04-30 ENCOUNTER — Telehealth: Payer: Self-pay

## 2021-04-30 DIAGNOSIS — G8929 Other chronic pain: Secondary | ICD-10-CM

## 2021-04-30 DIAGNOSIS — M545 Low back pain, unspecified: Secondary | ICD-10-CM | POA: Diagnosis not present

## 2021-04-30 DIAGNOSIS — M5442 Lumbago with sciatica, left side: Secondary | ICD-10-CM

## 2021-04-30 NOTE — Telephone Encounter (Signed)
Medication: Clobetasol Propionate (TEMOVATE) 0.05 % external spray Prior authorization determination received Medication has been approved Approval dates: 04/30/2021-04/30/2022  Patient aware via: MyChart Pharmacy aware: Yes Provider aware via this encounter

## 2021-04-30 NOTE — Telephone Encounter (Signed)
Medication: Clobetasol Propionate (TEMOVATE) 0.05 % external spray Prior authorization submitted via CoverMyMeds on 04/30/2021 PA submission pending

## 2021-05-07 DIAGNOSIS — L821 Other seborrheic keratosis: Secondary | ICD-10-CM | POA: Diagnosis not present

## 2021-05-07 DIAGNOSIS — L0201 Cutaneous abscess of face: Secondary | ICD-10-CM | POA: Diagnosis not present

## 2021-05-07 DIAGNOSIS — L603 Nail dystrophy: Secondary | ICD-10-CM | POA: Diagnosis not present

## 2021-05-07 DIAGNOSIS — L400 Psoriasis vulgaris: Secondary | ICD-10-CM | POA: Diagnosis not present

## 2021-05-07 DIAGNOSIS — L089 Local infection of the skin and subcutaneous tissue, unspecified: Secondary | ICD-10-CM | POA: Diagnosis not present

## 2021-05-11 ENCOUNTER — Other Ambulatory Visit: Payer: Self-pay | Admitting: Gastroenterology

## 2021-05-15 ENCOUNTER — Encounter (HOSPITAL_COMMUNITY): Payer: Self-pay | Admitting: Gastroenterology

## 2021-05-15 ENCOUNTER — Telehealth: Payer: Self-pay | Admitting: Gastroenterology

## 2021-05-15 NOTE — Telephone Encounter (Signed)
I am not sure what the answer is.  Please call the [facility] unit and ask them what they think.  I suspect it will be okay for her to have it done though I am not sure.

## 2021-05-15 NOTE — Telephone Encounter (Signed)
Patient called and stated that she has Mersa on her cheek. She has a procedure on 1/4 at [facility]. Wanting to know if she needs to reschedule. Please advise.

## 2021-05-15 NOTE — Telephone Encounter (Signed)
Returned patient's call. Pt states she has a spot on her left cheek that she had cultured and it came back that she had MRSA. She started doxycyline today and will be on it for 10 days. Patient also states that she had MRSA in her right nostril 1 year ago. She was instructed to put Bactroban in her nose for the MRSA. Pt states that she currently has a nonhealing spot in her nose from where the doctors cauterized her nose because of severe nose bleeds. She has an appointment with ENT on 05/22/21. She continues to put Bactroban on the spot in her R nostril but it hasn't gone away. Patient is concerned that she may have MRSA in her nose as well. Patient calling today to see if she will need to cancel her appointment for Esophageal Manometry at [facility] with Dr. Barron Alvine or if it is alright to keep this appointment.  Please advise.

## 2021-05-15 NOTE — Telephone Encounter (Signed)
Spoke with Debbie at [facility] and she said to contact Dr. Lavon Paganini to see if it would be alright. Debbie recommended that patient keep appt on 12/28 with ENT to get their opinion. Called patient to update her and to make sure she was planning on keeping appt with ENT. Pt confirmed she was keeping appt on 12/28. Thanked pt for calling and told pt we would get back to her in a few days. Dr. Lavon Paganini please see notes below and advise. Pt is scheduled for esophageal manometry on 05/29/21.

## 2021-05-22 ENCOUNTER — Telehealth: Payer: Self-pay | Admitting: Gastroenterology

## 2021-05-22 DIAGNOSIS — R0981 Nasal congestion: Secondary | ICD-10-CM | POA: Diagnosis not present

## 2021-05-22 DIAGNOSIS — R42 Dizziness and giddiness: Secondary | ICD-10-CM | POA: Diagnosis not present

## 2021-05-22 DIAGNOSIS — J301 Allergic rhinitis due to pollen: Secondary | ICD-10-CM | POA: Diagnosis not present

## 2021-05-22 DIAGNOSIS — K219 Gastro-esophageal reflux disease without esophagitis: Secondary | ICD-10-CM

## 2021-05-22 DIAGNOSIS — Z22322 Carrier or suspected carrier of Methicillin resistant Staphylococcus aureus: Secondary | ICD-10-CM | POA: Diagnosis not present

## 2021-05-22 DIAGNOSIS — R1310 Dysphagia, unspecified: Secondary | ICD-10-CM

## 2021-05-22 DIAGNOSIS — R053 Chronic cough: Secondary | ICD-10-CM | POA: Diagnosis not present

## 2021-05-22 NOTE — Telephone Encounter (Signed)
Patient called and wants to cancel hospital procedure for at least a month. Please call to reschedule.  Thank you.

## 2021-05-22 NOTE — Telephone Encounter (Signed)
Pt's esophageal manometry/pH study has been rescheduled to Wednesday, 08/14/21 at 12:30 pm. Pt request an appt in March/April. Pt is aware that I will send her updated instructions in the mail. Pt verbalized understanding and had no concerns at the end of the call.  Ambulatory referral to GI in epic.

## 2021-05-23 ENCOUNTER — Encounter: Payer: Self-pay | Admitting: Family Medicine

## 2021-05-23 DIAGNOSIS — M5416 Radiculopathy, lumbar region: Secondary | ICD-10-CM

## 2021-05-24 MED ORDER — METHOCARBAMOL 500 MG PO TABS: 500.0000 mg | ORAL_TABLET | Freq: Four times a day (QID) | ORAL | 0 refills | Status: DC | PRN

## 2021-05-31 ENCOUNTER — Ambulatory Visit: Payer: BC Managed Care – PPO | Admitting: Family Medicine

## 2021-06-04 DIAGNOSIS — G43709 Chronic migraine without aura, not intractable, without status migrainosus: Secondary | ICD-10-CM | POA: Diagnosis not present

## 2021-06-08 ENCOUNTER — Ambulatory Visit (INDEPENDENT_AMBULATORY_CARE_PROVIDER_SITE_OTHER): Payer: BC Managed Care – PPO

## 2021-06-08 ENCOUNTER — Other Ambulatory Visit: Payer: Self-pay

## 2021-06-08 DIAGNOSIS — M5136 Other intervertebral disc degeneration, lumbar region: Secondary | ICD-10-CM | POA: Diagnosis not present

## 2021-06-08 DIAGNOSIS — M5416 Radiculopathy, lumbar region: Secondary | ICD-10-CM | POA: Diagnosis not present

## 2021-06-10 ENCOUNTER — Encounter: Payer: Self-pay | Admitting: Family Medicine

## 2021-06-11 ENCOUNTER — Other Ambulatory Visit: Payer: Self-pay | Admitting: Family Medicine

## 2021-06-11 DIAGNOSIS — M5416 Radiculopathy, lumbar region: Secondary | ICD-10-CM

## 2021-06-11 MED ORDER — PREDNISONE 10 MG (48) PO TBPK: ORAL_TABLET | ORAL | 0 refills | Status: DC

## 2021-07-19 ENCOUNTER — Telehealth: Payer: Self-pay | Admitting: Gastroenterology

## 2021-07-19 NOTE — Telephone Encounter (Signed)
Patient called and stated that she needs to reschedule her procedure on 3/22 at [facility] due to insurance reasons and would like to be rescheduled after April 3rd. Please advise.

## 2021-07-22 NOTE — Telephone Encounter (Signed)
Rescheduled pt's esophageal manometry. Called and spoke with pt and let her know she is scheduled for 10/30/21 at 10:30 am. Pt verbalized understanding. Updated instructions sent to pt's mychart and mailed.

## 2021-07-26 DIAGNOSIS — I4891 Unspecified atrial fibrillation: Secondary | ICD-10-CM | POA: Diagnosis not present

## 2021-07-26 DIAGNOSIS — I341 Nonrheumatic mitral (valve) prolapse: Secondary | ICD-10-CM | POA: Diagnosis not present

## 2021-07-26 DIAGNOSIS — R0600 Dyspnea, unspecified: Secondary | ICD-10-CM | POA: Diagnosis not present

## 2021-08-19 ENCOUNTER — Other Ambulatory Visit: Payer: Self-pay | Admitting: Family Medicine

## 2021-09-12 ENCOUNTER — Emergency Department (INDEPENDENT_AMBULATORY_CARE_PROVIDER_SITE_OTHER)
Admission: RE | Admit: 2021-09-12 | Discharge: 2021-09-12 | Disposition: A | Discharge status: Home / Self Care | Payer: BC Managed Care – PPO | Source: Ambulatory Visit | Attending: Family Medicine | Admitting: Family Medicine

## 2021-09-12 VITALS — BP 144/82 | HR 83 | Temp 97.9°F | Resp 18

## 2021-09-12 DIAGNOSIS — H6981 Other specified disorders of Eustachian tube, right ear: Secondary | ICD-10-CM

## 2021-09-12 DIAGNOSIS — H9201 Otalgia, right ear: Secondary | ICD-10-CM | POA: Diagnosis not present

## 2021-09-12 MED ORDER — PREDNISONE 20 MG PO TABS: 40.0000 mg | ORAL_TABLET | Freq: Every day | ORAL | 0 refills | Status: DC

## 2021-09-12 NOTE — ED Provider Notes (Signed)
?[facility] ? ? ? ?CSN: [id number] ?Arrival date & time: 09/12/21  1646 ? ? ?  ? ?History   ?Chief Complaint ?Chief Complaint  ?Patient presents with  ? Ear Fullness  ?  Ear pain for the last week or so - Entered by patient  ? Otalgia  ? ? ?HPI ?Christine Hendrix is a 56 y.o. female.  ? ?HPI ? ?Patient states she has intermittent ear fullness and ear pain on the right off and on for about 9 days.  This morning the ear pain was severe enough to make her feel like she wanted to cry.  Her hearing is normal.  No runny nose or sore throat.  She does have some allergy symptoms. ? ?Past Medical History:  ?Diagnosis Date  ? Arthritis   ? neck  ? Atrial fibrillation (HCC)   ? Colitis   ? chronic   ? Colitis   ? Dental crowns present   ? also dental implant lower right  ? Family history of colon cancer   ? Hyperlipidemia   ? Hypothyroidism   ? Kidney stone on left side 03/27/2017  ? Migraines   ? MVP (mitral valve prolapse)   ? Nasal turbinate hypertrophy 04/2016  ? Nephrolithiasis   ? PONV (postoperative nausea and vomiting)   ? Psoriasis   ? TMJ syndrome   ? limited jaw opening  ? ? ?Patient Active Problem List  ? Diagnosis Date Noted  ? Psoriasis 04/28/2021  ? Left lumbar radiculopathy 03/17/2021  ? Abnormal weight gain 03/03/2021  ? Nasal septum ulceration 03/03/2021  ? Surgical menopause on hormone replacement therapy 10/21/2019  ? Right lower quadrant abdominal pain 04/18/2019  ? Proteinuria 02/21/2019  ? History of abnormal cervical Pap smear 01/14/2019  ? Epistaxis 06/11/2018  ? Nasal sinus congestion 05/13/2018  ? Snoring 05/13/2018  ? PAF (paroxysmal atrial fibrillation) (HCC) 02/26/2018  ? H/O: hysterectomy 12/04/2017  ? Women's annual routine gynecological examination 12/04/2017  ? Family history of colon cancer in father 09/18/2017  ? Postmenopausal HRT (hormone replacement therapy) 10/24/2016  ? Allergic rhinitis 10/08/2016  ? 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 Mixed incontinence 05/06/2015  ?  Metatarsal deformity 08/25/2014  ? Acquired hammer toes of both feet 08/25/2014  ? Intractable chronic migraine without aura and with status migrainosus 02/24/2014  ? Hypothyroidism 09/02/2013  ? Neck pain 09/02/2013  ? Restless legs syndrome 09/02/2013  ? Insomnia 09/02/2013  ? Nonrheumatic mitral valve prolapse 05/30/2011  ? ? ?Past Surgical History:  ?Procedure Laterality Date  ? ABDOMINAL HYSTERECTOMY    ? complete  ? BUNIONECTOMY WITH HAMMERTOE RECONSTRUCTION Left 03/04/2016  ? 4th/5th toes  ? CHOLECYSTECTOMY    ? COLON[facility]    ? [facility] [address]   ? ESOPHAGOGASTRODUODENOSCOPY    ? [facility] [address]  ? TMJ ARTHROPLASTY    ? TRIGEMINAL NERVE DECOMPRESSION    ? TURBINATE REDUCTION Bilateral 05/23/2016  ? Procedure: BILATERAL TURBINATE REDUCTION;  Surgeon: Newman PiesSu Teoh, MD;  Location:  [facility];  Service: ENT;  Laterality: Bilateral;  ? ? ?OB History   ?No obstetric history on file. ?  ? ? ? ?Home Medications   ? ?Prior to Admission medications   ?Medication Sig Start Date End Date Taking? Authorizing Provider  ?predniSONE (DELTASONE) 20 MG tablet Take 2 tablets (40 mg total) by mouth daily with breakfast. 09/12/21  Yes Eustace MooreNelson, Yliana Gravois Sue, MD  ?amitriptyline (ELAVIL) 25  MG tablet Take 25 mg by mouth at bedtime. 03/04/21   [provider]  ?aspirin EC 81 MG tablet Take 1 tablet (81 mg total) by mouth daily. 03/24/18   Lewayne Bunting, MD  ?Clobetasol Propionate (TEMOVATE) 0.05 % external spray Apply topically 2 (two) times daily. 04/26/21   Everrett Coombe, DO  ?diltiazem (CARDIZEM CD) 120 MG 24 hr capsule Take 1 capsule (120 mg total) by mouth daily. 09/22/18 03/24/21  Lewayne Bunting, MD  ?EPINEPHrine 0.3 mg/0.3 mL IJ SOAJ injection SMARTSIG:0.3 Milliliter(s) IM Once PRN 01/26/21   [provider]  ?estrogens, conjugated, (PREMARIN) 1.25 MG tablet Take 1 tablet (1.25 mg total) by mouth daily. 02/18/21   Everrett Coombe,  DO  ?famotidine (PEPCID) 20 MG tablet TAKE 1 TABLET BY MOUTH EVERYDAY AT BEDTIME 05/13/21   Cirigliano, Vito V, DO  ?ibuprofen (ADVIL) 800 MG tablet Take 1 tablet (800 mg total) by mouth every 8 (eight) hours as needed. 12/11/20   Everrett Coombe, DO  ?levothyroxine (SYNTHROID) 75 MCG tablet TAKE 1 TABLET DAILY BEFORE BREAKFAST 04/22/21   Everrett Coombe, DO  ?meclizine (ANTIVERT) 25 MG tablet Take 1 tablet (25 mg total) by mouth 3 (three) times daily as needed for dizziness. 02/20/21   Everrett Coombe, DO  ?NURTEC 75 MG TBDP Take 1 tablet by mouth every other day as needed. 03/04/21   [provider]  ?ondansetron (ZOFRAN-ODT) 8 MG disintegrating tablet Take 1 tablet (8 mg total) by mouth every 8 (eight) hours as needed for nausea or vomiting. 01/04/21   Everrett Coombe, DO  ?pantoprazole (PROTONIX) 40 MG tablet TAKE 1 TABLET BY MOUTH EVERY DAY 02/20/21   Cirigliano, Vito V, DO  ?SUMAtriptan (IMITREX) 100 MG tablet TAKE 1 TABLET EVERY 2 HOURS AS NEEDED FOR MIGRAINE, MAY REPEAT IN 2 HOURS IF HEADACHE PERSISTS OR RECURS 08/19/21   Everrett Coombe, DO  ?zonisamide (ZONEGRAN) 25 MG capsule Take by mouth. 03/04/21   [provider]  ? ? ?Family History ?Family History  ?Problem Relation Age of Onset  ? Heart disease Father   ? Heart failure Father   ? Colon cancer Father   ?     dx early 58's   ? Stomach cancer Brother   ? Colon cancer Brother   ? Liver cancer Brother   ? Heart disease Mother   ? CAD Mother   ? Esophageal cancer Neg Hx   ? ? ?Social History ?Social History  ? ?Tobacco Use  ? Smoking status: Never  ? Smokeless tobacco: Never  ?Vaping Use  ? Vaping Use: Never used  ?Substance Use Topics  ? Alcohol use: No  ?  Alcohol/week: 0.0 standard drinks  ? Drug use: No  ? ? ? ?Allergies   ?Codeine, Solifenacin, and Molds & smuts ? ? ?Review of Systems ?Review of Systems ? ?See HPI ?Physical Exam ?Triage Vital Signs ?ED Triage Vitals  ?Enc Vitals Group  ?   BP 09/12/21 1655 (!) 144/82  ?   Pulse Rate 09/12/21  1655 83  ?   Resp 09/12/21 1655 18  ?   Temp 09/12/21 1655 97.9 ?F (36.6 ?C)  ?   Temp Source 09/12/21 1655 Oral  ?   SpO2 09/12/21 1655 97 %  ?   Weight --   ?   Height --   ?   Head Circumference --   ?   Peak Flow --   ?   Pain Score 09/12/21 1657 6  ?   Pain  Loc --   ?   Pain Edu? --   ?   Excl. in GC? --   ? ?No data found. ? ?Updated Vital Signs ?BP (!) 144/82 (BP Location: Right Arm)   Pulse 83   Temp 97.9 ?F (36.6 ?C) (Oral)   Resp 18   SpO2 97%  ?   ? ?Physical Exam ?Constitutional:   ?   General: She is not in acute distress. ?   Appearance: She is well-developed and normal weight.  ?HENT:  ?   Head: Normocephalic and atraumatic.  ?   Right Ear: Ear canal and external ear normal.  ?   Left Ear: Tympanic membrane and external ear normal.  ?   Ears:  ?   Comments: TM is dull ?   Nose: No congestion.  ?   Mouth/Throat:  ?   Pharynx: No posterior oropharyngeal erythema.  ?Eyes:  ?   Conjunctiva/sclera: Conjunctivae normal.  ?   Pupils: Pupils are equal, round, and reactive to light.  ?Cardiovascular:  ?   Rate and Rhythm: Normal rate.  ?Pulmonary:  ?   Effort: Pulmonary effort is normal. No respiratory distress.  ?Abdominal:  ?   General: There is no distension.  ?   Palpations: Abdomen is soft.  ?Musculoskeletal:     ?   General: Normal range of motion.  ?   Cervical back: Normal range of motion.  ?Lymphadenopathy:  ?   Cervical: No cervical adenopathy.  ?Skin: ?   General: Skin is warm and dry.  ?Neurological:  ?   Mental Status: She is alert.  ? ? ? ?UC Treatments / Results  ?Labs ?(all labs ordered are listed, but only abnormal results are displayed) ?Labs Reviewed - No data to display ? ?EKG ? ? ?Radiology ?No results found. ? ?Procedures ?Procedures (including critical care time) ? ?Medications Ordered in UC ?Medications - No data to display ? ?Initial Impression / Assessment and Plan / UC Course  ?I have reviewed the triage vital signs and the nursing notes. ? ?Pertinent labs & imaging results that  were available during my care of the patient were reviewed by me and considered in my medical decision making (see chart for details). ? ?  ? ?She does not have a bacterial otitis that requires antibiotics.  She

## 2021-09-12 NOTE — Discharge Instructions (Signed)
Continue to push fluids ?Use the nasal steroid daily ?Take the prednisone for 5 days ?

## 2021-09-12 NOTE — ED Triage Notes (Signed)
Pt states she has had right ear pain for the last week that comes and goes.  ?

## 2021-09-13 ENCOUNTER — Telehealth: Payer: Self-pay

## 2021-09-13 NOTE — Telephone Encounter (Signed)
We will contact patient to find out what nasal spray she is requesting. ?

## 2021-09-18 ENCOUNTER — Ambulatory Visit: Payer: BC Managed Care – PPO | Admitting: Physician Assistant

## 2021-10-02 DIAGNOSIS — H04552 Acquired stenosis of left nasolacrimal duct: Secondary | ICD-10-CM | POA: Diagnosis not present

## 2021-10-23 ENCOUNTER — Encounter (HOSPITAL_COMMUNITY): Payer: Self-pay | Admitting: Gastroenterology

## 2021-10-23 NOTE — Progress Notes (Signed)
Attempted to obtain medical history via telephone, unable to reach at this time. HIPAA compliant voicemail message left requesting return call to pre surgical testing department. 

## 2021-10-30 ENCOUNTER — Ambulatory Visit (HOSPITAL_COMMUNITY)
Admission: RE | Admit: 2021-10-30 | Discharge: 2021-10-30 | Disposition: A | Discharge status: Home / Self Care | Payer: BC Managed Care – PPO | Source: Ambulatory Visit | Attending: Gastroenterology | Admitting: Gastroenterology

## 2021-10-30 ENCOUNTER — Encounter (HOSPITAL_COMMUNITY)
Admission: RE | Disposition: A | Discharge status: Home / Self Care | Payer: Self-pay | Source: Ambulatory Visit | Attending: Gastroenterology

## 2021-10-30 ENCOUNTER — Encounter (HOSPITAL_COMMUNITY): Payer: Self-pay | Admitting: Gastroenterology

## 2021-10-30 DIAGNOSIS — R112 Nausea with vomiting, unspecified: Secondary | ICD-10-CM | POA: Insufficient documentation

## 2021-10-30 DIAGNOSIS — K219 Gastro-esophageal reflux disease without esophagitis: Secondary | ICD-10-CM | POA: Diagnosis not present

## 2021-10-30 DIAGNOSIS — R1310 Dysphagia, unspecified: Secondary | ICD-10-CM

## 2021-10-30 DIAGNOSIS — R12 Heartburn: Secondary | ICD-10-CM

## 2021-10-30 DIAGNOSIS — R1110 Vomiting, unspecified: Secondary | ICD-10-CM

## 2021-10-30 DIAGNOSIS — K224 Dyskinesia of esophagus: Secondary | ICD-10-CM | POA: Diagnosis not present

## 2021-10-30 HISTORY — PX: 24 HOUR PH STUDY: SHX5419

## 2021-10-30 HISTORY — PX: ESOPHAGEAL MANOMETRY: SHX5429

## 2021-10-30 SURGERY — MANOMETRY, ESOPHAGUS

## 2021-10-30 MED ORDER — LIDOCAINE VISCOUS HCL 2 % MT SOLN
OROMUCOSAL | Status: AC
  Filled 2021-10-30: qty 15

## 2021-10-30 SURGICAL SUPPLY — 2 items
FACESHIELD LNG OPTICON STERILE (SAFETY) IMPLANT
GLOVE BIO SURGEON STRL SZ8 (GLOVE) ×4 IMPLANT

## 2021-10-30 NOTE — Progress Notes (Signed)
Esophageal manometry performed per protocol without complications.  Patient tolerated well.  pH probed placed per protocol without complications.  Education given on diary and monitor.  Patient verbalized understanding and will return tomorrow to have pH probe removed.

## 2021-10-31 ENCOUNTER — Encounter (HOSPITAL_COMMUNITY): Payer: Self-pay | Admitting: Gastroenterology

## 2021-11-05 DIAGNOSIS — R1310 Dysphagia, unspecified: Secondary | ICD-10-CM

## 2021-11-05 DIAGNOSIS — K219 Gastro-esophageal reflux disease without esophagitis: Secondary | ICD-10-CM

## 2021-11-05 DIAGNOSIS — R1110 Vomiting, unspecified: Secondary | ICD-10-CM

## 2021-11-05 DIAGNOSIS — R12 Heartburn: Secondary | ICD-10-CM

## 2021-11-08 DIAGNOSIS — H04202 Unspecified epiphora, left lacrimal gland: Secondary | ICD-10-CM | POA: Diagnosis not present

## 2021-11-15 ENCOUNTER — Other Ambulatory Visit: Payer: Self-pay | Admitting: Gastroenterology

## 2021-11-18 ENCOUNTER — Encounter: Payer: Self-pay | Admitting: Gastroenterology

## 2021-11-20 NOTE — Telephone Encounter (Signed)
Esophageal manometry and pH/impedance results reviewed.  Please see separate note forwarded regarding results and recommendations.

## 2021-11-21 DIAGNOSIS — H938X1 Other specified disorders of right ear: Secondary | ICD-10-CM | POA: Diagnosis not present

## 2021-11-22 DIAGNOSIS — H938X1 Other specified disorders of right ear: Secondary | ICD-10-CM | POA: Diagnosis not present

## 2021-11-23 ENCOUNTER — Other Ambulatory Visit: Payer: Self-pay

## 2021-11-23 ENCOUNTER — Emergency Department (HOSPITAL_BASED_OUTPATIENT_CLINIC_OR_DEPARTMENT_OTHER)
Admission: EM | Admit: 2021-11-23 | Discharge: 2021-11-23 | Disposition: A | Discharge status: Home / Self Care | Payer: BC Managed Care – PPO | Attending: Emergency Medicine | Admitting: Emergency Medicine

## 2021-11-23 ENCOUNTER — Encounter (HOSPITAL_BASED_OUTPATIENT_CLINIC_OR_DEPARTMENT_OTHER): Payer: Self-pay | Admitting: Emergency Medicine

## 2021-11-23 DIAGNOSIS — Z7982 Long term (current) use of aspirin: Secondary | ICD-10-CM | POA: Diagnosis not present

## 2021-11-23 DIAGNOSIS — Z79899 Other long term (current) drug therapy: Secondary | ICD-10-CM | POA: Insufficient documentation

## 2021-11-23 DIAGNOSIS — H5789 Other specified disorders of eye and adnexa: Secondary | ICD-10-CM | POA: Insufficient documentation

## 2021-11-23 DIAGNOSIS — H0289 Other specified disorders of eyelid: Secondary | ICD-10-CM | POA: Diagnosis not present

## 2021-11-23 MED ORDER — OFLOXACIN 0.3 % OP SOLN
1.0000 [drp] | Freq: Once | OPHTHALMIC | Status: AC
  Administered 2021-11-23: 1 [drp] via OPHTHALMIC
  Filled 2021-11-23: qty 5

## 2021-11-23 NOTE — ED Triage Notes (Signed)
Pt arrives pov, steady gait, c/o left eye pain and discharge

## 2021-11-23 NOTE — Discharge Instructions (Signed)
Use ofloxacin eyedrops 1 drop 4 times a day.  Call Monday morning to schedule close follow-up with your oculoplastic specialist.  If you develop significant redness or swelling around the eye, fevers or change in vision return for reevaluation.

## 2021-11-23 NOTE — ED Notes (Signed)
Large chunk of yellow discharge suddenly popped out of spot in eye, cleared away with q tip and pt states more relieved.

## 2021-11-25 DIAGNOSIS — H00021 Hordeolum internum right upper eyelid: Secondary | ICD-10-CM | POA: Diagnosis not present

## 2021-12-04 ENCOUNTER — Other Ambulatory Visit: Payer: Self-pay | Admitting: Family Medicine

## 2021-12-04 DIAGNOSIS — E663 Overweight: Secondary | ICD-10-CM | POA: Diagnosis not present

## 2021-12-04 DIAGNOSIS — G4700 Insomnia, unspecified: Secondary | ICD-10-CM | POA: Diagnosis not present

## 2021-12-04 DIAGNOSIS — G43709 Chronic migraine without aura, not intractable, without status migrainosus: Secondary | ICD-10-CM | POA: Diagnosis not present

## 2021-12-05 ENCOUNTER — Encounter: Payer: Self-pay | Admitting: Family Medicine

## 2021-12-06 NOTE — ED Provider Notes (Signed)
[facility] EMERGENCY DEPARTMENT Provider Note   CSN: [id number] Arrival date & time: 11/23/21  1849     History  Chief Complaint  Patient presents with   Eye Problem    Christine Hendrix is a 56 y.o. female.  Christine Hendrix is a 56 y.o. female with history of MVP, migraines, hyperlipidemia, TMJ syndrome, A-fib, who presents to the emergency department for evaluation of left eye pain and drainage.  Patient reports for several months she has been having issues with intermittent pain in the left eye and eyelid and often seeing discharge in the eye, she has been followed by an ophthalmologist as well as a oculoplastic specialist without any answers thus far.  She reports earlier today she started having increasing pain and swelling primarily over the inner canthus and the inner corner of the upper lid, she was looking at the area and massaging it and then a large chunk of yellow discharge popped out.  She reports she thinks it came from the puncta at the inner corner of the upper eyelid.  After that swelling and pain seem to be resolved but then it seems like it reaccumulated in the area and when she arrived to the emergency department she again was able to express a large chunk of yellow discharge from the same spot.  No changes to her vision, but patient is very anxious about this issue.  No pain or swelling reported over the lacrimal duct.  Does not wear contacts or glasses.  The history is provided by the patient.  Eye Problem Associated symptoms: discharge   Associated symptoms: no photophobia        Home Medications Prior to Admission medications   Medication Sig Start Date End Date Taking? Authorizing Provider  amitriptyline (ELAVIL) 25 MG tablet Take 25 mg by mouth at bedtime. 03/04/21   [provider]  aspirin EC 81 MG tablet Take 1 tablet (81 mg total) by mouth daily. 03/24/18   Lewayne Bunting, MD  Clobetasol Propionate (TEMOVATE) 0.05 % external spray Apply  topically 2 (two) times daily. 04/26/21   Everrett Coombe, DO  diltiazem (CARDIZEM CD) 120 MG 24 hr capsule Take 1 capsule (120 mg total) by mouth daily. 09/22/18 03/24/21  Lewayne Bunting, MD  EPINEPHrine 0.3 mg/0.3 mL IJ SOAJ injection SMARTSIG:0.3 Milliliter(s) IM Once PRN 01/26/21   [provider]  famotidine (PEPCID) 20 MG tablet TAKE 1 TABLET BY MOUTH EVERYDAY AT BEDTIME 11/15/21   Cirigliano, Vito V, DO  ibuprofen (ADVIL) 800 MG tablet Take 1 tablet (800 mg total) by mouth every 8 (eight) hours as needed. 12/11/20   Everrett Coombe, DO  levothyroxine (SYNTHROID) 75 MCG tablet TAKE 1 TABLET DAILY BEFORE BREAKFAST 04/22/21   Everrett Coombe, DO  meclizine (ANTIVERT) 25 MG tablet Take 1 tablet (25 mg total) by mouth 3 (three) times daily as needed for dizziness. 02/20/21   Everrett Coombe, DO  NURTEC 75 MG TBDP Take 1 tablet by mouth every other day as needed. 03/04/21   [provider]  ondansetron (ZOFRAN-ODT) 8 MG disintegrating tablet Take 1 tablet (8 mg total) by mouth every 8 (eight) hours as needed for nausea or vomiting. 01/04/21   Everrett Coombe, DO  pantoprazole (PROTONIX) 40 MG tablet TAKE 1 TABLET BY MOUTH EVERY DAY 02/20/21   Cirigliano, Vito V, DO  predniSONE (DELTASONE) 20 MG tablet Take 2 tablets (40 mg total) by mouth daily with breakfast. 09/12/21   Eustace Moore, MD  PREMARIN 1.25 MG  tablet TAKE 1 TABLET DAILY 12/05/21   Everrett Coombe, DO  SUMAtriptan (IMITREX) 100 MG tablet TAKE 1 TABLET EVERY 2 HOURS AS NEEDED FOR MIGRAINE, MAY REPEAT IN 2 HOURS IF HEADACHE PERSISTS OR RECURS 08/19/21   Everrett Coombe, DO  zonisamide (ZONEGRAN) 25 MG capsule Take by mouth. 03/04/21   [provider]      Allergies    Codeine, Solifenacin, and Molds & smuts    Review of Systems   Review of Systems  Constitutional:  Negative for chills and fever.  Eyes:  Positive for pain and discharge. Negative for photophobia and visual disturbance.    Physical Exam Updated Vital  Signs BP (!) 160/94 (BP Location: Left Arm)   Pulse 85   Temp 98 F (36.7 C) (Oral)   Resp 18   Ht 5\' 4"  (1.626 m)   Wt 68 kg   SpO2 99%   BMI 25.75 kg/m  Physical Exam Vitals and nursing note reviewed.  Constitutional:      General: She is not in acute distress.    Appearance: Normal appearance. She is well-developed. She is not ill-appearing or diaphoretic.  HENT:     Head: Normocephalic and atraumatic.  Eyes:     General:        Right eye: No discharge.        Left eye: No discharge.     Comments: Left eye currently without any drainage, no swelling or tenderness over the eyelids, inner canthus or lacrimal duct and no current expressible drainage.  No erythema of the sclera or conjunctival, no tearing, PERRLA, EOMI.  No proptosis or periorbital swelling.  Right eye unremarkable  Pulmonary:     Effort: Pulmonary effort is normal. No respiratory distress.  Neurological:     Mental Status: She is alert and oriented to person, place, and time.     Coordination: Coordination normal.  Psychiatric:        Mood and Affect: Mood normal.        Behavior: Behavior normal.     ED Results / Procedures / Treatments   Labs (all labs ordered are listed, but only abnormal results are displayed) Labs Reviewed - No data to display  EKG None  Radiology No results found.  Procedures Procedures    Medications Ordered in ED Medications  ofloxacin (OCUFLOX) 0.3 % ophthalmic solution 1 drop (1 drop Left Eye Given 11/23/21 1956)    ED Course/ Medical Decision Making/ A&P                           Medical Decision Making Risk Prescription drug management.   56 year old female presents with 2 episodes of significant pain and swelling at the inner canthus of the eye followed by expression of all of large chunk of yellow purulent drainage from the puncta of the inner upper left eyelid.  Concern for potential dacryocystitis or obstruction or stenosis of the duct, no current tenderness  or swelling over the lacrimal duct, also considered a stye.  The eye itself does not appear to be infected and patient has no change in vision.  I discussed case with Dr. 59 with ophthalmology, recommends that patient follow back up with oculoplastics provider that she has been referred to.  In the meantime can treat with ofloxacin drops, but no further emergent evaluation indicated.  Provided patient with reassurance and given ofloxacin drops.  Discharged home in good condition.  Final Clinical Impression(s) / ED Diagnoses Final diagnoses:  Eye drainage    Rx / DC Orders ED Discharge Orders     None         Legrand Rams 12/06/21 1009    Alvira Monday, MD 12/07/21 [phone]

## 2021-12-25 ENCOUNTER — Other Ambulatory Visit: Payer: Self-pay

## 2021-12-25 MED ORDER — CLOBETASOL PROPIONATE 0.05 % EX LIQD: Freq: Two times a day (BID) | CUTANEOUS | 1 refills | Status: DC

## 2021-12-25 NOTE — Telephone Encounter (Signed)
Please contact patient to schedule appt. Thanks

## 2021-12-26 ENCOUNTER — Other Ambulatory Visit: Payer: Self-pay

## 2021-12-26 MED ORDER — PANTOPRAZOLE SODIUM 40 MG PO TBEC: 40.0000 mg | DELAYED_RELEASE_TABLET | Freq: Every day | ORAL | 2 refills | Status: DC

## 2021-12-26 NOTE — Telephone Encounter (Signed)
Pantoprazole 40 mg sent to [facility]. Patient made aware.

## 2022-02-02 ENCOUNTER — Other Ambulatory Visit: Payer: Self-pay | Admitting: Family Medicine

## 2022-02-07 ENCOUNTER — Encounter: Payer: Self-pay | Admitting: Family Medicine

## 2022-02-10 ENCOUNTER — Telehealth: Payer: Self-pay

## 2022-02-10 ENCOUNTER — Other Ambulatory Visit: Payer: Self-pay | Admitting: Family Medicine

## 2022-02-10 DIAGNOSIS — R928 Other abnormal and inconclusive findings on diagnostic imaging of breast: Secondary | ICD-10-CM

## 2022-02-10 MED ORDER — ONDANSETRON 8 MG PO TBDP: 8.0000 mg | ORAL_TABLET | Freq: Three times a day (TID) | ORAL | 1 refills | Status: DC | PRN

## 2022-02-10 NOTE — Telephone Encounter (Signed)
Pt lvm requesting Diagnostic Mammogram ordered at [facility]. There was a left breast US

## 2022-02-13 ENCOUNTER — Telehealth: Payer: Self-pay

## 2022-02-13 DIAGNOSIS — E663 Overweight: Secondary | ICD-10-CM | POA: Diagnosis not present

## 2022-02-13 DIAGNOSIS — G43709 Chronic migraine without aura, not intractable, without status migrainosus: Secondary | ICD-10-CM | POA: Diagnosis not present

## 2022-02-13 NOTE — Telephone Encounter (Signed)
error 

## 2022-02-18 DIAGNOSIS — E663 Overweight: Secondary | ICD-10-CM | POA: Diagnosis not present

## 2022-02-18 DIAGNOSIS — Z7182 Exercise counseling: Secondary | ICD-10-CM | POA: Diagnosis not present

## 2022-02-18 DIAGNOSIS — Z13228 Encounter for screening for other metabolic disorders: Secondary | ICD-10-CM | POA: Diagnosis not present

## 2022-02-18 DIAGNOSIS — Z713 Dietary counseling and surveillance: Secondary | ICD-10-CM | POA: Diagnosis not present

## 2022-02-21 DIAGNOSIS — R928 Other abnormal and inconclusive findings on diagnostic imaging of breast: Secondary | ICD-10-CM | POA: Diagnosis not present

## 2022-02-21 DIAGNOSIS — N6321 Unspecified lump in the left breast, upper outer quadrant: Secondary | ICD-10-CM | POA: Diagnosis not present

## 2022-02-21 DIAGNOSIS — R922 Inconclusive mammogram: Secondary | ICD-10-CM | POA: Diagnosis not present

## 2022-02-26 DIAGNOSIS — R7303 Prediabetes: Secondary | ICD-10-CM | POA: Diagnosis not present

## 2022-02-26 DIAGNOSIS — E663 Overweight: Secondary | ICD-10-CM | POA: Diagnosis not present

## 2022-02-26 DIAGNOSIS — E782 Mixed hyperlipidemia: Secondary | ICD-10-CM | POA: Diagnosis not present

## 2022-02-26 DIAGNOSIS — Z6826 Body mass index (BMI) 26.0-26.9, adult: Secondary | ICD-10-CM | POA: Diagnosis not present

## 2022-03-11 DIAGNOSIS — D485 Neoplasm of uncertain behavior of skin: Secondary | ICD-10-CM | POA: Diagnosis not present

## 2022-03-11 DIAGNOSIS — L821 Other seborrheic keratosis: Secondary | ICD-10-CM | POA: Diagnosis not present

## 2022-03-11 DIAGNOSIS — L298 Other pruritus: Secondary | ICD-10-CM | POA: Diagnosis not present

## 2022-03-11 DIAGNOSIS — E663 Overweight: Secondary | ICD-10-CM | POA: Diagnosis not present

## 2022-03-14 DIAGNOSIS — J40 Bronchitis, not specified as acute or chronic: Secondary | ICD-10-CM | POA: Diagnosis not present

## 2022-03-17 DIAGNOSIS — R42 Dizziness and giddiness: Secondary | ICD-10-CM | POA: Diagnosis not present

## 2022-03-19 DIAGNOSIS — J069 Acute upper respiratory infection, unspecified: Secondary | ICD-10-CM | POA: Diagnosis not present

## 2022-04-11 ENCOUNTER — Telehealth: Payer: Self-pay | Admitting: Gastroenterology

## 2022-04-11 NOTE — Telephone Encounter (Signed)
Good Afternoon Dr Barron Alvine   We have received a request from patient wanting to transfer care to Dr Meridee Score. Please advise.

## 2022-04-11 NOTE — Telephone Encounter (Signed)
Ok with me if approved by Dr. Meridee Score.

## 2022-04-15 ENCOUNTER — Emergency Department (HOSPITAL_BASED_OUTPATIENT_CLINIC_OR_DEPARTMENT_OTHER)
Admission: EM | Admit: 2022-04-15 | Discharge: 2022-04-15 | Disposition: A | Discharge status: Home / Self Care | Payer: BC Managed Care – PPO | Attending: Emergency Medicine | Admitting: Emergency Medicine

## 2022-04-15 ENCOUNTER — Other Ambulatory Visit: Payer: Self-pay

## 2022-04-15 ENCOUNTER — Encounter (HOSPITAL_BASED_OUTPATIENT_CLINIC_OR_DEPARTMENT_OTHER): Payer: Self-pay

## 2022-04-15 DIAGNOSIS — Z79890 Hormone replacement therapy (postmenopausal): Secondary | ICD-10-CM | POA: Diagnosis not present

## 2022-04-15 DIAGNOSIS — G43909 Migraine, unspecified, not intractable, without status migrainosus: Secondary | ICD-10-CM | POA: Diagnosis not present

## 2022-04-15 DIAGNOSIS — R519 Headache, unspecified: Secondary | ICD-10-CM | POA: Diagnosis not present

## 2022-04-15 DIAGNOSIS — E039 Hypothyroidism, unspecified: Secondary | ICD-10-CM | POA: Diagnosis not present

## 2022-04-15 DIAGNOSIS — Z7982 Long term (current) use of aspirin: Secondary | ICD-10-CM | POA: Insufficient documentation

## 2022-04-15 MED ORDER — ACETAMINOPHEN 500 MG PO TABS
1000.0000 mg | ORAL_TABLET | Freq: Once | ORAL | Status: AC
  Administered 2022-04-15: 1000 mg via ORAL
  Filled 2022-04-15: qty 2

## 2022-04-15 MED ORDER — METOCLOPRAMIDE HCL 5 MG/ML IJ SOLN
10.0000 mg | Freq: Once | INTRAMUSCULAR | Status: AC
Start: 2022-04-15 — End: 2022-04-15
  Administered 2022-04-15: 10 mg via INTRAVENOUS
  Filled 2022-04-15: qty 2

## 2022-04-15 MED ORDER — KETOROLAC TROMETHAMINE 15 MG/ML IJ SOLN
30.0000 mg | Freq: Once | INTRAMUSCULAR | Status: AC
Start: 2022-04-15 — End: 2022-04-15
  Administered 2022-04-15: 30 mg via INTRAVENOUS
  Filled 2022-04-15: qty 2

## 2022-04-15 MED ORDER — DIPHENHYDRAMINE HCL 50 MG/ML IJ SOLN
25.0000 mg | Freq: Once | INTRAMUSCULAR | Status: AC
  Administered 2022-04-15: 25 mg via INTRAVENOUS
  Filled 2022-04-15: qty 1

## 2022-04-15 MED ORDER — MAGNESIUM SULFATE IN D5W 1-5 GM/100ML-% IV SOLN
1.0000 g | Freq: Once | INTRAVENOUS | Status: AC
Start: 2022-04-15 — End: 2022-04-15
  Administered 2022-04-15: 1 g via INTRAVENOUS
  Filled 2022-04-15: qty 100

## 2022-04-15 MED ORDER — DIPHENHYDRAMINE HCL 50 MG/ML IJ SOLN
25.0000 mg | Freq: Once | INTRAMUSCULAR | Status: AC
Start: 2022-04-15 — End: 2022-04-15
  Administered 2022-04-15: 25 mg via INTRAVENOUS
  Filled 2022-04-15: qty 1

## 2022-04-15 NOTE — Telephone Encounter (Signed)
I am okay for transfer of care request. Please let the patient know however that Dr. Barron Alvine is one of our esophageal experts for our group just to ensure that she really wants to make that transition. Thanks. GM

## 2022-04-15 NOTE — ED Triage Notes (Signed)
Pt arrived POV accompanied by husband. Assisted to room by husband with eyes covered due to lights Reports woke up with migraine. Light sensitive, nauseated . No meds taken at home

## 2022-04-15 NOTE — ED Provider Notes (Signed)
[facility] EMERGENCY DEPARTMENT Provider Note   CSN: [id number] Arrival date & time: 04/15/22  6433     History  Chief Complaint  Patient presents with   Migraine    Christine Hendrix is a 56 y.o. female.  Patient is a 56 year old female with a past medical history of migraines, paroxysmal A-fib not on anticoagulation and hypothyroidism presenting to the emergency department with a migraine headache.  Patient states that she woke up this morning with a migraine.  She states that it feels similar to her prior headaches.  She states it is mostly around her right forehead and radiates to her entire head.  She states that she has had associated nausea and vomiting, photophobia and phonophobia.  She states that she feels hot and sweaty but has not had any fevers.  She denies any numbness or weakness or vision changes.  The history is provided by the patient and the spouse.  Migraine       Home Medications Prior to Admission medications   Medication Sig Start Date End Date Taking? Authorizing Provider  aspirin EC 81 MG tablet Take 1 tablet (81 mg total) by mouth daily. 03/24/18  Yes Lewayne Bunting, MD  amitriptyline (ELAVIL) 25 MG tablet Take 25 mg by mouth at bedtime. 03/04/21   [provider]  Clobetasol Propionate (TEMOVATE) 0.05 % external spray Apply topically 2 (two) times daily. 12/25/21   Everrett Coombe, DO  diltiazem (CARDIZEM CD) 120 MG 24 hr capsule Take 1 capsule (120 mg total) by mouth daily. 09/22/18 03/24/21  Lewayne Bunting, MD  EPINEPHrine 0.3 mg/0.3 mL IJ SOAJ injection SMARTSIG:0.3 Milliliter(s) IM Once PRN 01/26/21   [provider]  famotidine (PEPCID) 20 MG tablet TAKE 1 TABLET BY MOUTH EVERYDAY AT BEDTIME 11/15/21   Cirigliano, Vito V, DO  ibuprofen (ADVIL) 800 MG tablet Take 1 tablet (800 mg total) by mouth every 8 (eight) hours as needed. 12/11/20   Everrett Coombe, DO  levothyroxine (SYNTHROID) 75 MCG tablet TAKE 1 TABLET DAILY BEFORE  BREAKFAST 04/22/21   Everrett Coombe, DO  meclizine (ANTIVERT) 25 MG tablet Take 1 tablet (25 mg total) by mouth 3 (three) times daily as needed for dizziness. 02/20/21   Everrett Coombe, DO  NURTEC 75 MG TBDP Take 1 tablet by mouth every other day as needed. 03/04/21   [provider]  ondansetron (ZOFRAN-ODT) 8 MG disintegrating tablet Take 1 tablet (8 mg total) by mouth every 8 (eight) hours as needed for nausea or vomiting. 02/10/22   Everrett Coombe, DO  pantoprazole (PROTONIX) 40 MG tablet Take 1 tablet (40 mg total) by mouth daily. 12/26/21   Cirigliano, Vito V, DO  predniSONE (DELTASONE) 20 MG tablet Take 2 tablets (40 mg total) by mouth daily with breakfast. 09/12/21   Eustace Moore, MD  PREMARIN 1.25 MG tablet TAKE 1 TABLET DAILY 12/05/21   Everrett Coombe, DO  SUMAtriptan (IMITREX) 100 MG tablet TAKE 1 TABLET EVERY 2 HOURS AS NEEDED FOR MIGRAINE, MAY REPEAT IN 2 HOURS IF HEADACHE PERSISTS OR RECURS 08/19/21   Everrett Coombe, DO  zonisamide (ZONEGRAN) 25 MG capsule Take by mouth. 03/04/21   [provider]      Allergies    Codeine, Solifenacin, and Molds & smuts    Review of Systems   Review of Systems  Physical Exam Updated Vital Signs BP 118/69   Pulse 62   Temp (!) 97.5 F (36.4 C)   Resp 18   Ht 5\' 4"  (  1.626 m)   Wt 67.1 kg   SpO2 99%   BMI 25.40 kg/m  Physical Exam Vitals and nursing note reviewed.  Constitutional:      General: She is not in acute distress.    Comments: Uncomfortable appearing with a mask on  HENT:     Head: Normocephalic and atraumatic.     Nose: Nose normal.     Mouth/Throat:     Mouth: Mucous membranes are moist.     Pharynx: Oropharynx is clear.  Eyes:     Extraocular Movements: Extraocular movements intact.     Conjunctiva/sclera: Conjunctivae normal.     Pupils: Pupils are equal, round, and reactive to light.  Neck:     Comments: No midline neck tenderness Cardiovascular:     Rate and Rhythm: Normal rate and regular  rhythm.     Heart sounds: Normal heart sounds.  Pulmonary:     Effort: Pulmonary effort is normal.     Breath sounds: Normal breath sounds.  Abdominal:     General: Abdomen is flat.     Palpations: Abdomen is soft.     Tenderness: There is no abdominal tenderness.  Musculoskeletal:        General: Normal range of motion.     Cervical back: Normal range of motion and neck supple. No rigidity.  Skin:    General: Skin is warm and dry.  Neurological:     General: No focal deficit present.     Mental Status: She is alert and oriented to person, place, and time.     Cranial Nerves: No cranial nerve deficit.     Sensory: No sensory deficit.     Motor: No weakness.  Psychiatric:        Behavior: Behavior normal.        Thought Content: Thought content normal.        Judgment: Judgment normal.     ED Results / Procedures / Treatments   Labs (all labs ordered are listed, but only abnormal results are displayed) Labs Reviewed - No data to display  EKG None  Radiology No results found.  Procedures Procedures    Medications Ordered in ED Medications  ketorolac (TORADOL) 15 MG/ML injection 30 mg (30 mg Intravenous Given 04/15/22 0750)  metoCLOPramide (REGLAN) injection 10 mg (10 mg Intravenous Given 04/15/22 0750)  diphenhydrAMINE (BENADRYL) injection 25 mg (25 mg Intravenous Given 04/15/22 0749)  acetaminophen (TYLENOL) tablet 1,000 mg (1,000 mg Oral Given 04/15/22 0914)  magnesium sulfate IVPB 1 g 100 mL (1 g Intravenous New Bag/Given 04/15/22 0914)  diphenhydrAMINE (BENADRYL) injection 25 mg (25 mg Intravenous Given 04/15/22 0913)    ED Course/ Medical Decision Making/ A&P Clinical Course as of 04/15/22 1030  Tue Apr 15, 2022  0858 Upon reassessment, the patient's headache has improved but not yet completely resolved.  She will be given additional dose of Benadryl, Tylenol and magnesium and will be reassessed. [VK]  1027 Upon reassessment, The patient's headache has  significantly improved.  She feels comfortable with discharge home and states she has neurology follow-up next Monday.  Patient was given strict return precautions. [VK]    Clinical Course User Index [VK] Rexford Maus, DO                           Medical Decision Making This patient presents to the ED with chief complaint(s) of migraine headache with pertinent past medical history of Raynaud's, paroxysmal A-fib  not on anticoagulation, hypothyroidism which further complicates the presenting complaint. The complaint involves an extensive differential diagnosis and also carries with it a high risk of complications and morbidity.    The differential diagnosis includes patient is headache is similar to prior migraine headaches in the past making this likely, she has no focal neurologic deficits making ICH or mass effect unlikely and no history of any trauma, she is afebrile with no neck stiffness making meningitis unlikely  Additional history obtained: Additional history obtained from spouse Records reviewed outpatient neurology records  ED Course and Reassessment: On patient's arrival to the emergency department she is very uncomfortable appearing with an eye mask in place.  She states that she has a migraine treatment plan for the ER including Reglan, Toradol and Benadryl as needed she will be given these medications to help with her headache.  Lights were turned down and she will be closely reassessed.  Independent labs interpretation:  N/A  Independent visualization of imaging: N/A  Consultation: - Consulted or discussed management/test interpretation w/ external professional: N/A  Consideration for admission or further workup: Patient has no emergent conditions requiring admission or further work-up at this time and is stable for discharge home with primary care or neurology follow-up Social Determinants of health: N/A    Risk OTC drugs. Prescription drug  management.          Final Clinical Impression(s) / ED Diagnoses Final diagnoses:  Migraine without status migrainosus, not intractable, unspecified migraine type    Rx / DC Orders ED Discharge Orders     None         Rexford Maus, DO 04/15/22 1030

## 2022-04-15 NOTE — ED Notes (Signed)
Pt states that her pain is now gone. Resting in bed with eyes closed

## 2022-04-15 NOTE — Discharge Instructions (Signed)
You are seen in the emergency department for your headache.  We gave you headache medicine through the IV and you had improvement of your headache.  You should follow-up with your primary doctor or your neurologist as planned to have your symptoms rechecked and to see if you need any medication changes to your current migraine regimen.  You should return to the emergency department if your headache is getting worse, you have repetitive vomiting and cannot tolerate oral medications, your headache is different from her normal migraines or if you have any other new or concerning symptoms.

## 2022-04-16 ENCOUNTER — Encounter: Payer: Self-pay | Admitting: Neurology

## 2022-04-16 ENCOUNTER — Other Ambulatory Visit: Payer: Self-pay | Admitting: Family Medicine

## 2022-04-21 ENCOUNTER — Ambulatory Visit (INDEPENDENT_AMBULATORY_CARE_PROVIDER_SITE_OTHER): Payer: BC Managed Care – PPO | Admitting: Psychiatry

## 2022-04-21 ENCOUNTER — Other Ambulatory Visit: Payer: Self-pay | Admitting: Psychiatry

## 2022-04-21 ENCOUNTER — Encounter: Payer: Self-pay | Admitting: Psychiatry

## 2022-04-21 VITALS — BP 139/82 | HR 78 | Ht 64.0 in | Wt 154.0 lb

## 2022-04-21 DIAGNOSIS — G43119 Migraine with aura, intractable, without status migrainosus: Secondary | ICD-10-CM | POA: Diagnosis not present

## 2022-04-21 MED ORDER — NORTRIPTYLINE HCL 25 MG PO CAPS: 25.0000 mg | ORAL_CAPSULE | Freq: Every day | ORAL | 4 refills | Status: DC

## 2022-04-21 MED ORDER — ZONISAMIDE 100 MG PO CAPS: 100.0000 mg | ORAL_CAPSULE | Freq: Every day | ORAL | 4 refills | Status: DC

## 2022-04-21 MED ORDER — ZAVZPRET 10 MG/ACT NA SOLN: 1.0000 | NASAL | 6 refills | Status: DC | PRN

## 2022-04-21 MED ORDER — AIMOVIG 140 MG/ML ~~LOC~~ SOAJ: 140.0000 mg | SUBCUTANEOUS | 6 refills | Status: DC

## 2022-04-21 NOTE — Patient Instructions (Signed)
Plan: Prevention: -Increase Zonisamide to 100 mg at bedtime -Stop amitriptyline. Start nortriptyline 25 mg at bedtime -Continue Aimovig  Rescue: -Try Bernita Raisin as needed for migraine. Take 1/2 or 1 pill at onset of migraine. May repeat a dose in 24 hours. You can take this medication with Imitrex if needed -Start Zavzpret nasal spray. Spray once in nostril at onset of migraine. Do not take this medication with Ubrelvy. You can take this medication with Imitrex if needed

## 2022-04-21 NOTE — Progress Notes (Signed)
Referring:  Preston Fleeting, [address] [facility],  [place] [address]  PCP: Luetta Nutting, DO  Neurology was asked to evaluate Christine Hendrix, a 56 year old female for a chief complaint of headaches.  Our recommendations of care will be communicated by shared medical record.    CC:  headaches  History provided from self  HPI:  Medical co-morbidities: afib  The patient presents for evaluation of headaches which began 22 years ago. Headaches are associated with photophobia, phonophobia, nausea, and vomiting. She cannot swallow pills or even water with severe migraines. Even the nasal spray is difficult because she cannot smell anything without vomiting. She does occasionally have starbursts in her vision and paresthesias in her face and hands associated with her migraines. Migraines can last for several hours at a time. Migraine frequency varies. Typically has migraines 3-4 times per week.  She is currently taking amitriptyline 25 mg QHS, zonisamide 75 mg daily, and Aimovig 140 mg monthly for migraine prevention. She does feel these medications have helped with her migraines. She does have concerns that she has gained weight with amitriptyline. For rescue she will take Imitrex oral or nasal spray. This works inconsistently. Can only take this with her more mild migraines as she cannot tolerate any medications with her most severe migraines due to vomiting.  Headache History: Onset: 22 years ago Triggers: weather changes, heat, often wakes up with them Aura: starbursts in vision, paresthesias in face and hands Associated Symptoms:  Photophobia: yes  Phonophobia: yes  Nausea: yes Vomiting: yes Worse with activity?: yes Duration of headaches: several hours  Migraine days per month: 14 Headache free days per month: 16  Current Treatment: Abortive Imitrex pill and nasal spray  Preventative Amitriptyline 25 mg QHS Zonisamide 75 mg QHS Aimovig 140 mg  monthly  Prior Therapies                                 Amitriptyline 25 mg QHS Effexor Topamax Zonisamide 75 mg QHS Propranolol Metoprolol Gabapentin Emgality Aimovig 140 mg monthly  Rescue: Excedrin ibuprofen Fioricet Nurtec 75 mg PRN - dizziness Imitrex pill, subq, and nasal spray Maxalt Relpax Zomig Migranal Naratriptan 2.5 mg PRN Zofran 8 mg PRN Reglan Phenergan Compazine suppository - diarrhea Chlorzoxazone 500 mg PRN   LABS: CBC    Component Value Date/Time   WBC 9.8 12/05/2017 1026   RBC 4.92 12/05/2017 1026   HGB 14.3 12/05/2017 1026   HCT 41.7 12/05/2017 1026   PLT 268 12/05/2017 1026   MCV 84.8 12/05/2017 1026   MCH 29.1 12/05/2017 1026   MCHC 34.3 12/05/2017 1026   RDW 12.6 12/05/2017 1026   LYMPHSABS 2.3 12/05/2017 1026   MONOABS 0.8 12/05/2017 1026   EOSABS 0.1 12/05/2017 1026   BASOSABS 0.0 12/05/2017 1026      Latest Ref Rng & Units 10/19/2020   12:00 AM 10/28/2019    9:05 AM 02/21/2019    3:01 PM  CMP  Glucose 65 - 99 mg/dL 86   99   BUN 7 - 25 mg/dL 16  14     14    Creatinine 0.50 - 1.05 mg/dL 0.82  0.8     0.74   Sodium 135 - 146 mmol/L 142  139     137   Potassium 3.5 - 5.3 mmol/L 3.7  4.7     3.9   Chloride 98 - 110 mmol/L  100  103     101   CO2 20 - 32 mmol/L 27  23     26    Calcium 8.6 - 10.4 mg/dL 9.0   8.8   Total Protein 6.0 - 8.3 g/dL   6.9   Total Bilirubin 0.2 - 1.2 mg/dL   0.3   Alkaline Phos 39 - 117 U/L   61   AST 0 - 37 U/L   12   ALT 0 - 35 U/L   11      This result is from an external source.     IMAGING:  Patient reports she has previously had normal MRIs of her brain. Records are unavailable for review.  Current Outpatient Medications on File Prior to Visit  Medication Sig Dispense Refill   aspirin EC 81 MG tablet Take 1 tablet (81 mg total) by mouth daily. 90 tablet 3   chlorzoxazone (PARAFON) 500 MG tablet Take 500 mg by mouth 4 (four) times daily as needed for muscle spasms.     Clobetasol Propionate  (TEMOVATE) 0.05 % external spray Apply topically 2 (two) times daily. 125 mL 1   famotidine (PEPCID) 20 MG tablet TAKE 1 TABLET BY MOUTH EVERYDAY AT BEDTIME 90 tablet 1   ibuprofen (ADVIL) 800 MG tablet Take 1 tablet (800 mg total) by mouth every 8 (eight) hours as needed. 90 tablet 2   levothyroxine (SYNTHROID) 75 MCG tablet TAKE 1 TABLET DAILY BEFORE BREAKFAST 90 tablet 3   meclizine (ANTIVERT) 25 MG tablet Take 1 tablet (25 mg total) by mouth 3 (three) times daily as needed for dizziness. 30 tablet 1   ondansetron (ZOFRAN-ODT) 8 MG disintegrating tablet Take 1 tablet (8 mg total) by mouth every 8 (eight) hours as needed for nausea or vomiting. 180 tablet 1   pantoprazole (PROTONIX) 40 MG tablet Take 1 tablet (40 mg total) by mouth daily. 90 tablet 2   PREMARIN 1.25 MG tablet TAKE 1 TABLET DAILY 90 tablet 3   SUMAtriptan (IMITREX) 100 MG tablet TAKE 1 TABLET EVERY 2 HOURS AS NEEDED FOR MIGRAINE, MAY REPEAT IN 2 HOURS IF HEADACHE PERSISTS OR RECURS 9 tablet 27   diltiazem (CARDIZEM CD) 120 MG 24 hr capsule Take 1 capsule (120 mg total) by mouth daily. 90 capsule 3   No current facility-administered medications on file prior to visit.     Allergies: Allergies  Allergen Reactions   Codeine Nausea And Vomiting, Other (See Comments) and Rash    Vomiting and dizziness   Solifenacin Other (See Comments)    BLURRED VISION Other reaction(s): Hallucinations Visual disturbances   Molds & Smuts Other (See Comments)    Family History: Family History  Problem Relation Age of Onset   Heart disease Father    Heart failure Father    Colon cancer Father        dx early 63's    Stomach cancer Brother    Colon cancer Brother    Liver cancer Brother    Heart disease Mother    CAD Mother    Esophageal cancer Neg Hx      Past Medical History: Past Medical History:  Diagnosis Date   Arthritis    neck   Atrial fibrillation ([place])    Colitis    chronic    Colitis    Dental crowns present     also dental implant lower right   Family history of colon cancer    Hyperlipidemia    Hypothyroidism  Kidney stone on left side 03/27/2017   Migraines    MVP (mitral valve prolapse)    Nasal turbinate hypertrophy 04/2016   Nephrolithiasis    PONV (postoperative nausea and vomiting)    Psoriasis    TMJ syndrome    limited jaw opening    Past Surgical History Past Surgical History:  Procedure Laterality Date   65 HOUR [place] STUDY N/A 10/30/2021   Procedure: 24 HOUR [place] STUDY;  Surgeon: Lavena Bullion, DO;  Location: [facility];  Service: Gastroenterology;  Laterality: N/A;   ABDOMINAL HYSTERECTOMY     complete   BUNIONECTOMY WITH HAMMERTOE RECONSTRUCTION Left 03/04/2016   4th/5th toes   CHOLECYSTECTOMY     COLONOSCOPY     More than 5 years. [place]    ESOPHAGEAL MANOMETRY N/A 10/30/2021   Procedure: ESOPHAGEAL MANOMETRY (EM);  Surgeon: Lavena Bullion, DO;  Location: [facility];  Service: Gastroenterology;  Laterality: N/A;  ph impendence   ESOPHAGOGASTRODUODENOSCOPY     about 5 years ago. [facility] [employer]   TMJ ARTHROPLASTY     TRIGEMINAL NERVE DECOMPRESSION     TURBINATE REDUCTION Bilateral 05/23/2016   Procedure: BILATERAL TURBINATE REDUCTION;  Surgeon: Leta Baptist, MD;  Location: [place];  Service: ENT;  Laterality: Bilateral;    Social History: Social History   Tobacco Use   Smoking status: Never   Smokeless tobacco: Never  Vaping Use   Vaping Use: Never used  Substance Use Topics   Alcohol use: No    Alcohol/week: 0.0 standard drinks of alcohol   Drug use: No    ROS: Negative for fevers, chills. Positive for headaches. All other systems reviewed and negative unless stated otherwise in HPI.   Physical Exam:   Vital Signs: BP 139/82   Pulse 78   Ht 5\' 4"  (1.626 m)   Wt 154 lb (69.9 kg)   BMI 26.43 kg/m  GENERAL: well appearing,in no acute distress,alert SKIN:  Color, texture, turgor normal.  No rashes or lesions HEAD:  Normocephalic/atraumatic. CV:  RRR RESP: Normal respiratory effort MSK: no tenderness to palpation over occiput, neck, or shoulders  NEUROLOGICAL: Mental Status: Alert, oriented to person, place and time,Follows commands Cranial Nerves: PERRL, visual fields intact to confrontation, extraocular movements intact, facial sensation intact, no facial droop or ptosis, hearing grossly intact, no dysarthria Motor: muscle strength 5/5 both upper and lower extremities,no drift, normal tone Reflexes: 2+ throughout Sensation: intact to light touch all 4 extremities Coordination: Finger-to- nose-finger intact bilaterally Gait: normal-based   IMPRESSION: 56 year old female with a history of afib who presents for transfer of care for migraines. She continues to have frequent migraines with Aimovig, zonisamide, and amitriptyline. Discussed prevention options. At this time she is hesitant to switch Aimovig to an alternate CGRP or Botox. Will increase zonisamide to 100 mg QHS. Will also switch amitriptyline to nortriptyline as this may be more weight-neutral, and we may be able to uptitrate it further if she tolerates it well. Discussed device options as well including Nerivio. Ubrelvy sample provided for rescue. Will also send in a prescription for nasal Zavzpret for when she is unable to take medication PO.  PLAN: -Prevention: Increase zonisamide to 100 mg QHS, switch amitriptyline to nortriptyline 25 mg QHS, continue Aimovig 140 mg monthly -Rescue: Continue Imitrex nasal spray and pill. Will send rx for Zavzpret nasal spray to see if this is more effective. Sample for Roselyn Meier provided for when she is able  to take medication PO -Next steps: consider Botox, alternate CGRP, Qulipta for prevention   I spent a total of 60 minutes chart reviewing and counseling the patient. Headache education was done. Discussed treatment options including preventive and acute medications. Discussed  medication overuse headache and to limit use of acute treatments to no more than 2 days/week or 10 days/month. Discussed medication side effects, adverse reactions and drug interactions. Written educational materials and patient instructions outlining all of the above were given.  Follow-up: 4 months   Ocie Doyne, MD 04/21/2022   2:10 PM

## 2022-04-22 ENCOUNTER — Telehealth: Payer: Self-pay

## 2022-04-22 NOTE — Telephone Encounter (Signed)
PA submitted via CMM  Key: [id number] [employer] is reviewing your PA request and will respond within 24 hours for Medicaid or up to 72 hours for non-Medicaid plan  Bin [id number] PCN  [employer] [id number]

## 2022-04-22 NOTE — Telephone Encounter (Signed)
PA submitted via CMM  Key: [id number] Coverage Start Date:03/23/2022;Coverage End Date:04/22/2023

## 2022-04-22 NOTE — Telephone Encounter (Signed)
PA in process

## 2022-04-23 NOTE — Telephone Encounter (Signed)
Approvedon November 28 Coverage Start Date:03/23/2022;Coverage End Date:04/22/2023

## 2022-04-23 NOTE — Telephone Encounter (Signed)
Zavzpret Approved on November 28 Coverage Start Date:03/23/2022;Coverage End Date:04/22/2023 pharmacy notified

## 2022-04-23 NOTE — Telephone Encounter (Signed)
Aimovig Approved on November 28 Coverage Start Date:03/23/2022;Coverage End Date:04/22/2023

## 2022-05-01 ENCOUNTER — Telehealth: Payer: Self-pay

## 2022-05-01 MED ORDER — LEVOTHYROXINE SODIUM 75 MCG PO TABS: 75.0000 ug | ORAL_TABLET | Freq: Every day | ORAL | 0 refills | Status: DC

## 2022-05-01 NOTE — Telephone Encounter (Signed)
Patient called office requesting medication refill on her SYNTHROID, please advise, thanks!!

## 2022-05-01 NOTE — Telephone Encounter (Signed)
Sending 30 day medication refill.   Pls contact the pt to schedule with Dr. Ashley Royalty. Last seen 04/2021. Thanks

## 2022-05-06 ENCOUNTER — Telehealth: Payer: Self-pay

## 2022-05-06 NOTE — Telephone Encounter (Signed)
Forwarding to [place].

## 2022-05-06 NOTE — Telephone Encounter (Signed)
Patient called about her needing prior authorization for her PREMARIN medication, please advise, thanks!

## 2022-05-06 NOTE — Telephone Encounter (Signed)
Thanks

## 2022-05-06 NOTE — Telephone Encounter (Signed)
Initiated Prior authorization QBH:[id number] 1.25MG  tablets Via: Covermymeds Case/Key:[id number]  Status: approved  as of 05/06/22 Reason:Coverage Start Date:04/06/2022;Coverage End Date:05/06/2023; Notified Pt via: Mychart

## 2022-05-07 ENCOUNTER — Other Ambulatory Visit: Payer: Self-pay | Admitting: Family Medicine

## 2022-05-12 ENCOUNTER — Telehealth: Payer: Self-pay | Admitting: Psychiatry

## 2022-05-12 ENCOUNTER — Ambulatory Visit (INDEPENDENT_AMBULATORY_CARE_PROVIDER_SITE_OTHER): Payer: BC Managed Care – PPO | Admitting: Family Medicine

## 2022-05-12 ENCOUNTER — Ambulatory Visit: Payer: BC Managed Care – PPO | Admitting: Psychiatry

## 2022-05-12 ENCOUNTER — Encounter: Payer: Self-pay | Admitting: Family Medicine

## 2022-05-12 VITALS — BP 151/83 | HR 83 | Ht 64.0 in | Wt 151.0 lb

## 2022-05-12 DIAGNOSIS — I480 Paroxysmal atrial fibrillation: Secondary | ICD-10-CM

## 2022-05-12 DIAGNOSIS — F5101 Primary insomnia: Secondary | ICD-10-CM

## 2022-05-12 DIAGNOSIS — R7303 Prediabetes: Secondary | ICD-10-CM | POA: Insufficient documentation

## 2022-05-12 DIAGNOSIS — R635 Abnormal weight gain: Secondary | ICD-10-CM

## 2022-05-12 DIAGNOSIS — M2550 Pain in unspecified joint: Secondary | ICD-10-CM

## 2022-05-12 DIAGNOSIS — E039 Hypothyroidism, unspecified: Secondary | ICD-10-CM

## 2022-05-12 DIAGNOSIS — Z13220 Encounter for screening for lipoid disorders: Secondary | ICD-10-CM

## 2022-05-12 DIAGNOSIS — R5383 Other fatigue: Secondary | ICD-10-CM

## 2022-05-12 DIAGNOSIS — G43711 Chronic migraine without aura, intractable, with status migrainosus: Secondary | ICD-10-CM

## 2022-05-12 NOTE — Telephone Encounter (Signed)
Pt called and LVM stating that she has been having issues with her migraines recently and is wanting to know if she can discuss with RN and also to see if she can be seen sooner than scheduled appt. Please advise.

## 2022-05-12 NOTE — Patient Instructions (Addendum)
Your BP is elevated today.  Please check this at home and send me readings in a couple of weeks.   Have labs completed when fasting.   See me again in 6 months.

## 2022-05-12 NOTE — Telephone Encounter (Signed)
Contacted pt back, LVM rq CB  

## 2022-05-13 ENCOUNTER — Encounter: Payer: Self-pay | Admitting: Family Medicine

## 2022-05-13 ENCOUNTER — Other Ambulatory Visit: Payer: Self-pay | Admitting: Family Medicine

## 2022-05-13 MED ORDER — PREDNISONE 10 MG (48) PO TBPK: ORAL_TABLET | ORAL | 0 refills | Status: DC

## 2022-05-13 MED ORDER — TRAMADOL HCL 50 MG PO TABS: 50.0000 mg | ORAL_TABLET | Freq: Three times a day (TID) | ORAL | 0 refills | Status: AC | PRN

## 2022-05-13 NOTE — Telephone Encounter (Addendum)
Pt returned call, stated her migraines have increased since last visit. I had an extended conversation with pt regarding her medications and how she should be taking them. She was frustrated and getting emotional stating I was not helping her and she guess she will just have to wait 4 months and do nothing. She confirmed she did Increase zonisamide to 100 mg QHS, taking nortriptyline 25 mg QHS and Aimovig 140 mg monthly. She did not start the Zavzpret nasal spray or try the [place] sample that was provided. I advised her I am trying to help her but we cannot help her if she doesn't take the medications that was provided. Reiterated to her start the Zavzpret nasal spray or try the Ubrelvy, if Bernita Raisin is effective we will prescribe. She verbally understood and was appreciative.

## 2022-05-14 DIAGNOSIS — R7303 Prediabetes: Secondary | ICD-10-CM | POA: Diagnosis not present

## 2022-05-14 DIAGNOSIS — M2550 Pain in unspecified joint: Secondary | ICD-10-CM | POA: Diagnosis not present

## 2022-05-14 DIAGNOSIS — E039 Hypothyroidism, unspecified: Secondary | ICD-10-CM | POA: Diagnosis not present

## 2022-05-14 DIAGNOSIS — R5383 Other fatigue: Secondary | ICD-10-CM | POA: Diagnosis not present

## 2022-05-14 DIAGNOSIS — I480 Paroxysmal atrial fibrillation: Secondary | ICD-10-CM | POA: Diagnosis not present

## 2022-05-14 DIAGNOSIS — Z13220 Encounter for screening for lipoid disorders: Secondary | ICD-10-CM | POA: Diagnosis not present

## 2022-05-14 LAB — CBC AND DIFFERENTIAL
HCT: 38 (ref 36–46)
Hemoglobin: 12.4 (ref 12.0–16.0)
Neutrophils Absolute: 3.5
Platelets: 324 10*3/uL (ref 150–400)
WBC: 6.5

## 2022-05-14 LAB — BASIC METABOLIC PANEL
BUN: 16 (ref 4–21)
CO2: 24 — AB (ref 13–22)
Chloride: 102 (ref 99–108)
Creatinine: 1 (ref 0.5–1.1)
Glucose: 88
Potassium: 4.3 mEq/L (ref 3.5–5.1)
Sodium: 141 (ref 137–147)

## 2022-05-14 LAB — HEPATIC FUNCTION PANEL
ALT: 15 U/L (ref 7–35)
AST: 19 (ref 13–35)
Alkaline Phosphatase: 78 (ref 25–125)
Bilirubin, Total: 0.3

## 2022-05-14 LAB — COMPREHENSIVE METABOLIC PANEL
Albumin: 4.2 (ref 3.5–5.0)
Calcium: 9.5 (ref 8.7–10.7)
Globulin: 2.7
eGFR: 67

## 2022-05-14 LAB — LIPID PANEL
Cholesterol: 215 — AB (ref 0–200)
HDL: 85 — AB (ref 35–70)
LDL Cholesterol: 106
LDl/HDL Ratio: 2.5
Triglycerides: 142 (ref 40–160)

## 2022-05-14 LAB — TSH: TSH: 2.01 (ref 0.41–5.90)

## 2022-05-14 LAB — SEDIMENTATION RATE: Sed Rate: 17

## 2022-05-14 LAB — HEMOGLOBIN A1C: Hemoglobin A1C: 5.9

## 2022-05-14 LAB — CBC: RBC: 4.46 (ref 3.87–5.11)

## 2022-05-16 LAB — COMPREHENSIVE METABOLIC PANEL
ALT: 15 IU/L (ref 0–32)
AST: 19 IU/L (ref 0–40)
Albumin/Globulin Ratio: 1.6 (ref 1.2–2.2)
Albumin: 4.2 g/dL (ref 3.8–4.9)
Alkaline Phosphatase: 78 IU/L (ref 44–121)
BUN/Creatinine Ratio: 16 (ref 9–23)
BUN: 16 mg/dL (ref 6–24)
Bilirubin Total: 0.3 mg/dL (ref 0.0–1.2)
CO2: 24 mmol/L (ref 20–29)
Calcium: 9.5 mg/dL (ref 8.7–10.2)
Chloride: 102 mmol/L (ref 96–106)
Creatinine, Ser: 0.99 mg/dL (ref 0.57–1.00)
Globulin, Total: 2.7 g/dL (ref 1.5–4.5)
Glucose: 88 mg/dL (ref 70–99)
Potassium: 4.3 mmol/L (ref 3.5–5.2)
Sodium: 141 mmol/L (ref 134–144)
Total Protein: 6.9 g/dL (ref 6.0–8.5)
eGFR: 67 mL/min/{1.73_m2} (ref >59)

## 2022-05-16 LAB — CBC WITH DIFFERENTIAL/PLATELET
Basophils Absolute: 0 10*3/uL (ref 0.0–0.2)
Basos: 1 %
EOS (ABSOLUTE): 0.1 10*3/uL (ref 0.0–0.4)
Eos: 2 %
Hematocrit: 37.6 % (ref 34.0–46.6)
Hemoglobin: 12.4 g/dL (ref 11.1–15.9)
Immature Grans (Abs): 0 10*3/uL (ref 0.0–0.1)
Immature Granulocytes: 0 %
Lymphocytes Absolute: 2.3 10*3/uL (ref 0.7–3.1)
Lymphs: 35 %
MCH: 27.8 pg (ref 26.6–33.0)
MCHC: 33 g/dL (ref 31.5–35.7)
MCV: 84 fL (ref 79–97)
Monocytes Absolute: 0.6 10*3/uL (ref 0.1–0.9)
Monocytes: 9 %
Neutrophils Absolute: 3.5 10*3/uL (ref 1.4–7.0)
Neutrophils: 53 %
Platelets: 324 10*3/uL (ref 150–450)
RBC: 4.46 x10E6/uL (ref 3.77–5.28)
RDW: 13 % (ref 11.7–15.4)
WBC: 6.5 10*3/uL (ref 3.4–10.8)

## 2022-05-16 LAB — SEDIMENTATION RATE: Sed Rate: 17 mm/hr (ref 0–40)

## 2022-05-16 LAB — LIPID PANEL
Chol/HDL Ratio: 2.5 ratio (ref 0.0–4.4)
Cholesterol, Total: 215 mg/dL — ABNORMAL HIGH (ref 100–199)
HDL: 85 mg/dL (ref >39)
LDL Chol Calc (NIH): 106 mg/dL — ABNORMAL HIGH (ref 0–99)
Triglycerides: 142 mg/dL (ref 0–149)
VLDL Cholesterol Cal: 24 mg/dL (ref 5–40)

## 2022-05-16 LAB — TSH: TSH: 2.01 u[IU]/mL (ref 0.450–4.500)

## 2022-05-16 LAB — VITAMIN B12: Vitamin B-12: 725 pg/mL (ref 232–1245)

## 2022-05-16 LAB — C-REACTIVE PROTEIN: CRP: 5 mg/L (ref 0–10)

## 2022-05-16 LAB — HEMOGLOBIN A1C
Est. average glucose Bld gHb Est-mCnc: 123 mg/dL
Hgb A1c MFr Bld: 5.9 % — ABNORMAL HIGH (ref 4.8–5.6)

## 2022-05-16 LAB — ANA,IFA RA DIAG PNL W/RFLX TIT/PATN
ANA Titer 1: NEGATIVE
Cyclic Citrullin Peptide Ab: 11 units (ref 0–19)
Rheumatoid fact SerPl-aCnc: <10 IU/mL (ref <14.0)

## 2022-05-19 ENCOUNTER — Encounter: Payer: Self-pay | Admitting: Family Medicine

## 2022-05-19 DIAGNOSIS — M2550 Pain in unspecified joint: Secondary | ICD-10-CM | POA: Insufficient documentation

## 2022-05-19 MED ORDER — NURTEC 75 MG PO TBDP: 75.0000 mg | ORAL_TABLET | Freq: Every day | ORAL | 1 refills | Status: DC | PRN

## 2022-05-19 NOTE — Assessment & Plan Note (Signed)
Management per cardiology.  Stable at this time with diltiazem.

## 2022-05-19 NOTE — Progress Notes (Signed)
Christine Hendrix - 56 y.o. female MRN [id number]  Date of birth: Jul 04, 1965  Subjective Chief Complaint  Patient presents with   Thyroid Problem    HPI Christine Hendrix is a 56 year old female here today for follow-up visit.  History of hypothyroidism.  She has noted some weight gain over the past year.  Previous TSH levels have been normal.  Energy levels are decreased as well.  Continues to have intermittent episodes of low back pain.  MRI completed earlier this year shows minimal degenerative changes of the lumbar spine without foraminal or canal stenosis.  Describes pain as sharp and radiating into the buttock area.  Denies weakness or numbness into the legs.  Steroids to help previously.  She is seeing neurology for management of migraines.  She continues to have breakthrough migraines.  Current management with Aimovig and Zonegran.  She was given sample Zavzpret by her neurologist however has not tried this yet.  She does admit to some increased stress.  Her job is fairly stressful and she spends a lot of extra time at work and when outside of work tends to continue to think about working on completing tasks.  He is using trazodone to help with sleep.  Continues to see cardiology for management of paroxysmal A-fib.  She is currently on diltiazem.  She reports that this seems to be working okay for her.  Denies any palpitations or chest pain.  She notes some arthralgias as well as myalgias.  No joint swelling.  ROS:  A comprehensive ROS was completed and negative except as noted per HPI  Allergies  Allergen Reactions   Codeine Nausea And Vomiting, Other (See Comments) and Rash    Vomiting and dizziness   Solifenacin Other (See Comments)    BLURRED VISION Other reaction(s): Hallucinations Visual disturbances   Molds & Smuts Other (See Comments)    Past Medical History:  Diagnosis Date   Arthritis    neck   Atrial fibrillation (HCC)    Colitis    chronic    Colitis    Dental  crowns present    also dental implant lower right   Family history of colon cancer    Hyperlipidemia    Hypothyroidism    Kidney stone on left side 03/27/2017   Migraines    MVP (mitral valve prolapse)    Nasal turbinate hypertrophy 04/2016   Nephrolithiasis    PONV (postoperative nausea and vomiting)    Psoriasis    TMJ syndrome    limited jaw opening    Past Surgical History:  Procedure Laterality Date   37 HOUR [place] STUDY N/A 10/30/2021   Procedure: 24 HOUR [place] STUDY;  Surgeon: Shellia Cleverly, DO;  [facility]: [facility];  Service: Gastroenterology;  Laterality: N/A;   ABDOMINAL HYSTERECTOMY     complete   BUNIONECTOMY WITH HAMMERTOE RECONSTRUCTION Left 03/04/2016   4th/5th toes   CHOLECYSTECTOMY     COLONOSCOPY     More than 5 years. [facility]. [place]    ESOPHAGEAL MANOMETRY N/A 10/30/2021   Procedure: ESOPHAGEAL MANOMETRY (EM);  Surgeon: Shellia Cleverly, DO;  Location: [facility];  Service: Gastroenterology;  Laterality: N/A;  ph impendence   ESOPHAGOGASTRODUODENOSCOPY     about 5 years ago. 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  Tobacco Use   Smoking status: Never   Smokeless tobacco: Never  Vaping Use   Vaping Use: Never used  Substance and Sexual Activity   Alcohol use: No    Alcohol/week: 0.0 standard drinks of alcohol   Drug use: No   Sexual activity: Not on file  Other Topics Concern   Not on file  Social History  Narrative   Not on file   Social Determinants of Health   Financial Resource Strain: Not on file  Food Insecurity: Not on file  Transportation Needs: Not on file  Physical Activity: Not on file  Stress: Not on file  Social Connections: Not on file    Family History  Problem Relation Age of Onset   Heart disease Father    Heart failure Father    Colon cancer Father        dx early 81's    Stomach cancer Brother    Colon cancer Brother    Liver cancer Brother    Heart disease Mother    CAD Mother    Esophageal cancer Neg Hx     Health Maintenance  Topic Date Due   HIV Screening  Never done   Hepatitis C Screening  Never done   DTaP/Tdap/Td (3 - Td or Tdap) 05/26/2020   INFLUENZA VACCINE  12/24/2021   Zoster Vaccines- Shingrix (2 of 2) 01/10/2022   COVID-19 Vaccine (5 - 2023-24 season) 01/24/2022   MAMMOGRAM  08/02/2022   COLONOSCOPY (Pts 45-92yrs Insurance coverage will need to be confirmed)  01/12/2025   HPV VACCINES  Aged Out   PAP SMEAR-Modifier  Discontinued     ----------------------------------------------------------------------------------------------------------------------------------------------------------------------------------------------------------------- Physical Exam BP (!) 151/83 (BP Location: Left Arm, Patient Position: Sitting, Cuff Size: Normal)   Pulse 83   Ht 5\' 4"  (1.626 m)   Wt 151 lb (68.5 kg)   SpO2 100%   BMI 25.92 kg/m   Physical Exam Constitutional:      Appearance: Normal appearance.  Eyes:     General: No scleral icterus. Cardiovascular:     Rate and Rhythm: Normal rate and regular rhythm.  Pulmonary:     Effort: Pulmonary effort is normal.     Breath sounds: Normal breath sounds.  Musculoskeletal:     Cervical back: Neck supple.  Neurological:     Mental Status: She is alert.  Psychiatric:        Mood and Affect: Mood normal.        Behavior: Behavior normal.      ------------------------------------------------------------------------------------------------------------------------------------------------------------------------------------------------------------------- Assessment and Plan  Prediabetes Updating A1c today.  Insomnia She will continue trazodone at current strength.  Abnormal weight gain We discussed working on dietary change as well as exercise however she feels that her work schedule oftentimes is allow for this.  She does seem to be extending herself beyond the requirements of her job which is creating more stress.  Discussed setting aside time for herself when possible.  Updating labs today.  Hypothyroidism She has a decreased energy levels.  Updating TSH today.  PAF (paroxysmal atrial fibrillation) (HCC) Management per cardiology.  Stable at this  time with diltiazem.  Intractable chronic migraine without aura and with status migrainosus Seeing neurology for management.  Has not tried Zavzpret yet.  I recommended she try this who has been acute migraine.  Alternatively we can consider another CGRP receptor antagonist such as Ubrelvy or Nurtec.  Arthralgia Inflammatory markers ordered.   No orders of the defined types were placed in this encounter.   Return in about 6 months (around 11/11/2022) for F/u Thyroid.    This visit occurred during the SARS-CoV-2 public health emergency.  Safety protocols were in place, including screening questions prior to the visit, additional usage of staff PPE, and extensive cleaning of exam room while observing appropriate contact time as indicated for disinfecting solutions.

## 2022-05-19 NOTE — Assessment & Plan Note (Signed)
We discussed working on dietary change as well as exercise however she feels that her work schedule oftentimes is allow for this.  She does seem to be extending herself beyond the requirements of her job which is creating more stress.  Discussed setting aside time for herself when possible.  Updating labs today.

## 2022-05-19 NOTE — Assessment & Plan Note (Signed)
Updating A1c today. ?

## 2022-05-19 NOTE — Assessment & Plan Note (Signed)
Inflammatory markers ordered.

## 2022-05-19 NOTE — Assessment & Plan Note (Signed)
She has a decreased energy levels.  Updating TSH today.

## 2022-05-19 NOTE — Assessment & Plan Note (Signed)
Seeing neurology for management.  Has not tried Zavzpret yet.  I recommended she try this who has been acute migraine.  Alternatively we can consider another CGRP receptor antagonist such as Ubrelvy or Nurtec.

## 2022-05-19 NOTE — Assessment & Plan Note (Signed)
She will continue trazodone at current strength.

## 2022-05-23 ENCOUNTER — Telehealth: Payer: Self-pay | Admitting: Family Medicine

## 2022-05-23 ENCOUNTER — Other Ambulatory Visit: Payer: Self-pay | Admitting: Medical-Surgical

## 2022-05-23 MED ORDER — NURTEC 75 MG PO TBDP: 75.0000 mg | ORAL_TABLET | Freq: Every day | ORAL | 1 refills | Status: AC | PRN

## 2022-05-23 NOTE — Progress Notes (Signed)
Nurtec sent to pharmacy as requested.

## 2022-05-23 NOTE — Telephone Encounter (Signed)
Patient called about nurtec odt 75mg   [facility] [place] [address] in [place]  [phone]

## 2022-05-30 ENCOUNTER — Other Ambulatory Visit: Payer: Self-pay | Admitting: Family Medicine

## 2022-06-04 ENCOUNTER — Other Ambulatory Visit (INDEPENDENT_AMBULATORY_CARE_PROVIDER_SITE_OTHER): Payer: BC Managed Care – PPO

## 2022-06-04 ENCOUNTER — Encounter: Payer: Self-pay | Admitting: Gastroenterology

## 2022-06-04 ENCOUNTER — Ambulatory Visit (INDEPENDENT_AMBULATORY_CARE_PROVIDER_SITE_OTHER): Payer: BC Managed Care – PPO | Admitting: Gastroenterology

## 2022-06-04 VITALS — BP 120/90 | HR 75 | Ht 64.0 in | Wt 151.0 lb

## 2022-06-04 DIAGNOSIS — K224 Dyskinesia of esophagus: Secondary | ICD-10-CM

## 2022-06-04 DIAGNOSIS — K219 Gastro-esophageal reflux disease without esophagitis: Secondary | ICD-10-CM

## 2022-06-04 DIAGNOSIS — K5900 Constipation, unspecified: Secondary | ICD-10-CM

## 2022-06-04 DIAGNOSIS — R12 Heartburn: Secondary | ICD-10-CM | POA: Diagnosis not present

## 2022-06-04 DIAGNOSIS — R1013 Epigastric pain: Secondary | ICD-10-CM

## 2022-06-04 DIAGNOSIS — K449 Diaphragmatic hernia without obstruction or gangrene: Secondary | ICD-10-CM | POA: Diagnosis not present

## 2022-06-04 LAB — COMPREHENSIVE METABOLIC PANEL
ALT: 15 U/L (ref 0–35)
AST: 17 U/L (ref 0–37)
Albumin: 4.1 g/dL (ref 3.5–5.2)
Alkaline Phosphatase: 69 U/L (ref 39–117)
BUN: 15 mg/dL (ref 6–23)
CO2: 27 mEq/L (ref 19–32)
Calcium: 8.6 mg/dL (ref 8.4–10.5)
Chloride: 106 mEq/L (ref 96–112)
Creatinine, Ser: 0.89 mg/dL (ref 0.40–1.20)
GFR: 72.09 mL/min (ref >60.00)
Glucose, Bld: 91 mg/dL (ref 70–99)
Potassium: 4.3 mEq/L (ref 3.5–5.1)
Sodium: 140 mEq/L (ref 135–145)
Total Bilirubin: 0.2 mg/dL (ref 0.2–1.2)
Total Protein: 7.4 g/dL (ref 6.0–8.3)

## 2022-06-04 LAB — CBC
HCT: 38 % (ref 36.0–46.0)
Hemoglobin: 12.4 g/dL (ref 12.0–15.0)
MCHC: 32.7 g/dL (ref 30.0–36.0)
MCV: 84.6 fl (ref 78.0–100.0)
Platelets: 295 10*3/uL (ref 150.0–400.0)
RBC: 4.49 Mil/uL (ref 3.87–5.11)
RDW: 13.7 % (ref 11.5–15.5)
WBC: 8.4 10*3/uL (ref 4.0–10.5)

## 2022-06-04 LAB — TSH: TSH: 1.35 u[IU]/mL (ref 0.35–5.50)

## 2022-06-04 LAB — LIPASE: Lipase: 31 U/L (ref 11.0–59.0)

## 2022-06-04 MED ORDER — AMITRIPTYLINE HCL 50 MG PO TABS: 50.0000 mg | ORAL_TABLET | Freq: Every day | ORAL | 3 refills | Status: DC

## 2022-06-04 NOTE — Patient Instructions (Addendum)
Your provider has requested that you go to the basement level for lab work before leaving today. Press "B" on the elevator. The lab is located at the first door on the left as you exit the elevator.  We have sent the following medications to your pharmacy for you to pick up at your convenience: Amitriptyline ( [facility])   You will be contacted by [facility] in the next 2 days to arrange a Abdominal ultrasound.  The number on your caller ID will be [phone], please answer when they call.  If you have not heard from them in 2 days please call [phone] to schedule.    Send mychart message In a few weeks to let us know how your doing with the Amitriptyline.  Due to recent changes in healthcare laws, you may see the results of your imaging and laboratory studies on MyChart before your provider has had a chance to review them.  We understand that in some cases there may be results that are confusing or concerning to you. Not all laboratory results come back in the same time frame and the provider may be waiting for multiple results in order to interpret others.  Please give [place] 48 hours in order for your provider to thoroughly review all the results before contacting the office for clarification of your results.    Toileting tips to help with your constipation - Drink at least 64-80 ounces of water/liquid per day. - Establish a time to try to move your bowels every day.  For many people, this is after a cup of coffee or after a meal such as breakfast. - Sit all of the way back on the toilet keeping your back fairly straight and while sitting up, try to rest the tops of your forearms on your upper thighs.   - Raising your feet with a step stool/squatty potty can be helpful to improve the angle that allows your stool to pass through the rectum. - Relax the rectum feeling it bulge toward the toilet water.  If you feel your rectum raising toward your body, you are contracting  rather than relaxing. - Breathe in and slowly exhale. "Belly breath" by expanding your belly towards your belly button. Keep belly expanded as you gently direct pressure down and back to the anus.  A low pitched GRRR sound can assist with increasing intra-abdominal pressure.  - Repeat 3-4 times. If unsuccessful, contract the pelvic floor to restore normal tone and get off the toilet.  Avoid excessive straining. - To reduce excessive wiping by teaching your anus to normally contract, place hands on outer aspect of knees and resist knee movement outward.  Hold 5-10 second then place hands just inside of knees and resist inward movement of knees.  Hold 5 seconds.  Repeat a few times each way.   Thank you for choosing me and [facility].  Dr. Rush Landmark

## 2022-06-04 NOTE — Progress Notes (Signed)
[facility] VISIT   Primary Care Provider Luetta Nutting, [facility] [place] [address] [phone]  Patient Profile: Christine Hendrix is a 57 y.o. female with a pmh significant for atrial fibrillation (ASA/diltiazem), hyperlipidemia, hypothyroidism, nephrolithiasis, family history colon cancer (father/brother), GERD, hiatal hernia, esophageal hypersensitivity.   The patient presents to the [facility] for an evaluation and management of problem(s) noted below:  Problem List 1. Esophageal hypersensitivity   2. Pyrosis   3. Hiatal hernia   4. Abdominal pain, epigastric   5. Constipation, unspecified constipation type     History of Present Illness Please see prior GI notes for full details of HPI.  Interval History This is a patient who previously saw Dr. Bryan Lemma and for whom I am seeing for the first time.  The patient has experienced issues of upper abdominal discomfort for quite a few years.  Many times she is described her issues as being reflux oriented and has felt that her symptoms have always been manage from that perspective but even with management of her heartburn, she is still having symptoms and wants to make sure nothing else is being missed.  She will randomly during the day have episodes of reflux.  To her these feel like acid reflux.  At times when she is sleeping and lying flat, she will wake up with some mild vomitus because of reflux that has occurred.  She takes pantoprazole on a daily basis.  She is not sure that twice daily dosing of pantoprazole has really made too much of a difference for her.  She will occasionally take Pepcid.  She regularly uses Gaviscon which helps her at times.  When she has the symptoms of the reflux she will also get nausea and so uses Zofran.  She has a history of migraines but most of her migraines occur during sleep.  She had been taking amitriptyline for migraine  prophylaxis and when Dr. Bryan Lemma had last messaged her in 2022 (after her pH impedance test showed esophageal hypersensitivity) he had recommended increasing amitriptyline up to 50 mg daily.  She was going to begin doing that but then saw her neurologist who was going to switch her to nortriptyline.  Then that neurologist closed their practice and she has just recently established with a new neurologist whom also agreed to initiate nortriptyline, but now has gone on leave.  So she remains on amitriptyline currently.  Her upper abdominal discomfort is in the upper abdomen and it does not move significantly from the midportion except when she is actually having pyrosis symptoms.   GI Review of Systems Positive as above including bloating at times Negative for dysphagia, odynophagia, alteration of bowel habits, melena, hematochezia  Review of Systems General: Denies fevers/chills/weight loss unintentionally Cardiovascular: Denies chest pain Pulmonary: Denies shortness of breath Gastroenterological: See HPI Genitourinary: Denies darkened urine  Hematological: Denies easy bruising/bleeding Dermatological: Denies jaundice Psychological: Mood is stable   Medications Current Outpatient Medications  Medication Sig Dispense Refill   AIMOVIG 140 MG/ML SOAJ Inject 140 mg into the skin every 30 (thirty) days. 1.12 mL 6   amitriptyline (ELAVIL) 50 MG tablet Take 1 tablet (50 mg total) by mouth at bedtime. 90 tablet 3   aspirin EC 81 MG tablet Take 1 tablet (81 mg total) by mouth daily. 90 tablet 3   chlorzoxazone (PARAFON) 500 MG tablet Take 500 mg by mouth 4 (four) times daily as needed for muscle spasms.  Clobetasol Propionate (TEMOVATE) 0.05 % external spray Apply topically 2 (two) times daily. 125 mL 1   diltiazem (CARDIZEM CD) 120 MG 24 hr capsule Take 1 capsule (120 mg total) by mouth daily. 90 capsule 3   famotidine (PEPCID) 20 MG tablet TAKE 1 TABLET BY MOUTH EVERYDAY AT BEDTIME 90 tablet 1    ibuprofen (ADVIL) 800 MG tablet TAKE 1 TABLET EVERY 8 HOURS AS NEEDED 90 tablet 11   levothyroxine (SYNTHROID) 75 MCG tablet TAKE 1 TABLET BY MOUTH DAILY BEFORE BREAKFAST. 90 tablet 0   meclizine (ANTIVERT) 25 MG tablet Take 1 tablet (25 mg total) by mouth 3 (three) times daily as needed for dizziness. 30 tablet 1   ondansetron (ZOFRAN-ODT) 8 MG disintegrating tablet Take 1 tablet (8 mg total) by mouth every 8 (eight) hours as needed for nausea or vomiting. 180 tablet 1   pantoprazole (PROTONIX) 40 MG tablet Take 1 tablet (40 mg total) by mouth daily. 90 tablet 2   PREMARIN 1.25 MG tablet TAKE 1 TABLET DAILY 90 tablet 3   Rimegepant Sulfate (NURTEC) 75 MG TBDP Take 75 mg by mouth daily as needed (migraine). 36 tablet 1   SUMAtriptan (IMITREX) 100 MG tablet TAKE 1 TABLET EVERY 2 HOURS AS NEEDED FOR MIGRAINE, MAY REPEAT IN 2 HOURS IF HEADACHE PERSISTS OR RECURS 9 tablet 27   traZODone (DESYREL) 50 MG tablet Take 100 mg by mouth at bedtime as needed.     TYRVAYA 0.03 MG/ACT SOLN Place 1 spray into both nostrils 2 (two) times daily.     Zavegepant HCl (ZAVZPRET) 10 MG/ACT SOLN Place 1 spray into the nose as needed (migraine). Max dose 1 pill in 24 hours 8 each 6   zonisamide (ZONEGRAN) 100 MG capsule Take 1 capsule (100 mg total) by mouth daily. 90 capsule 4   predniSONE (STERAPRED UNI-PAK 48 TAB) 10 MG (48) TBPK tablet Take as directed on packaging.  12 day taper (Patient not taking: Reported on 06/04/2022) 48 tablet 0   No current facility-administered medications for this visit.    Allergies Allergies  Allergen Reactions   Codeine Nausea And Vomiting, Other (See Comments) and Rash    Vomiting and dizziness   Solifenacin Other (See Comments)    BLURRED VISION Other reaction(s): Hallucinations Visual disturbances   Molds & Smuts Other (See Comments)    Histories Past Medical History:  Diagnosis Date   Arthritis    neck   Atrial fibrillation ([place])    Colitis    chronic    Colitis     Dental crowns present    also dental implant lower right   Family history of colon cancer    Hyperlipidemia    Hypothyroidism    Kidney stone on left side 03/27/2017   Migraines    MVP (mitral valve prolapse)    Nasal turbinate hypertrophy 04/2016   Nephrolithiasis    PONV (postoperative nausea and vomiting)    Psoriasis    TMJ syndrome    limited jaw opening   Past Surgical History:  Procedure Laterality Date   81 HOUR [place] STUDY N/A 10/30/2021   Procedure: 24 HOUR [place] STUDY;  Surgeon: Lavena Bullion, DO;  Location: [facility];  Service: Gastroenterology;  Laterality: N/A;   ABDOMINAL HYSTERECTOMY     complete   BUNIONECTOMY WITH HAMMERTOE RECONSTRUCTION Left 03/04/2016   4th/5th toes   CHOLECYSTECTOMY     COLONOSCOPY     More than 5 years. [place]  ESOPHAGEAL MANOMETRY N/A 10/30/2021   Procedure: ESOPHAGEAL MANOMETRY (EM);  Surgeon: Lavena Bullion, DO;  Location: [facility];  Service: Gastroenterology;  Laterality: N/A;  ph impendence   ESOPHAGOGASTRODUODENOSCOPY     about 5 years ago. [facility] [employer]   TMJ ARTHROPLASTY     TRIGEMINAL NERVE DECOMPRESSION     TURBINATE REDUCTION Bilateral 05/23/2016   Procedure: BILATERAL TURBINATE REDUCTION;  Surgeon: Leta Baptist, MD;  Location: [facility];  Service: ENT;  Laterality: Bilateral;   Social History   Socioeconomic History   Marital status: Married    Spouse name: Not on file   Number of children: Not on file   Years of education: Not on file   Highest education level: Not on file  Occupational History   Occupation: Education officer, [employer]    Comment: Education officer, museum  Tobacco Use   Smoking status: Never   Smokeless tobacco: Never  Vaping Use   Vaping Use: Never used  Substance and Sexual Activity   Alcohol use: No    Alcohol/week: 0.0 standard drinks of alcohol   Drug use: No   Sexual activity: Not on file  Other Topics Concern   Not on file  Social  History Narrative   Not on file   Social Determinants of Health   Financial Resource Strain: Not on file  Food Insecurity: Not on file  Transportation Needs: Not on file  Physical Activity: Not on file  Stress: Not on file  Social Connections: Not on file  Intimate Partner Violence: Not on file   Family History  Problem Relation Age of Onset   Heart disease Mother    CAD Mother    Heart disease Father    Heart failure Father    Colon cancer Father        dx early 55's    Liver disease Brother    Stomach cancer Brother    Colon cancer Brother    Liver cancer Brother    Esophageal cancer Neg Hx    Inflammatory bowel disease Neg Hx    Pancreatic cancer Neg Hx    Rectal cancer Neg Hx    I have reviewed her medical, social, and family history in detail and updated the electronic medical record as necessary.    PHYSICAL EXAMINATION  BP (!) 120/90   Pulse 75   Ht 5\' 4"  (1.626 m)   Wt 151 lb (68.5 kg)   SpO2 97%   BMI 25.92 kg/m  Wt Readings from Last 3 Encounters:  06/04/22 151 lb (68.5 kg)  05/12/22 151 lb (68.5 kg)  04/21/22 154 lb (69.9 kg)  GEN: NAD, appears stated age, doesn't appear chronically ill PSYCH: Cooperative, without pressured speech EYE: Conjunctivae pink, sclerae anicteric ENT: MMM CV: Nontachycardic RESP: No audible wheezing GI: NABS, soft, NT/ND, without rebound or guarding MSK/EXT: No significant lower extremity edema SKIN: No jaundice NEURO:  Alert & Oriented x 3, no focal deficits   REVIEW OF DATA  I reviewed the following data at the time of this encounter:  GI Procedures and Studies  June 2023 pH Impedence   June 2023 Manometry   2021 EGD - The examined esophagus was normal. The scope was withdrawn. Dilation was performed with a Maloney dilator with mild resistance at 67 Fr. The dilation site was examined following endoscope reinsertion and showed mild mucosal disruption at 18 cm, consistent with successful dilation of a subtle  upper esophageal stricture. Estimated blood loss was minimal.  Findings: - The Z-line was regular and was found 40 cm from the incisors. - The gastroesophageal flap valve was visualized endoscopically and classified as Hill Grade III (minimal fold, loose to endoscope, hiatal hernia likely). - Patchy mild inflammation characterized by erythema was found in the gastric antrum.  Biopsies were taken with a cold forceps for Helicobacter pylori testing. Estimated blood loss was minimal. The remainder of the stomach was normal appearing. Additional biopsies obtained from the gastric body to evaluate for H pylori. - The duodenal bulb, first portion of the duodenum and second portion of the duodenum were normal. Biopsies for histology were taken with a cold forceps for evaluation of celiac disease. Estimated blood loss was minimal.  Laboratory Studies  Reviewed those in epic  Imaging Studies  No relevant studies to review   ASSESSMENT  Ms. Lupien is a 57 y.o. female with a pmh significant for atrial fibrillation (ASA/diltiazem), hyperlipidemia, hypothyroidism, nephrolithiasis, family history colon cancer (father/brother), GERD, hiatal hernia, esophageal hypersensitivity.  The patient is seen today for evaluation and management of:  1. Esophageal hypersensitivity   2. Pyrosis   3. Hiatal hernia   4. Abdominal pain, epigastric   5. Constipation, unspecified constipation type    The patient is hemodynamically stable.  Clinically, the patient is continuing to experience symptoms of reflux.  She has based on pH impedance testing signs of supine reflux occurring with more evidence of esophageal hypersensitivity.  There had been thought of increasing her amitriptyline up to twice daily dosing or up to 50 mg daily but the patient never extended or did this.  I have recommended that we go ahead and initiate this.  We need to monitor her closely for any symptoms of progressive sleepiness that she already gets  sleepy with just once daily dosing of amitriptyline at night (before bedtime).  We will see if this makes any difference for some of her reflux like symptoms.  I do think however she is does have at least on her previous endoscopy, evidence of a hiatal hernia as well as some supine reflux so I do wonder that if symptoms persist, even though we do not normally think of fundoplication as a reason for treatment of esophageal hypersensitivity, I do wonder with the anatomical barrier being the hiatal hernia whether this is causing her to have some issues.  It is interesting that Gaviscon helps her somewhat.  She has not had abdominal imaging to evaluate the upper abdominal discomfort and so I am going to go ahead and proceed with an abdominal ultrasound though if this is negative and she continues to have issues we will need to consider a CT abdomen/pelvis to further evaluate and exclude anything else that could be occurring.  If the abdominal ultrasound is negative, we will also likely plan to proceed with repeat endoscopy to see what has changed over the course of the last 2-1/2 years, if anything in regards to her upper GI tract.  She will be due for colon cancer screening in 2026 due to her family history.  We will obtain some other labs today to make sure we are not missing any evidence of another etiology.  All patient questions were answered to the best of my ability, and the patient agrees to the aforementioned plan of action with follow-up as indicated.   PLAN  Laboratories as outlined below Increase to amitriptyline 50 mg nightly - May be able to increase up to 100 mg if patient able to tolerate this Continue  pantoprazole 40 mg daily - Will consider transition of PPI to Aciphex or Dexilant for a PPI switch in case this makes any difference for this patient May use Gaviscon as needed With evidence of previous hiatal hernia query the role of hiatal hernia repair and partial fundoplication if reflux  symptoms persist in the setting of her pH study showing evidence of supine acid reflux (and when she is having her symptoms) Abdominal ultrasound to be obtained If workup does not show evidence of improvement as noted above then we will need to repeat EGD and consider CT abdomen/pelvis Colonoscopy for screening in 2026  Orders Placed This Encounter  Procedures   US Abdomen Complete   CBC   Comp Met (CMET)   Calcium, ionized   Lipase   TSH   Tissue transglutaminase, IgA   IgA    New Prescriptions   AMITRIPTYLINE (ELAVIL) 50 MG TABLET    Take 1 tablet (50 mg total) by mouth at bedtime.   Modified Medications   No medications on file    Planned Follow Up Return in about 3 months (around 09/03/2022).   Total Time in Face-to-Face and in Coordination of Care for patient including independent/personal interpretation/review of prior testing, medical history, examination, medication adjustment, communicating results with the patient directly, and documentation within the EHR is 30 minutes.   Justice Britain, MD [facility]oscopy Office # [id number]

## 2022-06-05 ENCOUNTER — Encounter: Payer: Self-pay | Admitting: Gastroenterology

## 2022-06-05 DIAGNOSIS — K5900 Constipation, unspecified: Secondary | ICD-10-CM | POA: Insufficient documentation

## 2022-06-05 DIAGNOSIS — R1013 Epigastric pain: Secondary | ICD-10-CM | POA: Insufficient documentation

## 2022-06-05 LAB — TISSUE TRANSGLUTAMINASE, IGA: (tTG) Ab, IgA: <1 U/mL

## 2022-06-05 LAB — IGA: Immunoglobulin A: 159 mg/dL (ref 47–310)

## 2022-06-05 LAB — CALCIUM, IONIZED: Calcium, Ion: 4.8 mg/dL (ref 4.7–5.5)

## 2022-06-13 ENCOUNTER — Telehealth: Payer: Self-pay | Admitting: Gastroenterology

## 2022-06-13 NOTE — Telephone Encounter (Signed)
Inbound call from patient stating that she has been in contact with [facility] and they advised her that she will not be able to get medication ( ELAVIL) until they hear from our office. [facility] told patient they had faxed a request to our office multiple times. Patient provided me with a contact number [phone] to contact if we need to. Patient is requesting a call back to discuss. Please advise.

## 2022-06-13 NOTE — Telephone Encounter (Signed)
Returned call to [facility]. Per pharmacist they recd what they needed. Prescription for Elavil was mailed out to patient this afternoon.

## 2022-06-17 ENCOUNTER — Encounter: Payer: Self-pay | Admitting: Gastroenterology

## 2022-06-17 ENCOUNTER — Telehealth: Payer: Self-pay | Admitting: Gastroenterology

## 2022-06-17 NOTE — Telephone Encounter (Signed)
Inbound call from patient stating she was supposed to be getting a call to schedule a ultrasound and had not heard anything about scheduling. I provided her with the number [phone] to call to follow up with scheduling that.   Patient also stated that Dr. Rush Landmark advised her to take  Elavil 50 mg tablet and she has been taking it for the last 2 weeks and stated she has not seen any change. Patient stated she's not sure if she should be seeing a change now or if she needs to continue to take it longer to see if there is a change. Patient is requesting a call back to discuss. Please advise.

## 2022-06-18 NOTE — Telephone Encounter (Signed)
Patient has been scheduled for ultrasound 06/24/22.   Please see message. Patient also sent my chart message.

## 2022-06-18 NOTE — Telephone Encounter (Signed)
Thank you for update. I will reply to the MyChart message. GM

## 2022-06-19 ENCOUNTER — Encounter: Payer: Self-pay | Admitting: Family Medicine

## 2022-06-19 LAB — VITAMIN B12: VITAMIN B12: 725

## 2022-06-19 LAB — C-REACTIVE PROTEIN: CRP: 5

## 2022-06-24 ENCOUNTER — Ambulatory Visit (HOSPITAL_COMMUNITY)
Admission: RE | Admit: 2022-06-24 | Discharge: 2022-06-24 | Disposition: A | Discharge status: Home / Self Care | Payer: BC Managed Care – PPO | Source: Ambulatory Visit | Attending: Gastroenterology | Admitting: Gastroenterology

## 2022-06-24 DIAGNOSIS — K219 Gastro-esophageal reflux disease without esophagitis: Secondary | ICD-10-CM | POA: Diagnosis not present

## 2022-06-24 DIAGNOSIS — K5900 Constipation, unspecified: Secondary | ICD-10-CM | POA: Insufficient documentation

## 2022-06-24 DIAGNOSIS — R1013 Epigastric pain: Secondary | ICD-10-CM | POA: Diagnosis not present

## 2022-06-24 DIAGNOSIS — R109 Unspecified abdominal pain: Secondary | ICD-10-CM | POA: Diagnosis not present

## 2022-06-24 DIAGNOSIS — K449 Diaphragmatic hernia without obstruction or gangrene: Secondary | ICD-10-CM | POA: Insufficient documentation

## 2022-06-24 DIAGNOSIS — R12 Heartburn: Secondary | ICD-10-CM | POA: Diagnosis not present

## 2022-06-25 ENCOUNTER — Other Ambulatory Visit: Payer: Self-pay

## 2022-06-25 DIAGNOSIS — R1013 Epigastric pain: Secondary | ICD-10-CM

## 2022-07-16 ENCOUNTER — Ambulatory Visit (HOSPITAL_COMMUNITY): Payer: BC Managed Care – PPO

## 2022-07-16 DIAGNOSIS — Z79899 Other long term (current) drug therapy: Secondary | ICD-10-CM | POA: Diagnosis not present

## 2022-07-16 DIAGNOSIS — Z049 Encounter for examination and observation for unspecified reason: Secondary | ICD-10-CM | POA: Diagnosis not present

## 2022-07-16 DIAGNOSIS — G43719 Chronic migraine without aura, intractable, without status migrainosus: Secondary | ICD-10-CM | POA: Diagnosis not present

## 2022-08-06 ENCOUNTER — Ambulatory Visit (HOSPITAL_COMMUNITY)
Admission: RE | Admit: 2022-08-06 | Discharge: 2022-08-06 | Disposition: A | Discharge status: Home / Self Care | Payer: BC Managed Care – PPO | Source: Ambulatory Visit | Attending: Gastroenterology | Admitting: Gastroenterology

## 2022-08-06 ENCOUNTER — Other Ambulatory Visit: Payer: Self-pay | Admitting: Gastroenterology

## 2022-08-06 ENCOUNTER — Other Ambulatory Visit: Payer: Self-pay

## 2022-08-06 ENCOUNTER — Encounter (HOSPITAL_COMMUNITY): Payer: Self-pay

## 2022-08-06 DIAGNOSIS — N132 Hydronephrosis with renal and ureteral calculous obstruction: Secondary | ICD-10-CM | POA: Diagnosis not present

## 2022-08-06 DIAGNOSIS — R1013 Epigastric pain: Secondary | ICD-10-CM | POA: Diagnosis not present

## 2022-08-06 DIAGNOSIS — N281 Cyst of kidney, acquired: Secondary | ICD-10-CM | POA: Diagnosis not present

## 2022-08-06 DIAGNOSIS — D3502 Benign neoplasm of left adrenal gland: Secondary | ICD-10-CM | POA: Insufficient documentation

## 2022-08-06 DIAGNOSIS — R319 Hematuria, unspecified: Secondary | ICD-10-CM | POA: Diagnosis not present

## 2022-08-06 DIAGNOSIS — R109 Unspecified abdominal pain: Secondary | ICD-10-CM | POA: Diagnosis not present

## 2022-08-06 MED ORDER — IOHEXOL 300 MG/ML  SOLN
100.0000 mL | Freq: Once | INTRAMUSCULAR | Status: AC | PRN
  Administered 2022-08-06: 100 mL via INTRAVENOUS

## 2022-08-06 MED ORDER — SODIUM CHLORIDE (PF) 0.9 % IJ SOLN
INTRAMUSCULAR | Status: AC
  Filled 2022-08-06: qty 50

## 2022-08-08 ENCOUNTER — Telehealth: Payer: BC Managed Care – PPO | Admitting: Adult Health

## 2022-08-11 ENCOUNTER — Encounter: Payer: Self-pay | Admitting: Gastroenterology

## 2022-08-11 ENCOUNTER — Other Ambulatory Visit: Payer: Self-pay

## 2022-08-11 ENCOUNTER — Other Ambulatory Visit (INDEPENDENT_AMBULATORY_CARE_PROVIDER_SITE_OTHER): Payer: BC Managed Care – PPO

## 2022-08-11 DIAGNOSIS — N1330 Unspecified hydronephrosis: Secondary | ICD-10-CM

## 2022-08-11 LAB — BASIC METABOLIC PANEL
BUN: 14 mg/dL (ref 6–23)
CO2: 27 mEq/L (ref 19–32)
Calcium: 9.1 mg/dL (ref 8.4–10.5)
Chloride: 101 mEq/L (ref 96–112)
Creatinine, Ser: 0.91 mg/dL (ref 0.40–1.20)
GFR: 70.1 mL/min (ref >60.00)
Glucose, Bld: 87 mg/dL (ref 70–99)
Potassium: 3.5 mEq/L (ref 3.5–5.1)
Sodium: 138 mEq/L (ref 135–145)

## 2022-08-11 MED ORDER — PANTOPRAZOLE SODIUM 40 MG PO TBEC: 40.0000 mg | DELAYED_RELEASE_TABLET | Freq: Every day | ORAL | 2 refills | Status: DC

## 2022-08-13 ENCOUNTER — Other Ambulatory Visit: Payer: Self-pay

## 2022-08-13 MED ORDER — ZONISAMIDE 100 MG PO CAPS: 100.0000 mg | ORAL_CAPSULE | Freq: Every day | ORAL | 2 refills | Status: DC

## 2022-08-18 ENCOUNTER — Other Ambulatory Visit: Payer: Self-pay | Admitting: Urology

## 2022-08-18 DIAGNOSIS — N201 Calculus of ureter: Secondary | ICD-10-CM | POA: Diagnosis not present

## 2022-08-19 ENCOUNTER — Encounter (HOSPITAL_BASED_OUTPATIENT_CLINIC_OR_DEPARTMENT_OTHER): Payer: Self-pay | Admitting: Urology

## 2022-08-19 NOTE — Progress Notes (Signed)
Patient contacted and instructed per Lithotripsy protocol. Patient unsure of pain meds that she can tolerate. Encouraged her to try to find out about meds she can/cannot tolerate. Informed her that we usually give fentanyl and versed for procedure as well as valium, benadryl and cipro.  Encouraged her to call [facility] if symptoms/pain is not tolerable. Instructed to arrive at 0915 and NPO after midnight. Pt verbalized understanding.

## 2022-08-25 ENCOUNTER — Ambulatory Visit (HOSPITAL_BASED_OUTPATIENT_CLINIC_OR_DEPARTMENT_OTHER): Admission: RE | Admit: 2022-08-25 | Payer: BC Managed Care – PPO | Source: Ambulatory Visit | Admitting: Urology

## 2022-08-25 ENCOUNTER — Encounter (HOSPITAL_BASED_OUTPATIENT_CLINIC_OR_DEPARTMENT_OTHER): Admission: RE | Payer: Self-pay | Source: Ambulatory Visit

## 2022-08-25 DIAGNOSIS — N201 Calculus of ureter: Secondary | ICD-10-CM | POA: Diagnosis not present

## 2022-08-25 SURGERY — LITHOTRIPSY, ESWL
Anesthesia: LOCAL | Laterality: Left

## 2022-08-26 ENCOUNTER — Other Ambulatory Visit: Payer: Self-pay | Admitting: Family Medicine

## 2022-09-03 DIAGNOSIS — H18523 Epithelial (juvenile) corneal dystrophy, bilateral: Secondary | ICD-10-CM | POA: Diagnosis not present

## 2022-09-03 DIAGNOSIS — H43393 Other vitreous opacities, bilateral: Secondary | ICD-10-CM | POA: Diagnosis not present

## 2022-09-03 DIAGNOSIS — H2513 Age-related nuclear cataract, bilateral: Secondary | ICD-10-CM | POA: Diagnosis not present

## 2022-09-12 ENCOUNTER — Other Ambulatory Visit: Payer: Self-pay | Admitting: Family Medicine

## 2022-09-12 MED ORDER — IBUPROFEN 800 MG PO TABS: 800.0000 mg | ORAL_TABLET | Freq: Three times a day (TID) | ORAL | 3 refills | Status: DC | PRN

## 2022-09-19 ENCOUNTER — Encounter: Payer: Self-pay | Admitting: Emergency Medicine

## 2022-09-19 ENCOUNTER — Other Ambulatory Visit: Payer: Self-pay

## 2022-09-19 ENCOUNTER — Ambulatory Visit
Admission: EM | Admit: 2022-09-19 | Discharge: 2022-09-19 | Disposition: A | Discharge status: Home / Self Care | Payer: BC Managed Care – PPO | Attending: Family Medicine | Admitting: Family Medicine

## 2022-09-19 DIAGNOSIS — B353 Tinea pedis: Secondary | ICD-10-CM

## 2022-09-19 MED ORDER — DOXYCYCLINE HYCLATE 100 MG PO CAPS: ORAL_CAPSULE | ORAL | 0 refills | Status: DC

## 2022-09-19 MED ORDER — KETOCONAZOLE 2 % EX CREA: 1.0000 | TOPICAL_CREAM | Freq: Every day | CUTANEOUS | 0 refills | Status: AC

## 2022-09-19 NOTE — ED Provider Notes (Signed)
[facility]    CSN: [id number] Arrival date & time: 09/19/22  1708      History   Chief Complaint Chief Complaint  Patient presents with   Toe Pain    HPI Christine Hendrix is a 57 y.o. female.   HPI  Past Medical History:  Diagnosis Date   Arthritis    neck   Atrial fibrillation (HCC)    Colitis    chronic    Colitis    Dental crowns present    also dental implant lower right   Family history of colon cancer    Hyperlipidemia    Hypothyroidism    Kidney stone on left side 03/27/2017   Migraines    MVP (mitral valve prolapse)    Nasal turbinate hypertrophy 04/2016   Nephrolithiasis    PONV (postoperative nausea and vomiting)    Psoriasis    TMJ syndrome    limited jaw opening    Patient Active Problem List   Diagnosis Date Noted   Constipation 06/05/2022   Abdominal pain, epigastric 06/05/2022   Arthralgia 05/19/2022   Prediabetes 05/12/2022   Dysphagia    Regurgitation of food    Pyrosis    Nonerosive esophageal reflux disease    Psoriasis 04/28/2021   Left lumbar radiculopathy 03/17/2021   Abnormal weight gain 03/03/2021   Nasal septum ulceration 03/03/2021   Surgical menopause on hormone replacement therapy 10/21/2019   Right lower quadrant abdominal pain 04/18/2019   Proteinuria 02/21/2019   History of abnormal cervical Pap smear 01/14/2019   Epistaxis 06/11/2018   Nasal sinus congestion 05/13/2018   Snoring 05/13/2018   PAF (paroxysmal atrial fibrillation) (HCC) 02/26/2018   H/O: hysterectomy 12/04/2017   Women's annual routine gynecological examination 12/04/2017   Family history of colon cancer in father 09/18/2017   Postmenopausal HRT (hormone replacement therapy) 10/24/2016   Allergic rhinitis 10/08/2016   Adrenal mass, left (HCC) 05/15/2015   Mixed incontinence 05/06/2015   Metatarsal deformity 08/25/2014   Acquired hammer toes of both feet 08/25/2014   Intractable chronic migraine without aura and with status migrainosus  02/24/2014   Hypothyroidism 09/02/2013   Neck pain 09/02/2013   Restless legs syndrome 09/02/2013   Insomnia 09/02/2013   Nonrheumatic mitral valve prolapse 05/30/2011    Past Surgical History:  Procedure Laterality Date   24 HOUR PH STUDY N/A 10/30/2021   Procedure: 24 HOUR PH STUDY;  Surgeon: Shellia Cleverly, DO;  Location: [facility];  Service: Gastroenterology;  Laterality: N/A;   ABDOMINAL HYSTERECTOMY     complete   BUNIONECTOMY WITH HAMMERTOE RECONSTRUCTION Left 03/04/2016   4th/5th toes   CHOLECYSTECTOMY     COLONOSCOPY     More than 5 years. [facility]. [facility]ier    ESOPHAGEAL MANOMETRY N/A 10/30/2021   Procedure: ESOPHAGEAL MANOMETRY (EM);  Surgeon: Shellia Cleverly, DO;  Location: [facility];  Service: Gastroenterology;  Laterality: N/A;  ph impendence   ESOPHAGOGASTRODUODENOSCOPY     about 5 years ago. [facility] [employer]   TMJ ARTHROPLASTY     TRIGEMINAL NERVE DECOMPRESSION     TURBINATE REDUCTION Bilateral 05/23/2016   Procedure: BILATERAL TURBINATE REDUCTION;  Surgeon: Newman Pies, MD;  Location: [facility];  Service: ENT;  Laterality: Bilateral;    OB History   No obstetric history on file.      Home Medications    Prior to Admission medications   Medication Sig Start Date End Date Taking? Authorizing Provider  doxycycline (VIBRAMYCIN)  100 MG capsule Take one cap PO Q12hr with food. 09/19/22  Yes Lattie Haw, MD  ketoconazole (NIZORAL) 2 % cream Apply 1 Application topically daily. Use for two weeks 09/19/22  Yes Lattie Haw, MD  AIMOVIG 140 MG/ML SOAJ Inject 140 mg into the skin every 30 (thirty) days. 04/21/22   Ocie Doyne, MD  amitriptyline (ELAVIL) 50 MG tablet Take 1 tablet (50 mg total) by mouth at bedtime. 06/04/22   Mansouraty, Netty Starring., MD  aspirin EC 81 MG tablet Take 1 tablet (81 mg total) by mouth daily. 03/24/18   Lewayne Bunting, MD  chlorzoxazone (PARAFON) 500 MG tablet Take 500 mg by  mouth 4 (four) times daily as needed for muscle spasms. 02/13/22   [provider]  Clobetasol Propionate (TEMOVATE) 0.05 % external spray Apply topically 2 (two) times daily. 12/25/21   Everrett Coombe, DO  diltiazem (CARDIZEM CD) 120 MG 24 hr capsule Take 1 capsule (120 mg total) by mouth daily. 09/22/18 08/19/22  Lewayne Bunting, MD  famotidine (PEPCID) 20 MG tablet TAKE 1 TABLET BY MOUTH EVERYDAY AT BEDTIME 11/15/21   Cirigliano, Vito V, DO  hyoscyamine (ANASPAZ) 0.125 MG TBDP disintergrating tablet Place 0.125 mg under the tongue every 6 (six) hours as needed.    [provider]  ibuprofen (ADVIL) 800 MG tablet Take 1 tablet (800 mg total) by mouth every 8 (eight) hours as needed. 09/12/22   Everrett Coombe, DO  levothyroxine (SYNTHROID) 75 MCG tablet TAKE 1 TABLET BY MOUTH EVERY DAY BEFORE BREAKFAST 08/26/22   Everrett Coombe, DO  meclizine (ANTIVERT) 25 MG tablet Take 1 tablet (25 mg total) by mouth 3 (three) times daily as needed for dizziness. 02/20/21   Everrett Coombe, DO  Multiple Vitamin (MULTIVITAMIN WITH MINERALS) TABS tablet Take 1 tablet by mouth daily.    [provider]  ondansetron (ZOFRAN-ODT) 8 MG disintegrating tablet Take 1 tablet (8 mg total) by mouth every 8 (eight) hours as needed for nausea or vomiting. 02/10/22   Everrett Coombe, DO  pantoprazole (PROTONIX) 40 MG tablet Take 1 tablet (40 mg total) by mouth daily. 08/11/22   Cirigliano, Vito V, DO  predniSONE (STERAPRED UNI-PAK 48 TAB) 10 MG (48) TBPK tablet Take as directed on packaging.  12 day taper Patient not taking: Reported on 06/04/2022 05/13/22   Everrett Coombe, DO  PREMARIN 1.25 MG tablet TAKE 1 TABLET DAILY 12/05/21   Everrett Coombe, DO  Rimegepant Sulfate (NURTEC) 75 MG TBDP Take 75 mg by mouth daily as needed (migraine). 05/23/22   Christen Butter, NP  SUMAtriptan (IMITREX) 100 MG tablet TAKE 1 TABLET EVERY 2 HOURS AS NEEDED FOR MIGRAINE, MAY REPEAT IN 2 HOURS IF HEADACHE PERSISTS OR RECURS 08/19/21    Everrett Coombe, DO  tamsulosin (FLOMAX) 0.4 MG CAPS capsule Take 0.4 mg by mouth daily. At bedtime    [provider]  traZODone (DESYREL) 50 MG tablet Take 100 mg by mouth at bedtime as needed. 12/24/21   [provider]  TYRVAYA 0.03 MG/ACT SOLN Place 1 spray into both nostrils 2 (two) times daily. 05/02/22   [provider]  Zavegepant HCl (ZAVZPRET) 10 MG/ACT SOLN Place 1 spray into the nose as needed (migraine). Max dose 1 pill in 24 hours 04/21/22   Ocie Doyne, MD  zonisamide (ZONEGRAN) 100 MG capsule Take 1 capsule (100 mg total) by mouth daily. 08/13/22   Ocie Doyne, MD    Family History Family History  Problem Relation Age of Onset  Heart disease Mother    CAD Mother    Heart disease Father    Heart failure Father    Colon cancer Father        dx early 55's    Liver disease Brother    Stomach cancer Brother    Colon cancer Brother    Liver cancer Brother    Esophageal cancer Neg Hx    Inflammatory bowel disease Neg Hx    Pancreatic cancer Neg Hx    Rectal cancer Neg Hx     Social History Social History   Tobacco Use   Smoking status: Never   Smokeless tobacco: Never  Vaping Use   Vaping Use: Never used  Substance Use Topics   Alcohol use: No    Alcohol/week: 0.0 standard drinks of alcohol   Drug use: No     Allergies   Codeine, Solifenacin, and Molds & smuts   Review of Systems Review of Systems   Physical Exam Triage Vital Signs ED Triage Vitals [09/19/22 1736]  Enc Vitals Group     BP (!) 149/85     Pulse Rate 70     Resp 18     Temp 98 F (36.7 C)     Temp Source Oral     SpO2 100 %     Weight      Height      Head Circumference      Peak Flow      Pain Score      Pain Loc      Pain Edu?      Excl. in GC?    No data found.  Updated Vital Signs BP (!) 149/85   Pulse 70   Temp 98 F (36.7 C) (Oral)   Resp 18   SpO2 100%   Visual Acuity Right Eye Distance:   Left Eye Distance:   Bilateral  Distance:    Right Eye Near:   Left Eye Near:    Bilateral Near:     Physical Exam   UC Treatments / Results  Labs (all labs ordered are listed, but only abnormal results are displayed) Labs Reviewed - No data to display  EKG   Radiology No results found.  Procedures Procedures (including critical care time)  Medications Ordered in UC Medications - No data to display  Initial Impression / Assessment and Plan / UC Course  I have reviewed the triage vital signs and the nursing notes.  Pertinent labs & imaging results that were available during my care of the patient were reviewed by me and considered in my medical decision making (see chart for details).    Suspect mild concurrent cellulitis; begin doxycycline 100mg  Q12hr for 5 days. Begin Nizoral 2% cream Qday for 2 weeks. Followup with Family Doctor if not improved in two weeks.  Final Clinical Impressions(s) / UC Diagnoses   Final diagnoses:  Tinea pedis of right foot     Discharge Instructions      Do not scratch your feet. Keep your feet dry: Wear cotton or wool socks. Change your socks every day or if they become wet. Wear shoes that allow air to flow, such as sandals or canvas tennis shoes. Wash and dry your feet, including the area between your toes. Also, wash and dry your feet: Every day or as told by your health care provider. After exercising.    ED Prescriptions     Medication Sig Dispense Auth. Provider   doxycycline (VIBRAMYCIN) 100 MG  capsule Take one cap PO Q12hr with food. 8 capsule Lattie Haw, MD   ketoconazole (NIZORAL) 2 % cream Apply 1 Application topically daily. Use for two weeks 30 g Lattie Haw, MD      PDMP not reviewed this encounter.

## 2022-09-19 NOTE — Discharge Instructions (Signed)
Do not scratch your feet. Keep your feet dry: Wear cotton or wool socks. Change your socks every day or if they become wet. Wear shoes that allow air to flow, such as sandals or canvas tennis shoes. Wash and dry your feet, including the area between your toes. Also, wash and dry your feet: Every day or as told by your health care provider. After exercising.

## 2022-09-19 NOTE — ED Triage Notes (Signed)
Patient presents to [facility] for evaluation of ulcer between her two little toes on her right foot.  Patient has been experiencing pain for a few months, but had her husband look at it last night.

## 2022-09-23 DIAGNOSIS — G43719 Chronic migraine without aura, intractable, without status migrainosus: Secondary | ICD-10-CM | POA: Diagnosis not present

## 2022-09-25 DIAGNOSIS — H16213 Exposure keratoconjunctivitis, bilateral: Secondary | ICD-10-CM | POA: Diagnosis not present

## 2022-10-03 ENCOUNTER — Telehealth: Payer: BC Managed Care – PPO | Admitting: Adult Health

## 2022-10-16 DIAGNOSIS — L821 Other seborrheic keratosis: Secondary | ICD-10-CM | POA: Diagnosis not present

## 2022-10-16 DIAGNOSIS — R351 Nocturia: Secondary | ICD-10-CM | POA: Diagnosis not present

## 2022-10-16 DIAGNOSIS — N201 Calculus of ureter: Secondary | ICD-10-CM | POA: Diagnosis not present

## 2022-10-16 DIAGNOSIS — L408 Other psoriasis: Secondary | ICD-10-CM | POA: Diagnosis not present

## 2022-10-16 DIAGNOSIS — Z79899 Other long term (current) drug therapy: Secondary | ICD-10-CM | POA: Diagnosis not present

## 2022-10-23 DIAGNOSIS — N200 Calculus of kidney: Secondary | ICD-10-CM | POA: Diagnosis not present

## 2022-10-23 DIAGNOSIS — K5730 Diverticulosis of large intestine without perforation or abscess without bleeding: Secondary | ICD-10-CM | POA: Diagnosis not present

## 2022-10-25 ENCOUNTER — Other Ambulatory Visit: Payer: Self-pay | Admitting: Gastroenterology

## 2022-10-28 DIAGNOSIS — I4891 Unspecified atrial fibrillation: Secondary | ICD-10-CM | POA: Diagnosis not present

## 2022-10-28 DIAGNOSIS — R0600 Dyspnea, unspecified: Secondary | ICD-10-CM | POA: Diagnosis not present

## 2022-10-28 DIAGNOSIS — H18523 Epithelial (juvenile) corneal dystrophy, bilateral: Secondary | ICD-10-CM | POA: Diagnosis not present

## 2022-10-28 DIAGNOSIS — H43393 Other vitreous opacities, bilateral: Secondary | ICD-10-CM | POA: Diagnosis not present

## 2022-10-28 DIAGNOSIS — Z1331 Encounter for screening for depression: Secondary | ICD-10-CM | POA: Diagnosis not present

## 2022-10-28 DIAGNOSIS — H2513 Age-related nuclear cataract, bilateral: Secondary | ICD-10-CM | POA: Diagnosis not present

## 2022-10-28 DIAGNOSIS — I341 Nonrheumatic mitral (valve) prolapse: Secondary | ICD-10-CM | POA: Diagnosis not present

## 2022-11-14 ENCOUNTER — Ambulatory Visit: Payer: BC Managed Care – PPO | Admitting: Family Medicine

## 2022-11-17 DIAGNOSIS — G43719 Chronic migraine without aura, intractable, without status migrainosus: Secondary | ICD-10-CM | POA: Diagnosis not present

## 2022-11-18 DIAGNOSIS — M25562 Pain in left knee: Secondary | ICD-10-CM | POA: Diagnosis not present

## 2022-11-18 DIAGNOSIS — M25561 Pain in right knee: Secondary | ICD-10-CM | POA: Diagnosis not present

## 2022-11-18 DIAGNOSIS — M79641 Pain in right hand: Secondary | ICD-10-CM | POA: Diagnosis not present

## 2022-11-18 DIAGNOSIS — M542 Cervicalgia: Secondary | ICD-10-CM | POA: Diagnosis not present

## 2022-11-18 DIAGNOSIS — L4050 Arthropathic psoriasis, unspecified: Secondary | ICD-10-CM | POA: Diagnosis not present

## 2022-11-18 DIAGNOSIS — H18522 Epithelial (juvenile) corneal dystrophy, left eye: Secondary | ICD-10-CM | POA: Diagnosis not present

## 2022-11-18 DIAGNOSIS — M1990 Unspecified osteoarthritis, unspecified site: Secondary | ICD-10-CM | POA: Diagnosis not present

## 2022-11-18 DIAGNOSIS — M5136 Other intervertebral disc degeneration, lumbar region: Secondary | ICD-10-CM | POA: Diagnosis not present

## 2022-11-18 DIAGNOSIS — M79642 Pain in left hand: Secondary | ICD-10-CM | POA: Diagnosis not present

## 2022-11-22 ENCOUNTER — Encounter: Payer: Self-pay | Admitting: Family Medicine

## 2022-12-05 ENCOUNTER — Ambulatory Visit: Payer: BC Managed Care – PPO | Admitting: Family Medicine

## 2022-12-10 DIAGNOSIS — M25572 Pain in left ankle and joints of left foot: Secondary | ICD-10-CM | POA: Diagnosis not present

## 2022-12-10 DIAGNOSIS — M79672 Pain in left foot: Secondary | ICD-10-CM | POA: Diagnosis not present

## 2022-12-10 DIAGNOSIS — M5136 Other intervertebral disc degeneration, lumbar region: Secondary | ICD-10-CM | POA: Diagnosis not present

## 2022-12-10 DIAGNOSIS — M542 Cervicalgia: Secondary | ICD-10-CM | POA: Diagnosis not present

## 2022-12-10 DIAGNOSIS — L4050 Arthropathic psoriasis, unspecified: Secondary | ICD-10-CM | POA: Diagnosis not present

## 2022-12-10 DIAGNOSIS — M1990 Unspecified osteoarthritis, unspecified site: Secondary | ICD-10-CM | POA: Diagnosis not present

## 2022-12-10 DIAGNOSIS — M79671 Pain in right foot: Secondary | ICD-10-CM | POA: Diagnosis not present

## 2022-12-10 DIAGNOSIS — M25571 Pain in right ankle and joints of right foot: Secondary | ICD-10-CM | POA: Diagnosis not present

## 2022-12-20 ENCOUNTER — Encounter: Payer: Self-pay | Admitting: Family Medicine

## 2022-12-21 ENCOUNTER — Other Ambulatory Visit: Payer: Self-pay | Admitting: Family Medicine

## 2022-12-23 NOTE — Telephone Encounter (Signed)
I don't think she needs it for daily use.

## 2022-12-25 ENCOUNTER — Telehealth (INDEPENDENT_AMBULATORY_CARE_PROVIDER_SITE_OTHER): Payer: BC Managed Care – PPO | Admitting: Family Medicine

## 2022-12-25 ENCOUNTER — Encounter: Payer: Self-pay | Admitting: Family Medicine

## 2022-12-25 VITALS — Temp 98.0°F | Ht 64.0 in | Wt 144.6 lb

## 2022-12-25 DIAGNOSIS — L409 Psoriasis, unspecified: Secondary | ICD-10-CM

## 2022-12-25 DIAGNOSIS — L4050 Arthropathic psoriasis, unspecified: Secondary | ICD-10-CM | POA: Diagnosis not present

## 2022-12-25 DIAGNOSIS — E039 Hypothyroidism, unspecified: Secondary | ICD-10-CM | POA: Diagnosis not present

## 2022-12-25 DIAGNOSIS — R5383 Other fatigue: Secondary | ICD-10-CM

## 2022-12-28 MED ORDER — ONDANSETRON 8 MG PO TBDP: 8.0000 mg | ORAL_TABLET | Freq: Three times a day (TID) | ORAL | 1 refills | Status: DC | PRN

## 2022-12-28 MED ORDER — PANTOPRAZOLE SODIUM 40 MG PO TBEC: 40.0000 mg | DELAYED_RELEASE_TABLET | Freq: Every day | ORAL | 3 refills | Status: DC

## 2022-12-28 MED ORDER — MECLIZINE HCL 25 MG PO TABS: 25.0000 mg | ORAL_TABLET | Freq: Three times a day (TID) | ORAL | 1 refills | Status: DC | PRN

## 2022-12-28 MED ORDER — GABAPENTIN 300 MG PO CAPS: 300.0000 mg | ORAL_CAPSULE | Freq: Every day | ORAL | 3 refills | Status: DC

## 2022-12-28 MED ORDER — LEVOTHYROXINE SODIUM 75 MCG PO TABS: 75.0000 ug | ORAL_TABLET | Freq: Every day | ORAL | 1 refills | Status: DC

## 2022-12-28 MED ORDER — ESTROGENS CONJUGATED 1.25 MG PO TABS: 1.2500 mg | ORAL_TABLET | Freq: Every day | ORAL | 3 refills | Status: DC

## 2022-12-30 ENCOUNTER — Telehealth: Payer: Self-pay

## 2022-12-30 DIAGNOSIS — L4050 Arthropathic psoriasis, unspecified: Secondary | ICD-10-CM | POA: Insufficient documentation

## 2022-12-30 NOTE — Assessment & Plan Note (Signed)
Continue current strength of levothyroxine.

## 2022-12-30 NOTE — Progress Notes (Signed)
Christine Hendrix - 57 y.o. female MRN [id number]  Date of birth: 11-14-1965   This visit type was conducted due to national recommendations for restrictions regarding the COVID-19 Pandemic (e.g. social distancing).  This format is felt to be most appropriate for this patient at this time.  All issues noted in this document were discussed and addressed.  No physical exam was performed (except for noted visual exam findings with Video Visits).  I discussed the limitations of evaluation and management by telemedicine and the availability of in person appointments. The patient expressed understanding and agreed to proceed.  I connected withNAME@ on 12/30/22 at 11:30 AM EDT by a video enabled telemedicine application and verified that I am speaking with the correct person using two identifiers.  Present at visit: Everrett Coombe, DO [employer]   Patient Location: Home [address] [place] [address]   Provider location:   [facility]  Chief Complaint  Patient presents with   Medication Refill    HPI  Christine Hendrix is a 57 y.o. female who presents via audio/video conferencing for a telehealth visit today.    She needs refills on medications.  Continues on levothyroxine and reports that she feels pretty well with this. She is seeing neurology for management of her migraines.  She has been receiving prescription for Zofran as needed from our clinic.  Needs renewal of this.  She has been treated for psoriatic arthritis and her provider has recommended trial of Humira.  She is unsure about this.   ROS:  A comprehensive ROS was completed and negative except as noted per HPI  Past Medical History:  Diagnosis Date   Arthritis    neck   Atrial fibrillation (HCC)    Colitis    chronic    Colitis    Dental crowns present    also dental implant lower right   Family history of colon cancer    Hyperlipidemia    Hypothyroidism    Kidney stone on left side 03/27/2017   Migraines    MVP (mitral  valve prolapse)    Nasal turbinate hypertrophy 04/2016   Nephrolithiasis    PONV (postoperative nausea and vomiting)    Psoriasis    TMJ syndrome    limited jaw opening    Past Surgical History:  Procedure Laterality Date   31 HOUR PH STUDY N/A 10/30/2021   Procedure: 24 HOUR PH STUDY;  Surgeon: Shellia Cleverly, DO;  [facility]: [facility];  Service: Gastroenterology;  Laterality: N/A;   ABDOMINAL HYSTERECTOMY     complete   BUNIONECTOMY WITH HAMMERTOE RECONSTRUCTION Left 03/04/2016   4th/5th toes   CHOLECYSTECTOMY     COLONOSCOPY     More than 5 years. [facility]. [facility]    ESOPHAGEAL MANOMETRY N/A 10/30/2021   Procedure: ESOPHAGEAL MANOMETRY (EM);  Surgeon: Shellia Cleverly, DO;  Location: [facility];  Service: Gastroenterology;  Laterality: N/A;  ph impendence   ESOPHAGOGASTRODUODENOSCOPY     about 5 years ago. [facility] [employer]   TMJ ARTHROPLASTY     TRIGEMINAL NERVE DECOMPRESSION     TURBINATE REDUCTION Bilateral 05/23/2016   Procedure: BILATERAL TURBINATE REDUCTION;  Surgeon: Newman Pies, MD;  Location: [facility];  Service: ENT;  Laterality: Bilateral;    Family History  Problem Relation Age of Onset   Heart disease Mother    CAD Mother    Heart disease Father    Heart failure Father    Colon  cancer Father        dx early 2's    Liver disease Brother    Stomach cancer Brother    Colon cancer Brother    Liver cancer Brother    Esophageal cancer Neg Hx    Inflammatory bowel disease Neg Hx    Pancreatic cancer Neg Hx    Rectal cancer Neg Hx     Social History   Socioeconomic History   Marital status: Married    Spouse name: Not on file   Number of children: Not on file   Years of education: Not on file   Highest education level: Not on file  Occupational History   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    Comment: Child psychotherapist  Tobacco Use   Smoking status: Never   Smokeless tobacco: Never  Vaping Use    Vaping status: Never Used  Substance and Sexual Activity   Alcohol use: No    Alcohol/week: 0.0 standard drinks of alcohol   Drug use: No   Sexual activity: Not on file  Other Topics Concern   Not on file  Social History Narrative   Not on file   Social Determinants of Health   Financial Resource Strain: Low Risk  (10/28/2022)   Received from [facility], [facility]   Overall Financial Resource Strain (CARDIA)    Difficulty of Paying Living Expenses: Not hard at all  Food Insecurity: No Food Insecurity (10/28/2022)   Received from [facility], [facility]   Hunger Vital Sign    Worried About Running Out of Food in the Last Year: Never true    Ran Out of Food in the Last Year: Never true  Transportation Needs: No Transportation Needs (10/28/2022)   Received from [employer], [facility]   PRAPARE - Transportation    Lack of Transportation (Medical): No    Lack of Transportation (Non-Medical): No  Physical Activity: Insufficiently Active (02/12/2022)   Received from [facility], [facility]   Exercise Vital Sign    Days of Exercise per Week: 1 day    Minutes of Exercise per Session: 30 min  Stress: Stress Concern Present (02/12/2022)   Received from [facility], [facility] of Occupational Health - Occupational Stress Questionnaire    Feeling of Stress : To some extent  Social Connections: Moderately Integrated (02/12/2022)   Received from [facility], [facility]   Social Network    How would you rate your social network (family, work, friends)?: Adequate participation with social networks  Intimate Partner Violence: Not At Risk (02/12/2022)   Received from [facility], [facility]   HITS    Over the last 12 months how often did your partner physically hurt you?: 1    Over the last 12 months how often did your partner insult you or talk down to you?: 2    Over the last 12 months how often did your partner threaten you with physical  harm?: 1    Over the last 12 months how often did your partner scream or curse at you?: 2     Current Outpatient Medications:    AIMOVIG 140 MG/ML SOAJ, Inject 140 mg into the skin every 30 (thirty) days., Disp: 1.12 mL, Rfl: 6   aspirin EC 81 MG tablet, Take 1 tablet (81 mg total) by mouth daily., Disp: 90 tablet, Rfl: 3   diltiazem (CARDIZEM CD) 120 MG 24 hr capsule, Take 1 capsule (120 mg total) by mouth  daily., Disp: 90 capsule, Rfl: 3   gabapentin (NEURONTIN) 300 MG capsule, Take 1 capsule (300 mg total) by mouth at bedtime., Disp: 90 capsule, Rfl: 3   ibuprofen (ADVIL) 800 MG tablet, Take 1 tablet (800 mg total) by mouth every 8 (eight) hours as needed., Disp: 90 tablet, Rfl: 3   ketoconazole (NIZORAL) 2 % cream, Apply 1 Application topically daily. Use for two weeks, Disp: 30 g, Rfl: 0   loteprednol (LOTEMAX) 0.5 % ophthalmic suspension, 1 drop daily., Disp: , Rfl:    methotrexate (RHEUMATREX) 2.5 MG tablet, 4 tablets Orally once a week for 84 days, Disp: , Rfl:    Multiple Vitamin (MULTIVITAMIN WITH MINERALS) TABS tablet, Take 1 tablet by mouth daily., Disp: , Rfl:    Rimegepant Sulfate (NURTEC) 75 MG TBDP, Take 75 mg by mouth daily as needed (migraine)., Disp: 36 tablet, Rfl: 1   SUMAtriptan (IMITREX) 100 MG tablet, TAKE 1 TABLET EVERY 2 HOURS AS NEEDED FOR MIGRAINE, MAY REPEAT IN 2 HOURS IF HEADACHE PERSISTS OR RECURS, Disp: 9 tablet, Rfl: 27   tamsulosin (FLOMAX) 0.4 MG CAPS capsule, Take 0.4 mg by mouth daily. At bedtime, Disp: , Rfl:    TYRVAYA 0.03 MG/ACT SOLN, Place 1 spray into both nostrils 2 (two) times daily., Disp: , Rfl:    zonisamide (ZONEGRAN) 100 MG capsule, Take 1 capsule (100 mg total) by mouth daily., Disp: 90 capsule, Rfl: 2   chlorzoxazone (PARAFON) 500 MG tablet, Take 500 mg by mouth 4 (four) times daily as needed for muscle spasms. (Patient not taking: Reported on 12/25/2022), Disp: , Rfl:    Clobetasol Propionate (TEMOVATE) 0.05 % external spray, Apply topically 2  (two) times daily. (Patient not taking: Reported on 12/25/2022), Disp: 125 mL, Rfl: 1   estradiol (ESTRACE) 0.1 MG/GM vaginal cream, , Disp: , Rfl:    estrogens, conjugated, (PREMARIN) 1.25 MG tablet, Take 1 tablet (1.25 mg total) by mouth daily., Disp: 90 tablet, Rfl: 3   levothyroxine (SYNTHROID) 75 MCG tablet, Take 1 tablet (75 mcg total) by mouth daily before breakfast., Disp: 90 tablet, Rfl: 1   meclizine (ANTIVERT) 25 MG tablet, Take 1 tablet (25 mg total) by mouth 3 (three) times daily as needed for dizziness., Disp: 90 tablet, Rfl: 1   ondansetron (ZOFRAN-ODT) 8 MG disintegrating tablet, Take 1 tablet (8 mg total) by mouth every 8 (eight) hours as needed for nausea or vomiting., Disp: 180 tablet, Rfl: 1   pantoprazole (PROTONIX) 40 MG tablet, Take 1 tablet (40 mg total) by mouth daily., Disp: 90 tablet, Rfl: 3  EXAM:  VITALS per patient if applicable: Temp 98 F (36.7 C)   Ht 5\' 4"  (1.626 m)   Wt 144 lb 9.6 oz (65.6 kg)   BMI 24.82 kg/m   GENERAL: alert, oriented, appears well and in no acute distress  HEENT: atraumatic, conjunttiva clear, no obvious abnormalities on inspection of external nose and ears  NECK: normal movements of the head and neck  LUNGS: on inspection no signs of respiratory distress, breathing rate appears normal, no obvious gross SOB, gasping or wheezing  CV: no obvious cyanosis  MS: moves all visible extremities without noticeable abnormality  PSYCH/NEURO: pleasant and cooperative, no obvious depression or anxiety, speech and thought processing grossly intact  ASSESSMENT AND PLAN:  Discussed the following assessment and plan:  Psoriatic arthritis (HCC) Currently on methotrexate.  Considering the addition of Humira.  Will check vitamin D levels as well as iron panel and she experiencing significant fatigue.  Hypothyroidism Continue current strength of levothyroxine.     I discussed the assessment and treatment plan with the patient. The patient was  provided an opportunity to ask questions and all were answered. The patient agreed with the plan and demonstrated an understanding of the instructions.   The patient was advised to call back or seek an in-person evaluation if the symptoms worsen or if the condition fails to improve as anticipated.    Everrett Coombe, DO

## 2022-12-30 NOTE — Telephone Encounter (Addendum)
Initiated Prior authorization [id number] 8MG  dispersible tablets Via: Covermymeds Case/Key:[id number] Status: approved  as of 12/30/22 Reason:quant limit exception,Authorization Expiration Date: 06/27/2023  Notified Pt via: Mychart

## 2022-12-30 NOTE — Assessment & Plan Note (Signed)
Currently on methotrexate.  Considering the addition of Humira.  Will check vitamin D levels as well as iron panel and she experiencing significant fatigue.

## 2023-01-12 ENCOUNTER — Telehealth: Payer: Self-pay | Admitting: Gastroenterology

## 2023-01-12 NOTE — Telephone Encounter (Signed)
Patient called stated she received a bill for a CT scan she had done with Dr. Meridee Score, said they advised her the office did not submit sufficient documentation to cover the scan and she was not aware of it prior to. Please advise.

## 2023-01-16 DIAGNOSIS — L409 Psoriasis, unspecified: Secondary | ICD-10-CM | POA: Diagnosis not present

## 2023-01-16 DIAGNOSIS — R5383 Other fatigue: Secondary | ICD-10-CM | POA: Diagnosis not present

## 2023-02-18 DIAGNOSIS — G43719 Chronic migraine without aura, intractable, without status migrainosus: Secondary | ICD-10-CM | POA: Diagnosis not present

## 2023-04-01 DIAGNOSIS — L400 Psoriasis vulgaris: Secondary | ICD-10-CM | POA: Diagnosis not present

## 2023-04-01 DIAGNOSIS — L821 Other seborrheic keratosis: Secondary | ICD-10-CM | POA: Diagnosis not present

## 2023-04-08 ENCOUNTER — Telehealth: Payer: Self-pay | Admitting: Gastroenterology

## 2023-04-08 NOTE — Telephone Encounter (Signed)
Inbound call from patient stating she is experiencing abdominal pain. Patient is schedule for next available ov with provider. Requesting to speak with a nurse. Please advise.

## 2023-04-09 NOTE — Telephone Encounter (Signed)
Inbound call from patient, returning Patty's call.

## 2023-04-09 NOTE — Telephone Encounter (Signed)
Left message on machine to call back  

## 2023-04-10 ENCOUNTER — Other Ambulatory Visit: Payer: Self-pay

## 2023-04-10 MED ORDER — PANTOPRAZOLE SODIUM 40 MG PO TBEC: 40.0000 mg | DELAYED_RELEASE_TABLET | Freq: Two times a day (BID) | ORAL | 1 refills | Status: DC

## 2023-04-10 MED ORDER — SUCRALFATE 1 GM/10ML PO SUSP: 1.0000 g | Freq: Two times a day (BID) | ORAL | 0 refills | Status: AC

## 2023-04-10 NOTE — Telephone Encounter (Signed)
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 With this being said, she could either do the following and you may offer her:  1) Increase Protonix to BID dosing 2) Change her to Aciphex (as I had considered previously for PPI exchange) 3) Add Carafate liquid slurry 1 g BID for the weekend. 4) OK to continue Gaviscon with known HH.  Let me know what she decides. Thanks. GM

## 2023-04-10 NOTE — Telephone Encounter (Signed)
Beth, Can do Carafate BID to at least get her through the weekend to see if that is helpful. It can be for (60/0) to see how she does over the next few weeks. We can have an update by mid next week. Thanks. GM

## 2023-04-10 NOTE — Telephone Encounter (Signed)
Spoke with the patient. She is having increased "abdominal pain" and reflux. Taking pantoprazole daily. No missed doses. No new medications. Reports difficulty sleeping due to reflux. Taking Gaviscon most nights. Sleeps elevated. Follows reflux precautions. The patient tells me these symptoms have been ongoing and she "deals with it" but past week has been particularly bad. There are not any sooner appointments. Please advise.

## 2023-04-10 NOTE — Telephone Encounter (Signed)
Spoke with the patient. Agrees to Protonix BID dosing to see how that does. She does want the Carafate for the weekend. Asks is this only for 2 days? She will continue the Gaviscon PRN and not be as reluctant to use. She will call us back with an update next week.

## 2023-04-10 NOTE — Telephone Encounter (Signed)
Patient understands to try through the weekend. Call with update next weekend.

## 2023-04-14 ENCOUNTER — Other Ambulatory Visit (HOSPITAL_COMMUNITY): Payer: Self-pay

## 2023-04-14 ENCOUNTER — Telehealth: Payer: Self-pay

## 2023-04-14 NOTE — Telephone Encounter (Signed)
Pharmacy Patient Advocate Encounter   Received notification from CoverMyMeds that prior authorization for Zavzpret 10MG /ACT solution is required/requested.   Insurance verification completed.   The patient is insured through [employer] .   Per test claim: PA required; PA submitted to above mentioned insurance via CoverMyMeds Key/confirmation #/EOC [id number] Status is pending

## 2023-04-15 DIAGNOSIS — M79671 Pain in right foot: Secondary | ICD-10-CM | POA: Diagnosis not present

## 2023-04-15 DIAGNOSIS — L4050 Arthropathic psoriasis, unspecified: Secondary | ICD-10-CM | POA: Diagnosis not present

## 2023-04-15 DIAGNOSIS — M1990 Unspecified osteoarthritis, unspecified site: Secondary | ICD-10-CM | POA: Diagnosis not present

## 2023-04-15 DIAGNOSIS — M542 Cervicalgia: Secondary | ICD-10-CM | POA: Diagnosis not present

## 2023-04-16 ENCOUNTER — Ambulatory Visit: Payer: BC Managed Care – PPO | Admitting: Podiatry

## 2023-04-22 ENCOUNTER — Telehealth: Payer: Self-pay

## 2023-04-22 NOTE — Telephone Encounter (Signed)
Initiated Prior authorization [id number] 1.25MG  tablets Via: Covermymeds Case/Key:[id number] Status: approved  as of 04/22/23 Reason:Authorization Expiration Date: 04/20/2024 Notified Pt via: Mychart

## 2023-04-27 NOTE — Telephone Encounter (Signed)
Pharmacy Patient Advocate Encounter  Received notification from [employer] that Prior Authorization for Zavzpret 10MG /ACT solution has been APPROVED from 03/15/2023 to 04/17/2024   PA #/Case ID/Reference #: PA Case ID #: [id number]

## 2023-06-08 ENCOUNTER — Other Ambulatory Visit: Payer: Self-pay | Admitting: Family Medicine

## 2023-06-08 NOTE — Telephone Encounter (Signed)
 Requesting rx rf of levothyroxine Last written 12/28/2022 Last OV 12/25/2022 Last TSH= 06/04/2022 Upcoming appt = none

## 2023-07-10 ENCOUNTER — Ambulatory Visit: Payer: BC Managed Care – PPO | Admitting: Gastroenterology

## 2023-07-15 ENCOUNTER — Other Ambulatory Visit: Payer: Self-pay | Admitting: Gastroenterology

## 2023-07-17 ENCOUNTER — Ambulatory Visit: Payer: Self-pay | Admitting: Family Medicine

## 2023-07-17 NOTE — Telephone Encounter (Signed)
Chief Complaint: Pain in joints Symptoms: Fatigue, issues with focus Frequency: Intermittent Pertinent Negatives: Patient denies chest pain Disposition: [] ED /[] Urgent Care (no appt availability in office) / [x] Appointment(In office/virtual)/ []  [facility]/ [] Home Care/ [] Refused Recommended Disposition /[] [facility]/ []  Follow-up with PCP Additional Notes: Pt has had ongoing pain in joints, fatigue, and issues with focusing for a while. Pt scheduled for an appointment with PCP on 3/6 to discuss ongoing symptoms. This RN educated pt on home care, new-worsening symptoms, when to call back/seek emergent care. Pt verbalized understanding and agrees to plan.    Copied From CRM [id number]. Reason for Triage: Patient states she has been having some chronic pain in her joints for some time now. Patient also states she is also dealing with fatigue and she has been unable to focus.   Reason for Disposition  Muscle aches are a chronic symptom (recurrent or ongoing AND present > 4 weeks)  Answer Assessment - Initial Assessment Questions 1. ONSET: "When did the muscle aches or body pains start?"      A while 2. LOCATION: "What part of your body is hurting?" (e.g., entire body, arms, legs)      All over 3. SEVERITY: "How bad is the pain?" (Scale 1-10; or mild, moderate, severe)   - MILD (1-3): doesn't interfere with normal activities    - MODERATE (4-7): interferes with normal activities or awakens from sleep    - SEVERE (8-10):  excruciating pain, unable to do any normal activities      Depends on the day  Protocols used: Muscle Aches and Body Pain-A-AH

## 2023-07-30 ENCOUNTER — Ambulatory Visit (INDEPENDENT_AMBULATORY_CARE_PROVIDER_SITE_OTHER): Payer: BC Managed Care – PPO | Admitting: Family Medicine

## 2023-07-30 VITALS — BP 145/84 | HR 75 | Ht 64.0 in | Wt 149.0 lb

## 2023-07-30 DIAGNOSIS — L4050 Arthropathic psoriasis, unspecified: Secondary | ICD-10-CM

## 2023-07-30 DIAGNOSIS — I480 Paroxysmal atrial fibrillation: Secondary | ICD-10-CM | POA: Diagnosis not present

## 2023-07-30 DIAGNOSIS — J340 Abscess, furuncle and carbuncle of nose: Secondary | ICD-10-CM

## 2023-07-30 DIAGNOSIS — R0602 Shortness of breath: Secondary | ICD-10-CM

## 2023-07-30 DIAGNOSIS — M5416 Radiculopathy, lumbar region: Secondary | ICD-10-CM

## 2023-07-30 DIAGNOSIS — R5383 Other fatigue: Secondary | ICD-10-CM | POA: Diagnosis not present

## 2023-07-30 DIAGNOSIS — N200 Calculus of kidney: Secondary | ICD-10-CM | POA: Diagnosis not present

## 2023-07-30 DIAGNOSIS — E039 Hypothyroidism, unspecified: Secondary | ICD-10-CM

## 2023-07-30 DIAGNOSIS — E611 Iron deficiency: Secondary | ICD-10-CM

## 2023-07-30 MED ORDER — MUPIROCIN 2 % EX OINT: 1.0000 | TOPICAL_OINTMENT | Freq: Two times a day (BID) | CUTANEOUS | 0 refills | Status: DC

## 2023-07-30 MED ORDER — CYCLOBENZAPRINE HCL 10 MG PO TABS: 10.0000 mg | ORAL_TABLET | Freq: Three times a day (TID) | ORAL | 0 refills | Status: DC | PRN

## 2023-07-30 MED ORDER — AIMOVIG 140 MG/ML ~~LOC~~ SOAJ: 140.0000 mg | SUBCUTANEOUS | 6 refills | Status: DC

## 2023-07-30 MED ORDER — TAMSULOSIN HCL 0.4 MG PO CAPS
0.4000 mg | ORAL_CAPSULE | Freq: Every day | ORAL | 0 refills | Status: DC
Start: 2023-07-30 — End: 2023-08-24

## 2023-07-30 NOTE — Progress Notes (Signed)
 Christine Hendrix - 58 y.o. female MRN [id number]  Date of birth: 12-27-1965  Subjective Chief Complaint  Patient presents with   Shortness of Breath    HPI Christine Hendrix is a 58 y.o. female here today for follow up visit.   She reports doing okay..  Sadly, she lost her mother about 1 month ago.  She does like she is handling this okay.  She has had continued issues with fatigue.  Extensive labs checked previously.  Ferritin was low on these.  Due to stress of caring for her mother during her prolonged illness she never started iron supplement.  She does have history of psoriatic arthritis and this is being managed  History of paroxysmal A-fib.  Metoprolol discontinued due to rash.  She remains on diltiazem.  This is being managed by cardiology.  Denies increased palpitations.  She does endorse some dyspnea at times.  Feels like this is independent of her anxiety.  She has also had issues with renal stones.  She brings in a stone today that she passed recently.  She has seen urology in the past but no stone analysis was done at that time.  Continues on Aimovig for migraine prevention.  Request renewal of this.  Reports nonhealing wound in the right nare with occasional nosebleeds.  ROS:  A comprehensive ROS was completed and negative except as noted per HPI  Allergies  Allergen Reactions   Codeine Nausea And Vomiting, Other (See Comments) and Rash    Vomiting and dizziness   Metoprolol Rash   Solifenacin Other (See Comments)    BLURRED VISION Other reaction(s): Hallucinations Visual disturbances   Molds & Smuts Other (See Comments)    Past Medical History:  Diagnosis Date   Arthritis    neck   Atrial fibrillation (HCC)    Colitis    chronic    Colitis    Dental crowns present    also dental implant lower right   Family history of colon cancer    Hyperlipidemia    Hypothyroidism    Kidney stone on left side 03/27/2017   Migraines    MVP (mitral valve prolapse)    Nasal  turbinate hypertrophy 04/2016   Nephrolithiasis    PONV (postoperative nausea and vomiting)    Psoriasis    TMJ syndrome    limited jaw opening    Past Surgical History:  Procedure Laterality Date   92 HOUR PH STUDY N/A 10/30/2021   Procedure: 24 HOUR PH STUDY;  Surgeon: Shellia Cleverly, DO;  Location: [facility];  Service: Gastroenterology;  Laterality: N/A;   ABDOMINAL HYSTERECTOMY     complete   BUNIONECTOMY WITH HAMMERTOE RECONSTRUCTION Left 03/04/2016   4th/5th toes   CHOLECYSTECTOMY     COLONOSCOPY     More than 5 years. [facility]. [place]    ESOPHAGEAL MANOMETRY N/A 10/30/2021   Procedure: ESOPHAGEAL MANOMETRY (EM);  Surgeon: Shellia Cleverly, DO;  Location: [facility];  Service: Gastroenterology;  Laterality: N/A;  ph impendence   ESOPHAGOGASTRODUODENOSCOPY     about 5 years ago. 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    Comment: Child psychotherapist  Tobacco Use   Smoking status: Never   Smokeless tobacco: Never  Vaping Use   Vaping status: Never Used  Substance and Sexual Activity   Alcohol use: No    Alcohol/week: 0.0 standard drinks of alcohol   Drug use: No   Sexual activity: Not on file  Other Topics Concern   Not on file  Social History Narrative   Not on file   Social Drivers of Health   Financial Resource Strain: Low Risk  (10/28/2022)   Received from [facility], [facility]   Overall Financial Resource Strain (CARDIA)    Difficulty  of Paying Living Expenses: Not hard at all  Food Insecurity: No Food Insecurity (10/28/2022)   Received from [facility], [facility]   Hunger Vital Sign    Worried About Running Out of Food in the Last Year: Never true    Ran Out of Food in the Last Year: Never true  Transportation Needs: No Transportation Needs (10/28/2022)   Received from [employer], [facility]   PRAPARE - Transportation    Lack of Transportation (Medical): No    Lack of Transportation (Non-Medical): No  Physical Activity: Insufficiently Active (02/12/2022)   Received from [facility], [facility]   Exercise Vital Sign    Days of Exercise per Week: 1 day    Minutes of Exercise per Session: 30 min  Stress: Stress Concern Present (02/12/2022)   Received from [facility], [facility] - Occupational Stress Questionnaire    Feeling of Stress : To some extent  Social Connections: Unknown (02/18/2023)   Received from [facility]   Social Network    Social Network: Not on file    Family History  Problem Relation Age of Onset   Heart disease Mother    CAD Mother    Heart disease Father    Heart failure Father    Colon cancer Father        dx early 35's    Liver disease Brother    Stomach cancer Brother    Colon cancer Brother    Liver cancer Brother    Esophageal cancer Neg Hx    Inflammatory bowel disease Neg Hx    Pancreatic cancer Neg Hx    Rectal cancer Neg Hx     Health Maintenance  Topic Date Due   Pneumococcal Vaccine 36-56 Years old (1 of 2 - PCV) Never done   DTaP/Tdap/Td (3 - Td or Tdap) 05/26/2020   MAMMOGRAM  08/02/2022   COVID-19 Vaccine (5 - 2024-25 season) 01/25/2023   INFLUENZA VACCINE  08/24/2023 (Originally 12/25/2022)   Hepatitis C Screening  12/25/2023 (Originally 03/28/1983)   HIV Screening  12/25/2023 (Originally 03/27/1980)   Colonoscopy  01/12/2025   Zoster Vaccines- Shingrix  Completed   HPV VACCINES  Aged Out      ----------------------------------------------------------------------------------------------------------------------------------------------------------------------------------------------------------------- Physical Exam BP (!) 145/84 (BP Location: Left Arm, Patient Position: Sitting, Cuff Size: Normal)   Pulse 75   Ht 5\' 4"  (1.626 m)   Wt 149 lb (67.6 kg)   SpO2 99%   BMI 25.58 kg/m   Physical Exam Constitutional:      Appearance: She is well-developed.  HENT:     Head: Normocephalic and atraumatic.  Eyes:  General: No scleral icterus. Cardiovascular:     Rate and Rhythm: Normal rate and regular rhythm.  Pulmonary:     Effort: Pulmonary effort is normal.     Breath sounds: Normal breath sounds.  Musculoskeletal:     Cervical back: Neck supple.  Neurological:     Mental Status: She is alert.  Psychiatric:        Mood and Affect: Mood normal.        Behavior: Behavior normal.     ------------------------------------------------------------------------------------------------------------------------------------------------------------------------------------------------------------------- Assessment and Plan  Nasal septum ulceration No red impaction previously.  Will represcribe this and have her try this.  If not healing will plan for ENT referral.  PAF (paroxysmal atrial fibrillation) (HCC) Management per cardiology.  Stable at this time with diltiazem.  EKG with normal sinus rhythm today.  Psoriatic arthritis (HCC) She was seeing rheumatology who is considering Skyrizi.  Altamease Oiler may also be an option for her.  Hypothyroidism Continue current strength of levothyroxine.  Update TSH  Shortness of breath EKG is unremarkable.  Spirometry ordered.  Other fatigue History of iron deficiency.  Rechecking iron panel.  Checking B12, vitamin D and TSH.  Renal stones Checking urinalysis and will send stone for analysis.   Meds ordered this encounter   Medications   mupirocin ointment (BACTROBAN) 2 %    Sig: Apply 1 Application topically 2 (two) times daily. Apply inside nares up to 10 days    Dispense:  22 g    Refill:  0   cyclobenzaprine (FLEXERIL) 10 MG tablet    Sig: Take 1 tablet (10 mg total) by mouth 3 (three) times daily as needed for muscle spasms.    Dispense:  30 tablet    Refill:  0   tamsulosin (FLOMAX) 0.4 MG CAPS capsule    Sig: Take 1 capsule (0.4 mg total) by mouth daily. At bedtime    Dispense:  30 capsule    Refill:  0   AIMOVIG 140 MG/ML SOAJ    Sig: Inject 140 mg into the skin every 30 (thirty) days.    Dispense:  1.12 mL    Refill:  6    No follow-ups on file.    This visit occurred during the SARS-CoV-2 public health emergency.  Safety protocols were in place, including screening questions prior to the visit, additional usage of staff PPE, and extensive cleaning of exam room while observing appropriate contact time as indicated for disinfecting solutions.

## 2023-07-31 ENCOUNTER — Encounter: Payer: Self-pay | Admitting: Family Medicine

## 2023-07-31 LAB — URINALYSIS, ROUTINE W REFLEX MICROSCOPIC
Bilirubin, UA: NEGATIVE
Glucose, UA: NEGATIVE
Nitrite, UA: NEGATIVE
Specific Gravity, UA: 1.026 (ref 1.005–1.030)
Urobilinogen, Ur: 0.2 mg/dL (ref 0.2–1.0)
pH, UA: 6 (ref 5.0–7.5)

## 2023-07-31 LAB — MICROSCOPIC EXAMINATION
Bacteria, UA: NONE SEEN
RBC, Urine: NONE SEEN /HPF (ref 0–2)

## 2023-08-02 ENCOUNTER — Encounter: Payer: Self-pay | Admitting: Family Medicine

## 2023-08-02 DIAGNOSIS — R0602 Shortness of breath: Secondary | ICD-10-CM | POA: Insufficient documentation

## 2023-08-02 DIAGNOSIS — R5383 Other fatigue: Secondary | ICD-10-CM | POA: Insufficient documentation

## 2023-08-02 DIAGNOSIS — N200 Calculus of kidney: Secondary | ICD-10-CM | POA: Insufficient documentation

## 2023-08-02 NOTE — Assessment & Plan Note (Signed)
 EKG is unremarkable.  Spirometry ordered.

## 2023-08-02 NOTE — Assessment & Plan Note (Signed)
 Management per cardiology.  Stable at this time with diltiazem.  EKG with normal sinus rhythm today.

## 2023-08-02 NOTE — Assessment & Plan Note (Signed)
 Checking urinalysis and will send stone for analysis.

## 2023-08-02 NOTE — Assessment & Plan Note (Signed)
 History of iron deficiency.  Rechecking iron panel.  Checking B12, vitamin D and TSH.

## 2023-08-02 NOTE — Assessment & Plan Note (Signed)
 She was seeing rheumatology who is considering Skyrizi.  Altamease Oiler may also be an option for her.

## 2023-08-02 NOTE — Assessment & Plan Note (Signed)
 No red impaction previously.  Will represcribe this and have her try this.  If not healing will plan for ENT referral.

## 2023-08-02 NOTE — Assessment & Plan Note (Signed)
 Continue current strength of levothyroxine.  Update TSH

## 2023-08-06 ENCOUNTER — Telehealth: Payer: Self-pay

## 2023-08-06 NOTE — Telephone Encounter (Signed)
 Neurology referral ordered by provider on 07/30/23.  Copied from CRM [id number]. Topic: Referral - Question >> Aug 06, 2023  9:16 AM Christine Hendrix Kitchen D wrote:  Patient wants a call back regarding her Neurology referral her doctor is suppose to submit and where does it go.

## 2023-08-07 LAB — CBC WITH DIFFERENTIAL/PLATELET
Basophils Absolute: 0 10*3/uL (ref 0.0–0.2)
Basos: 1 %
EOS (ABSOLUTE): 0.1 10*3/uL (ref 0.0–0.4)
Eos: 2 %
Hematocrit: 37.5 % (ref 34.0–46.6)
Hemoglobin: 12.5 g/dL (ref 11.1–15.9)
Immature Grans (Abs): 0 10*3/uL (ref 0.0–0.1)
Immature Granulocytes: 0 %
Lymphocytes Absolute: 2.5 10*3/uL (ref 0.7–3.1)
Lymphs: 35 %
MCH: 28 pg (ref 26.6–33.0)
MCHC: 33.3 g/dL (ref 31.5–35.7)
MCV: 84 fL (ref 79–97)
Monocytes Absolute: 0.7 10*3/uL (ref 0.1–0.9)
Monocytes: 10 %
Neutrophils Absolute: 3.8 10*3/uL (ref 1.4–7.0)
Neutrophils: 52 %
Platelets: 320 10*3/uL (ref 150–450)
RBC: 4.46 x10E6/uL (ref 3.77–5.28)
RDW: 13.4 % (ref 11.7–15.4)
WBC: 7.2 10*3/uL (ref 3.4–10.8)

## 2023-08-07 LAB — IRON,TIBC AND FERRITIN PANEL
Ferritin: 11 ng/mL — ABNORMAL LOW (ref 15–150)
Iron Saturation: 7 % — CL (ref 15–55)
Iron: 34 ug/dL (ref 27–159)
Total Iron Binding Capacity: 458 ug/dL — ABNORMAL HIGH (ref 250–450)
UIBC: 424 ug/dL (ref 131–425)

## 2023-08-07 LAB — CMP14+EGFR
ALT: 14 IU/L (ref 0–32)
AST: 18 IU/L (ref 0–40)
Albumin: 4.2 g/dL (ref 3.8–4.9)
Alkaline Phosphatase: 85 IU/L (ref 44–121)
BUN/Creatinine Ratio: 12 (ref 9–23)
BUN: 11 mg/dL (ref 6–24)
Bilirubin Total: <0.2 mg/dL (ref 0.0–1.2)
CO2: 22 mmol/L (ref 20–29)
Calcium: 9.1 mg/dL (ref 8.7–10.2)
Chloride: 102 mmol/L (ref 96–106)
Creatinine, Ser: 0.9 mg/dL (ref 0.57–1.00)
Globulin, Total: 2.5 g/dL (ref 1.5–4.5)
Glucose: 90 mg/dL (ref 70–99)
Potassium: 3.6 mmol/L (ref 3.5–5.2)
Sodium: 139 mmol/L (ref 134–144)
Total Protein: 6.7 g/dL (ref 6.0–8.5)
eGFR: 74 mL/min/{1.73_m2} (ref >59)

## 2023-08-07 LAB — CALCULI, WITH PHOTOGRAPH (CLINICAL LAB)
Calcium Oxalate Dihydrate: 60 %
Calcium Oxalate Monohydrate: 20 %
Hydroxyapatite: 20 %
Weight Calculi: 47 mg

## 2023-08-07 LAB — PARATHYROID HORMONE, INTACT (NO CA): PTH: 47 pg/mL (ref 15–65)

## 2023-08-07 LAB — VITAMIN B12: Vitamin B-12: 437 pg/mL (ref 232–1245)

## 2023-08-07 LAB — TSH: TSH: 2.1 u[IU]/mL (ref 0.450–4.500)

## 2023-08-07 LAB — VITAMIN D 25 HYDROXY (VIT D DEFICIENCY, FRACTURES): Vit D, 25-Hydroxy: 37.2 ng/mL (ref 30.0–100.0)

## 2023-08-14 DIAGNOSIS — H9201 Otalgia, right ear: Secondary | ICD-10-CM | POA: Diagnosis not present

## 2023-08-21 ENCOUNTER — Ambulatory Visit (INDEPENDENT_AMBULATORY_CARE_PROVIDER_SITE_OTHER): Admitting: Family Medicine

## 2023-08-21 VITALS — Ht 64.0 in | Wt 147.0 lb

## 2023-08-21 DIAGNOSIS — R0602 Shortness of breath: Secondary | ICD-10-CM | POA: Diagnosis not present

## 2023-08-21 MED ORDER — ALBUTEROL SULFATE (2.5 MG/3ML) 0.083% IN NEBU
2.5000 mg | INHALATION_SOLUTION | Freq: Once | RESPIRATORY_TRACT | Status: AC
Start: 2023-08-21 — End: 2023-08-21
  Administered 2023-08-21: 2.5 mg via RESPIRATORY_TRACT

## 2023-08-21 NOTE — Progress Notes (Addendum)
 Pt here today to perform spirometry test. Denies CP, headaches or med changes.                             Performed test on pt and put her on Dr. Ashley Royalty schedule.

## 2023-08-21 NOTE — Progress Notes (Signed)
 Normal spirometry without obstructive or restrictive pattern.

## 2023-08-22 ENCOUNTER — Other Ambulatory Visit: Payer: Self-pay | Admitting: Family Medicine

## 2023-08-24 NOTE — Addendum Note (Signed)
 Addended by: Chalmers Cater on: 08/24/2023 03:56 PM   Modules accepted: Orders

## 2023-08-29 ENCOUNTER — Other Ambulatory Visit: Payer: Self-pay | Admitting: Family Medicine

## 2023-08-31 ENCOUNTER — Telehealth: Payer: Self-pay | Admitting: Family Medicine

## 2023-08-31 NOTE — Telephone Encounter (Signed)
 Copied from CRM [id number]. Topic: Clinical - Medication Refill >> Aug 31, 2023  8:49 AM Philippa Chester F wrote: Most Recent Primary Care Visit:  Provider: Everrett Coombe  Department: [facility]  Visit Type: NURSE VISIT  Date: 08/21/2023  Patient stated that she is in need of the listed prescription in a 90 day quantity to be covered by insurance.   Medication: ondansetron (ZOFRAN-ODT) 8 MG disintegrating tablet   Has the patient contacted their pharmacy? Yes   Is this the correct pharmacy for this prescription? Yes  This is the patient's preferred pharmacy:    [facility] - [place], [place] - [address] [place] [address] Phone: [phone] Fax: [phone]   Has the prescription been filled recently? No  Is the patient out of the medication? Yes  Has the patient been seen for an appointment in the last year OR does the patient have an upcoming appointment? Yes  Can we respond through MyChart? Yes  Agent: Please be advised that Rx refills may take up to 3 business days. We ask that you follow-up with your pharmacy.

## 2023-08-31 NOTE — Telephone Encounter (Signed)
 Duplicate request

## 2023-08-31 NOTE — Telephone Encounter (Signed)
 Copied from CRM [id number]. Topic: Clinical - Medication Refill >> Aug 31, 2023  8:46 AM Philippa Chester F wrote: Most Recent Primary Care Visit:  Provider: Everrett Coombe  Department: [facility]  Visit Type: NURSE VISIT  Date: 08/21/2023  Patient is calling in stating that the listed prescription need to be refilled at her local pharmacy but needs to always be written as a 90 day quantity for her insurance to cover it.   Medication: AIMOVIG 140 MG/ML SOAJ  Has the patient contacted their pharmacy? Yes  Is this the correct pharmacy for this prescription? Yes If no, delete pharmacy and type the correct one.  This is the patient's preferred pharmacy:  [id number] [place], [place] - [address] [place] [address] Phone: [phone] Fax: [phone]   Has the prescription been filled recently? Yes  Is the patient out of the medication? No  Has the patient been seen for an appointment in the last year OR does the patient have an upcoming appointment? Yes  Can we respond through MyChart? Yes  Agent: Please be advised that Rx refills may take up to 3 business days. We ask that you follow-up with your pharmacy.

## 2023-09-02 MED ORDER — AIMOVIG 140 MG/ML ~~LOC~~ SOAJ: 140.0000 mg | SUBCUTANEOUS | 1 refills | Status: DC

## 2023-09-04 ENCOUNTER — Telehealth: Payer: Self-pay

## 2023-09-04 ENCOUNTER — Ambulatory Visit (INDEPENDENT_AMBULATORY_CARE_PROVIDER_SITE_OTHER): Payer: BC Managed Care – PPO | Admitting: Gastroenterology

## 2023-09-04 ENCOUNTER — Encounter: Payer: Self-pay | Admitting: Gastroenterology

## 2023-09-04 ENCOUNTER — Other Ambulatory Visit (HOSPITAL_COMMUNITY): Payer: Self-pay

## 2023-09-04 VITALS — BP 114/72 | HR 76 | Ht 64.0 in | Wt 146.5 lb

## 2023-09-04 DIAGNOSIS — K229 Disease of esophagus, unspecified: Secondary | ICD-10-CM

## 2023-09-04 DIAGNOSIS — R1114 Bilious vomiting: Secondary | ICD-10-CM

## 2023-09-04 DIAGNOSIS — R6881 Early satiety: Secondary | ICD-10-CM

## 2023-09-04 DIAGNOSIS — K219 Gastro-esophageal reflux disease without esophagitis: Secondary | ICD-10-CM

## 2023-09-04 DIAGNOSIS — K449 Diaphragmatic hernia without obstruction or gangrene: Secondary | ICD-10-CM

## 2023-09-04 DIAGNOSIS — K224 Dyskinesia of esophagus: Secondary | ICD-10-CM

## 2023-09-04 DIAGNOSIS — R1310 Dysphagia, unspecified: Secondary | ICD-10-CM

## 2023-09-04 MED ORDER — RABEPRAZOLE SODIUM 20 MG PO TBEC: 20.0000 mg | DELAYED_RELEASE_TABLET | Freq: Two times a day (BID) | ORAL | 2 refills | Status: DC

## 2023-09-04 NOTE — Telephone Encounter (Signed)
 PA request has been Submitted. New Encounter has been or will be created for follow up. For additional info see Pharmacy Prior Auth telephone encounter from 09/04/2023.

## 2023-09-04 NOTE — Telephone Encounter (Signed)
 Pharmacy Patient Advocate Encounter   Received notification from Patient Advice Request messages that prior authorization for Ondansetron 8MG  dispersible tablets is required/requested.   Insurance verification completed.   The patient is insured through [employer] .   Per test claim: PA required; PA submitted to above mentioned insurance via CoverMyMeds Key/confirmation #/EOC [id number] Status is pending

## 2023-09-04 NOTE — Telephone Encounter (Signed)
 Pharmacy Patient Advocate Encounter  Received notification from [employer] that Prior Authorization for Zofran ODT 8 has been APPROVED from 08/05/23 to 03/02/24. Ran test claim, Copay is $10.00 for 60 tablets which is a 20 day supply. This test claim was processed through [facility]- copay amounts may vary at other pharmacies due to pharmacy/plan contracts, or as the patient moves through the different stages of their insurance plan.   PA #/Case ID/Reference #: [id number]

## 2023-09-04 NOTE — Progress Notes (Signed)
 GASTROENTEROLOGY OUTPATIENT CLINIC VISIT   Primary Care Provider Adela Holter, DO [address] [facility] [address]  [address] [place] [address] [phone]  Patient Profile: Christine Hendrix is a 58 y.o. female with a pmh significant for atrial fibrillation (ASA/diltiazem), hyperlipidemia, hypothyroidism, nephrolithiasis, family history colon cancer (father/brother), GERD, hiatal hernia, esophageal hypersensitivity.   The patient presents to the [facility] for an evaluation and management of problem(s) noted below:  Problem List 1. Early satiety   2. Bilious vomiting with nausea   3. Gastroesophageal reflux disease without esophagitis   4. Esophageal hypersensitivity    Discussed the use of AI scribe software for clinical note transcription with the patient, who gave verbal consent to proceed.  History of Present Illness Please see prior GI notes for full details of HPI.  Interval History The patient returns for follow-up.  I have not seen her in over a year.  She has symptoms of esophageal reflux disease and has pH impedance testing suggesting hypersensitive esophagus as well.  She experiences persistent reflux symptoms, particularly exacerbated when lying down and worse at night. The sensation is unpleasant and occurs even during the day when lying down. Previously on a low dose of amitriptyline, which was discontinued by her migraine specialist doctor, as she is sensitive to medications. Currently, she takes pantoprazole twice daily and frequently uses Gaviscon for symptom relief. Without pantoprazole, her symptoms would be significantly worse.  She has not had an endoscopy since August 2021.  Dilation was helpful for some time, but symptoms of dysphagia are slowly recurring.  She believes that controlling her stomach issues better could potentially alleviate her migraines.  She has had two lower molars extracted in the past year due to infections, despite maintaining  good oral hygiene, and wonders if reflux could be contributing to dental issues.  She takes ondansetron frequently due to nausea.   She denies changes in bowel habits or blood in stools.   GI Review of Systems Positive as above including bloating at times Negative for dysphagia, odynophagia, alteration of bowel habits, melena, hematochezia  Review of Systems General: Denies fevers/chills/weight loss unintentionally Cardiovascular: Denies chest pain Pulmonary: Denies shortness of breath Gastroenterological: See HPI Genitourinary: Denies darkened urine  Hematological: Denies easy bruising/bleeding Dermatological: Denies jaundice Psychological: Mood is stable   Medications Current Outpatient Medications  Medication Sig Dispense Refill   AIMOVIG 140 MG/ML SOAJ Inject 140 mg into the skin every 30 (thirty) days. 3 mL 1   aspirin EC 81 MG tablet Take 1 tablet (81 mg total) by mouth daily. 90 tablet 3   estrogens, conjugated, (PREMARIN) 1.25 MG tablet Take 1 tablet (1.25 mg total) by mouth daily. 90 tablet 3   ibuprofen (ADVIL) 800 MG tablet Take 1 tablet (800 mg total) by mouth every 8 (eight) hours as needed. 90 tablet 3   meclizine (ANTIVERT) 25 MG tablet Take 1 tablet (25 mg total) by mouth 3 (three) times daily as needed for dizziness. 90 tablet 1   Multiple Vitamin (MULTIVITAMIN WITH MINERALS) TABS tablet Take 1 tablet by mouth daily.     RABEprazole (ACIPHEX) 20 MG tablet Take 1 tablet (20 mg total) by mouth 2 (two) times daily. 60 tablet 2   TYRVAYA 0.03 MG/ACT SOLN Place 1 spray into both nostrils 2 (two) times daily.     chlorzoxazone (PARAFON) 500 MG tablet Take 500 mg by mouth 4 (four) times daily as needed for muscle spasms. (Patient not taking: Reported on 12/25/2022)  Clobetasol Propionate (TEMOVATE) 0.05 % external spray Apply topically 2 (two) times daily. (Patient not taking: Reported on 12/25/2022) 125 mL 1   cyclobenzaprine (FLEXERIL) 10 MG tablet Take 1 tablet (10 mg total)  by mouth 3 (three) times daily as needed for muscle spasms. (Patient not taking: Reported on 09/04/2023) 30 tablet 0   diltiazem (CARDIZEM CD) 120 MG 24 hr capsule Take 1 capsule (120 mg total) by mouth daily. 90 capsule 3   estradiol (ESTRACE) 0.1 MG/GM vaginal cream  (Patient not taking: Reported on 09/04/2023)     gabapentin (NEURONTIN) 300 MG capsule Take 1 capsule (300 mg total) by mouth at bedtime. (Patient not taking: Reported on 09/04/2023) 90 capsule 3   ketoconazole (NIZORAL) 2 % cream Apply 1 Application topically daily. Use for two weeks (Patient not taking: Reported on 09/04/2023) 30 g 0   levothyroxine (SYNTHROID) 75 MCG tablet TAKE 1 TABLET DAILY BEFORE BREAKFAST 90 tablet 3   loteprednol (LOTEMAX) 0.5 % ophthalmic suspension 1 drop daily. (Patient not taking: Reported on 09/04/2023)     methotrexate (RHEUMATREX) 2.5 MG tablet 4 tablets Orally once a week for 84 days (Patient not taking: Reported on 09/04/2023)     mupirocin ointment (BACTROBAN) 2 % Apply 1 Application topically 2 (two) times daily. Apply inside nares up to 10 days (Patient not taking: Reported on 09/04/2023) 22 g 0   ondansetron (ZOFRAN-ODT) 8 MG disintegrating tablet Take 1 tablet (8 mg total) by mouth every 8 (eight) hours as needed for nausea or vomiting. 180 tablet 5   Rimegepant Sulfate (NURTEC) 75 MG TBDP Take 75 mg by mouth daily as needed (migraine). (Patient not taking: Reported on 09/04/2023) 36 tablet 1   sucralfate (CARAFATE) 1 GM/10ML suspension Take 10 mLs (1 g total) by mouth 2 (two) times daily. (Patient not taking: Reported on 09/04/2023) 420 mL 0   SUMAtriptan (IMITREX) 100 MG tablet TAKE 1 TABLET EVERY 2 HOURS AS NEEDED FOR MIGRAINE, MAY REPEAT IN 2 HOURS IF HEADACHE PERSISTS OR RECURS (Patient not taking: Reported on 09/04/2023) 9 tablet 27   tamsulosin (FLOMAX) 0.4 MG CAPS capsule TAKE 1 CAPSULE (0.4 MG TOTAL) BY MOUTH DAILY AT BEDTIME (Patient not taking: Reported on 09/04/2023) 90 capsule 1   zonisamide  (ZONEGRAN) 100 MG capsule Take 1 capsule (100 mg total) by mouth daily. (Patient not taking: Reported on 09/04/2023) 90 capsule 2   No current facility-administered medications for this visit.    Allergies Allergies  Allergen Reactions   Codeine Nausea And Vomiting, Other (See Comments) and Rash    Vomiting and dizziness   Metoprolol Rash   Solifenacin Other (See Comments)    BLURRED VISION Other reaction(s): Hallucinations Visual disturbances   Molds & Smuts Other (See Comments)    Histories Past Medical History:  Diagnosis Date   Arthritis    neck   Atrial fibrillation (HCC)    Colitis    chronic    Colitis    Dental crowns present    also dental implant lower right   Family history of colon cancer    Hyperlipidemia    Hypothyroidism    Kidney stone on left side 03/27/2017   Migraines    MVP (mitral valve prolapse)    Nasal turbinate hypertrophy 04/2016   Nephrolithiasis    PONV (postoperative nausea and vomiting)    Psoriasis    TMJ syndrome    limited jaw opening   Past Surgical History:  Procedure Laterality Date   57 HOUR PH  STUDY N/A 10/30/2021   Procedure: 24 HOUR PH STUDY;  Surgeon: Annis Kinder, DO;  Location: [facility];  Service: Gastroenterology;  Laterality: N/A;   ABDOMINAL HYSTERECTOMY     complete   BUNIONECTOMY WITH HAMMERTOE RECONSTRUCTION Left 03/04/2016   4th/5th toes   CHOLECYSTECTOMY     COLONOSCOPY     More than 5 years. [facility]. Donavon Fudge    ESOPHAGEAL MANOMETRY N/A 10/30/2021   Procedure: ESOPHAGEAL MANOMETRY (EM);  Surgeon: Annis Kinder, DO;  Location: [facility];  Service: Gastroenterology;  Laterality: N/A;  ph impendence   ESOPHAGOGASTRODUODENOSCOPY     about 5 years ago. [facility] [employer]   TMJ ARTHROPLASTY     TRIGEMINAL NERVE DECOMPRESSION     TURBINATE REDUCTION Bilateral 05/23/2016   Procedure: BILATERAL TURBINATE REDUCTION;  Surgeon: Reynold Caves, MD;  Location: [facility];   Service: ENT;  Laterality: Bilateral;   Social History   Socioeconomic History   Marital status: Married    Spouse name: Not on file   Number of children: Not on file   Years of education: Not on file   Highest education level: Not on file  Occupational History   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  Tobacco Use   Smoking status: Never   Smokeless tobacco: Never  Vaping Use   Vaping status: Never Used  Substance and Sexual Activity   Alcohol use: No    Alcohol/week: 0.0 standard drinks of alcohol   Drug use: No   Sexual activity: Not on file  Other Topics Concern   Not on file  Social History Narrative   Not on file   Social Drivers of Health   Financial Resource Strain: Low Risk  (10/28/2022)   Received from [facility], [facility]   Overall Financial Resource Strain (CARDIA)    Difficulty of Paying Living Expenses: Not hard at all  Food Insecurity: No Food Insecurity (10/28/2022)   Received from [facility], [facility]   Hunger Vital Sign    Worried About Running Out of Food in the Last Year: Never true    Ran Out of Food in the Last Year: Never true  Transportation Needs: No Transportation Needs (10/28/2022)   Received from [employer], [facility]   PRAPARE - Transportation    Lack of Transportation (Medical): No    Lack of Transportation (Non-Medical): No  Physical Activity: Insufficiently Active (02/12/2022)   Received from [facility], [facility]   Exercise Vital Sign    Days of Exercise per Week: 1 day    Minutes of Exercise per Session: 30 min  Stress: Stress Concern Present (02/12/2022)   Received from [facility], [facility] - Occupational Stress Questionnaire    Feeling of Stress : To some extent  Social Connections: Unknown (02/18/2023)   Received from [facility]   Social Network    Social Network: Not on file  Intimate Partner Violence: Unknown (02/18/2023)    Received from [facility]   HITS    Physically Hurt: Not on file    Insult or Talk Down To: Not on file    Threaten Physical Harm: Not on file    Scream or Curse: Not on file   Family History  Problem Relation Age of Onset   Heart disease Mother    CAD Mother    Heart disease Father    Heart failure Father  Colon cancer Father        dx early 91's    Liver disease Brother    Stomach cancer Brother    Colon cancer Brother    Liver cancer Brother    Esophageal cancer Neg Hx    Inflammatory bowel disease Neg Hx    Pancreatic cancer Neg Hx    Rectal cancer Neg Hx    I have reviewed her medical, social, and family history in detail and updated the electronic medical record as necessary.    PHYSICAL EXAMINATION  BP 114/72   Pulse 76   Ht 5\' 4"  (1.626 m)   Wt 146 lb 8 oz (66.5 kg)   BMI 25.15 kg/m  Wt Readings from Last 3 Encounters:  09/04/23 146 lb 8 oz (66.5 kg)  08/21/23 147 lb (66.7 kg)  07/30/23 149 lb (67.6 kg)  GEN: NAD, appears stated age, doesn't appear chronically ill PSYCH: Cooperative, without pressured speech EYE: Conjunctivae pink, sclerae anicteric ENT: MMM CV: Nontachycardic RESP: No audible wheezing GI: NABS, soft, NT/ND, without rebound or guarding MSK/EXT: No significant lower extremity edema SKIN: No jaundice NEURO:  Alert & Oriented x 3, no focal deficits   REVIEW OF DATA  I reviewed the following data at the time of this encounter:  GI Procedures and Studies  Today we reviewed once again June 2023 pH Impedence   June 2023 Manometry   2021 EGD - The examined esophagus was normal. The scope was withdrawn. Dilation was performed with a Maloney dilator with mild resistance at 54 Fr. The dilation site was examined following endoscope reinsertion and showed mild mucosal disruption at 18 cm, consistent with successful dilation of a subtle upper esophageal stricture. Estimated blood loss was minimal. Findings: - The Z-line was regular and  was found 40 cm from the incisors. - The gastroesophageal flap valve was visualized endoscopically and classified as Hill Grade III (minimal fold, loose to endoscope, hiatal hernia likely). - Patchy mild inflammation characterized by erythema was found in the gastric antrum.  Biopsies were taken with a cold forceps for Helicobacter pylori testing. Estimated blood loss was minimal. The remainder of the stomach was normal appearing. Additional biopsies obtained from the gastric body to evaluate for H pylori. - The duodenal bulb, first portion of the duodenum and second portion of the duodenum were normal. Biopsies for histology were taken with a cold forceps for evaluation of celiac disease. Estimated blood loss was minimal.  Laboratory Studies  Reviewed those in epic  Imaging Studies  No relevant studies to review   ASSESSMENT  Ms. Orwick is a 58 y.o. female with a pmh significant for atrial fibrillation (ASA/diltiazem), hyperlipidemia, hypothyroidism, nephrolithiasis, family history colon cancer (father/brother), GERD, hiatal hernia, esophageal hypersensitivity.  The patient is seen today for evaluation and management of:  1. Early satiety   2. Bilious vomiting with nausea   3. Gastroesophageal reflux disease without esophagitis   4. Esophageal hypersensitivity    The patient remains hemodynamically stable.  Clinically, patient is continuing to experience symptoms of reflux.  We had trialed TCA in the past, but this was stopped by her neurologist due to concern of initiating migraines.  She just remains on PPI therapy currently as well as Gaviscon therapy.  Will attempt to try to switch her PPI therapy.  We will consider the role of Voquenza for NERD/GERD, pending patient's findings at updated endoscopy.  If she has any evidence of esophagitis, she may benefit from consideration of fundoplication.  With  this being said, hypersensitive esophagus may not have full ability to have improvement with  fundoplication so that will need to be understood.  We want to make sure that her hiatal hernia has not increased in size either.  For her dysphagia symptoms can be available to attempt repeat dilation.  She will be due for colon cancer screening in 2026 due to her family history.  The risks and benefits of endoscopic evaluation were discussed with the patient; these include but are not limited to the risk of perforation, infection, bleeding, missed lesions, lack of diagnosis, severe illness requiring hospitalization, as well as anesthesia and sedation related illnesses.  The patient and/or family is agreeable to proceed.  I wonder if there could be any chance that she could have gastroparesis and so we will work to rule that out.  All patient questions were answered to the best of my ability, and the patient agrees to the aforementioned plan of action with follow-up as indicated.   PLAN  Switch to Aciphex 20 twice daily Will consider use of Voquenza in future Proceed with scheduling solid food gastric emptying study - Patient knows to hold Zofran for at least 2 days prior With evidence of previous hiatal hernia query the role of hiatal hernia repair and partial fundoplication if reflux symptoms persist in the setting of her pH study showing evidence of supine acid reflux (and when she is having her symptoms) Proceed with scheduling repeat upper endoscopy and potential dilation Colonoscopy for screening in 2026   Orders Placed This Encounter  Procedures   NM Gastric Emptying   Ambulatory referral to Gastroenterology    New Prescriptions   RABEPRAZOLE (ACIPHEX) 20 MG TABLET    Take 1 tablet (20 mg total) by mouth 2 (two) times daily.   Modified Medications   Modified Medication Previous Medication   LEVOTHYROXINE (SYNTHROID) 75 MCG TABLET levothyroxine (SYNTHROID) 75 MCG tablet      TAKE 1 TABLET DAILY BEFORE BREAKFAST    TAKE 1 TABLET DAILY BEFORE BREAKFAST   ONDANSETRON (ZOFRAN-ODT) 8 MG  DISINTEGRATING TABLET ondansetron (ZOFRAN-ODT) 8 MG disintegrating tablet      Take 1 tablet (8 mg total) by mouth every 8 (eight) hours as needed for nausea or vomiting.    PLACE 1 TABLET ON THE TONGUE EVERY 8 HOURS AS NEEDED FOR NAUSEA OR VOMITING    Planned Follow Up No follow-ups on file.   Total Time in Face-to-Face and in Coordination of Care for patient including independent/personal interpretation/review of prior testing, medical history, examination, medication adjustment, communicating results with the patient directly, and documentation within the EHR is 30 minutes.   Yong Henle, MD [facility]oscopy Office # [phone]

## 2023-09-04 NOTE — Patient Instructions (Signed)
 We have sent the following medications to your pharmacy for you to pick up at your convenience: Aciphex  Stop Pantoprazole.   Start Aciphex.   You will be contacted by [facility] in the next 2 days to arrange a Gastric Emptying Study .  The number on your caller ID will be [phone], please answer when they call.  If you have not heard from them in 2 days please call [phone] to schedule.     Due to recent changes in healthcare laws, you may see the results of your imaging and laboratory studies on MyChart before your provider has had a chance to review them.  We understand that in some cases there may be results that are confusing or concerning to you. Not all laboratory results come back in the same time frame and the provider may be waiting for multiple results in order to interpret others.  Please give [place] 48 hours in order for your provider to thoroughly review all the results before contacting the office for clarification of your results.   Thank you for choosing me and [facility].  Dr. Meridee Score

## 2023-09-04 NOTE — Telephone Encounter (Signed)
 Pt notified through MyChart.

## 2023-09-07 ENCOUNTER — Other Ambulatory Visit: Payer: Self-pay | Admitting: Family Medicine

## 2023-09-07 ENCOUNTER — Ambulatory Visit: Payer: Self-pay | Admitting: Family Medicine

## 2023-09-07 ENCOUNTER — Other Ambulatory Visit: Payer: Self-pay

## 2023-09-07 ENCOUNTER — Other Ambulatory Visit (HOSPITAL_COMMUNITY): Payer: Self-pay

## 2023-09-07 MED ORDER — ONDANSETRON 8 MG PO TBDP: 8.0000 mg | ORAL_TABLET | Freq: Three times a day (TID) | ORAL | 5 refills | Status: DC | PRN

## 2023-09-07 NOTE — Telephone Encounter (Signed)
 Copied from CRM [id number]. Topic: Clinical - Prescription Issue >> Sep 07, 2023 11:46 AM Corin V wrote: Reason for CRM: Patient stated that she spoke to [facility] this morning and they never received the Prescription for ondansetron (ZOFRAN-ODT) 8 MG disintegrating tablet and they told her it was sent to [facility]. Please resend to [facility].  She also asked if the PRIOR AUTHORIZATION could be adjusted to last for 1 year instead of just 6 months.

## 2023-09-07 NOTE — Telephone Encounter (Signed)
Refills sent in today. 

## 2023-09-08 ENCOUNTER — Encounter: Payer: Self-pay | Admitting: Gastroenterology

## 2023-09-08 DIAGNOSIS — R6881 Early satiety: Secondary | ICD-10-CM | POA: Insufficient documentation

## 2023-09-08 DIAGNOSIS — R1114 Bilious vomiting: Secondary | ICD-10-CM | POA: Insufficient documentation

## 2023-09-08 DIAGNOSIS — K229 Disease of esophagus, unspecified: Secondary | ICD-10-CM | POA: Insufficient documentation

## 2023-09-08 DIAGNOSIS — K219 Gastro-esophageal reflux disease without esophagitis: Secondary | ICD-10-CM | POA: Insufficient documentation

## 2023-09-25 ENCOUNTER — Other Ambulatory Visit (HOSPITAL_COMMUNITY)

## 2023-10-12 DIAGNOSIS — M542 Cervicalgia: Secondary | ICD-10-CM | POA: Diagnosis not present

## 2023-10-12 DIAGNOSIS — L4050 Arthropathic psoriasis, unspecified: Secondary | ICD-10-CM | POA: Diagnosis not present

## 2023-10-12 DIAGNOSIS — M79671 Pain in right foot: Secondary | ICD-10-CM | POA: Diagnosis not present

## 2023-10-12 DIAGNOSIS — M1990 Unspecified osteoarthritis, unspecified site: Secondary | ICD-10-CM | POA: Diagnosis not present

## 2023-10-13 DIAGNOSIS — G43719 Chronic migraine without aura, intractable, without status migrainosus: Secondary | ICD-10-CM | POA: Diagnosis not present

## 2023-10-22 ENCOUNTER — Other Ambulatory Visit: Payer: Self-pay | Admitting: Family Medicine

## 2023-10-22 DIAGNOSIS — Z1231 Encounter for screening mammogram for malignant neoplasm of breast: Secondary | ICD-10-CM | POA: Diagnosis not present

## 2023-10-22 LAB — HM MAMMOGRAPHY

## 2023-10-22 NOTE — Telephone Encounter (Signed)
 After listening to the recording, the patient stated she needs Premarin  1.25 mg a 7 day supply [facility] to [facility] [place] on [address] until she receives her supply from [facility]. She's says she's been out of this medication and that the approval is only for today and tomorrow, that the Rx will need to be sent in today or tomorrow.   Copied from CRM [id number]. Topic: Clinical - Prescription Issue >> Oct 22, 2023  4:34 PM Adrianna P wrote: Reason for CRM:  Patient [facility] a one time 7 [facility] [address], needs to be done by tomorrow 5/30 or they wont fill her prescription.

## 2023-10-23 ENCOUNTER — Telehealth: Payer: Self-pay

## 2023-10-23 ENCOUNTER — Encounter (HOSPITAL_COMMUNITY)
Admission: RE | Admit: 2023-10-23 | Discharge: 2023-10-23 | Disposition: A | Discharge status: Home / Self Care | Source: Ambulatory Visit | Attending: Gastroenterology | Admitting: Gastroenterology

## 2023-10-23 DIAGNOSIS — R6881 Early satiety: Secondary | ICD-10-CM | POA: Diagnosis not present

## 2023-10-23 DIAGNOSIS — K219 Gastro-esophageal reflux disease without esophagitis: Secondary | ICD-10-CM | POA: Insufficient documentation

## 2023-10-23 MED ORDER — TECHNETIUM TC 99M SULFUR COLLOID
2.1000 | Freq: Once | INTRAVENOUS | Status: AC
Start: 2023-10-23 — End: 2023-10-23
  Administered 2023-10-23: 2.1 via ORAL

## 2023-10-23 MED ORDER — ESTROGENS CONJUGATED 1.25 MG PO TABS: 1.2500 mg | ORAL_TABLET | Freq: Every day | ORAL | 0 refills | Status: DC

## 2023-10-23 NOTE — Telephone Encounter (Signed)
 Copied from CRM [id number]. Topic: Clinical - Prescription Issue >> Oct 22, 2023  4:34 PM Christine Hendrix wrote: Reason for CRM:  Patient needs a one time 7 day [facility] [address], needs to be done by tomorrow 5/30 or they wont fill her prescription.

## 2023-10-23 NOTE — Telephone Encounter (Signed)
 Medication already sent to the pharmacy

## 2023-10-26 ENCOUNTER — Telehealth: Payer: Self-pay | Admitting: Gastroenterology

## 2023-10-26 NOTE — Telephone Encounter (Signed)
 PT is requesting a prescription for aciphex  20mg  [facility] for a 90 day supply. She has found that it works better than pantoprazole  for her.

## 2023-10-27 ENCOUNTER — Encounter (HOSPITAL_BASED_OUTPATIENT_CLINIC_OR_DEPARTMENT_OTHER): Payer: Self-pay

## 2023-10-27 ENCOUNTER — Other Ambulatory Visit: Payer: Self-pay

## 2023-10-27 ENCOUNTER — Emergency Department (HOSPITAL_BASED_OUTPATIENT_CLINIC_OR_DEPARTMENT_OTHER)

## 2023-10-27 ENCOUNTER — Emergency Department (HOSPITAL_BASED_OUTPATIENT_CLINIC_OR_DEPARTMENT_OTHER)
Admission: EM | Admit: 2023-10-27 | Discharge: 2023-10-27 | Disposition: A | Discharge status: Home / Self Care | Attending: Emergency Medicine | Admitting: Emergency Medicine

## 2023-10-27 ENCOUNTER — Telehealth: Payer: Self-pay | Admitting: Gastroenterology

## 2023-10-27 DIAGNOSIS — Z7982 Long term (current) use of aspirin: Secondary | ICD-10-CM | POA: Diagnosis not present

## 2023-10-27 DIAGNOSIS — Z7901 Long term (current) use of anticoagulants: Secondary | ICD-10-CM | POA: Insufficient documentation

## 2023-10-27 DIAGNOSIS — G43909 Migraine, unspecified, not intractable, without status migrainosus: Secondary | ICD-10-CM | POA: Insufficient documentation

## 2023-10-27 DIAGNOSIS — E039 Hypothyroidism, unspecified: Secondary | ICD-10-CM | POA: Diagnosis not present

## 2023-10-27 DIAGNOSIS — E876 Hypokalemia: Secondary | ICD-10-CM | POA: Diagnosis not present

## 2023-10-27 DIAGNOSIS — R42 Dizziness and giddiness: Secondary | ICD-10-CM | POA: Insufficient documentation

## 2023-10-27 LAB — CBC WITH DIFFERENTIAL/PLATELET
Abs Immature Granulocytes: 0.02 10*3/uL (ref 0.00–0.07)
Basophils Absolute: 0 10*3/uL (ref 0.0–0.1)
Basophils Relative: 1 %
Eosinophils Absolute: 0.1 10*3/uL (ref 0.0–0.5)
Eosinophils Relative: 1 %
HCT: 41.8 % (ref 36.0–46.0)
Hemoglobin: 14.1 g/dL (ref 12.0–15.0)
Immature Granulocytes: 0 %
Lymphocytes Relative: 22 %
Lymphs Abs: 1.9 10*3/uL (ref 0.7–4.0)
MCH: 28.5 pg (ref 26.0–34.0)
MCHC: 33.7 g/dL (ref 30.0–36.0)
MCV: 84.4 fL (ref 80.0–100.0)
Monocytes Absolute: 0.6 10*3/uL (ref 0.1–1.0)
Monocytes Relative: 8 %
Neutro Abs: 5.8 10*3/uL (ref 1.7–7.7)
Neutrophils Relative %: 68 %
Platelets: 307 10*3/uL (ref 150–400)
RBC: 4.95 MIL/uL (ref 3.87–5.11)
RDW: 13.4 % (ref 11.5–15.5)
WBC: 8.5 10*3/uL (ref 4.0–10.5)
nRBC: 0 % (ref 0.0–0.2)

## 2023-10-27 LAB — BASIC METABOLIC PANEL WITH GFR
Anion gap: 15 (ref 5–15)
BUN: 11 mg/dL (ref 6–20)
CO2: 23 mmol/L (ref 22–32)
Calcium: 9.1 mg/dL (ref 8.9–10.3)
Chloride: 100 mmol/L (ref 98–111)
Creatinine, Ser: 0.74 mg/dL (ref 0.44–1.00)
GFR, Estimated: >60 mL/min (ref >60)
Glucose, Bld: 122 mg/dL — ABNORMAL HIGH (ref 70–99)
Potassium: 3.2 mmol/L — ABNORMAL LOW (ref 3.5–5.1)
Sodium: 138 mmol/L (ref 135–145)

## 2023-10-27 LAB — MAGNESIUM: Magnesium: 2 mg/dL (ref 1.7–2.4)

## 2023-10-27 MED ORDER — POTASSIUM CHLORIDE CRYS ER 20 MEQ PO TBCR
40.0000 meq | EXTENDED_RELEASE_TABLET | Freq: Once | ORAL | Status: AC
  Administered 2023-10-27: 40 meq via ORAL
  Filled 2023-10-27: qty 2

## 2023-10-27 MED ORDER — SODIUM CHLORIDE 0.9 % IV BOLUS
1000.0000 mL | Freq: Once | INTRAVENOUS | Status: AC
  Administered 2023-10-27: 1000 mL via INTRAVENOUS

## 2023-10-27 MED ORDER — METOCLOPRAMIDE HCL 5 MG/ML IJ SOLN
10.0000 mg | Freq: Once | INTRAMUSCULAR | Status: AC
Start: 2023-10-27 — End: 2023-10-27
  Administered 2023-10-27: 10 mg via INTRAVENOUS
  Filled 2023-10-27: qty 2

## 2023-10-27 MED ORDER — KETOROLAC TROMETHAMINE 15 MG/ML IJ SOLN
15.0000 mg | Freq: Once | INTRAMUSCULAR | Status: AC
Start: 2023-10-27 — End: 2023-10-27
  Administered 2023-10-27: 15 mg via INTRAVENOUS
  Filled 2023-10-27: qty 1

## 2023-10-27 MED ORDER — RABEPRAZOLE SODIUM 20 MG PO TBEC: 20.0000 mg | DELAYED_RELEASE_TABLET | Freq: Two times a day (BID) | ORAL | 2 refills | Status: AC

## 2023-10-27 MED ORDER — MECLIZINE HCL 25 MG PO TABS
25.0000 mg | ORAL_TABLET | Freq: Once | ORAL | Status: AC
  Administered 2023-10-27: 25 mg via ORAL
  Filled 2023-10-27: qty 1

## 2023-10-27 MED ORDER — DIPHENHYDRAMINE HCL 50 MG/ML IJ SOLN
25.0000 mg | Freq: Once | INTRAMUSCULAR | Status: AC
  Administered 2023-10-27: 25 mg via INTRAVENOUS
  Filled 2023-10-27: qty 1

## 2023-10-27 MED ORDER — MECLIZINE HCL 25 MG PO TABS: 25.0000 mg | ORAL_TABLET | Freq: Three times a day (TID) | ORAL | 0 refills | Status: AC | PRN

## 2023-10-27 NOTE — Telephone Encounter (Signed)
 It will not be a problem for her to receive that medication. Thanks. GM

## 2023-10-27 NOTE — Telephone Encounter (Signed)
 Patient called and stated that she was in the hospital and they are just now discharging her, but she is schedule for a procedure on June the 6 th. Patient stated that she was reading her instruction and she was not suppose to have any NSAIDS. Patient stated that at the hospital they had gave her toridol in her IV and was now wondering if it will be a problem. Patient is requesting a call back. Please advise.

## 2023-10-27 NOTE — ED Triage Notes (Signed)
 C/o vomiting, diarrhea, migraine since this morning. States "room is spinning". Felt she was in A.fib this morning (Shortness of breath, palpitations) but that has improved.

## 2023-10-27 NOTE — ED Provider Notes (Signed)
  EMERGENCY DEPARTMENT AT [facility] Provider Note   CSN: [id number] Arrival date & time: 10/27/23  1324     History  Chief Complaint  Patient presents with   Headache   Vomiting    Christine Hendrix is a 58 y.o. female.  58 year old female presents today for concern of headache with associated nausea, vomiting, diarrhea.  Occurred overnight.  States currently the headache has improved.  Denies any abdominal pain.  Endorses dizziness.  She states this morning she had palpitations.  She has history of A-fib and felt like she was in A-fib at the time but this has since resolved.  She takes a beta-blocker and aspirin .  Not on anticoagulation.  Has a specific regimen that works for her headaches which is Benadryl  50, Toradol  30, Reglan  10.  Husband is at bedside.  The history is provided by the patient. No language interpreter was used.       Home Medications Prior to Admission medications   Medication Sig Start Date End Date Taking? Authorizing Provider  AIMOVIG  140 MG/ML SOAJ Inject 140 mg into the skin every 30 (thirty) days. 09/02/23   Adela Holter, DO  aspirin  EC 81 MG tablet Take 1 tablet (81 mg total) by mouth daily. 03/24/18   Lenise Quince, MD  chlorzoxazone (PARAFON) 500 MG tablet Take 500 mg by mouth 4 (four) times daily as needed for muscle spasms. Patient not taking: Reported on 12/25/2022 02/13/22   [provider]  Clobetasol  Propionate (TEMOVATE ) 0.05 % external spray Apply topically 2 (two) times daily. Patient not taking: Reported on 12/25/2022 12/25/21   Adela Holter, DO  cyclobenzaprine  (FLEXERIL ) 10 MG tablet Take 1 tablet (10 mg total) by mouth 3 (three) times daily as needed for muscle spasms. Patient not taking: Reported on 09/04/2023 07/30/23   Adela Holter, DO  diltiazem  (CARDIZEM  CD) 120 MG 24 hr capsule Take 1 capsule (120 mg total) by mouth daily. 09/22/18 12/25/22  Lenise Quince, MD  estradiol (ESTRACE) 0.1 MG/GM vaginal cream      [provider]  estrogens , conjugated, (PREMARIN ) 1.25 MG tablet Take 1 tablet (1.25 mg total) by mouth daily. 10/23/23   Adela Holter, DO  gabapentin  (NEURONTIN ) 300 MG capsule Take 1 capsule (300 mg total) by mouth at bedtime. Patient not taking: Reported on 09/04/2023 12/28/22   Adela Holter, DO  ibuprofen  (ADVIL ) 800 MG tablet Take 1 tablet (800 mg total) by mouth every 8 (eight) hours as needed. 09/12/22   Adela Holter, DO  ketoconazole  (NIZORAL ) 2 % cream Apply 1 Application topically daily. Use for two weeks Patient not taking: Reported on 09/04/2023 09/19/22   Leon Rajas, MD  levothyroxine  (SYNTHROID ) 75 MCG tablet TAKE 1 TABLET DAILY BEFORE BREAKFAST 09/07/23   Adela Holter, DO  loteprednol (LOTEMAX) 0.5 % ophthalmic suspension 1 drop daily. Patient not taking: Reported on 09/04/2023 12/08/22   [provider]  meclizine  (ANTIVERT ) 25 MG tablet Take 1 tablet (25 mg total) by mouth 3 (three) times daily as needed for dizziness. 12/28/22   Adela Holter, DO  methotrexate (RHEUMATREX) 2.5 MG tablet 4 tablets Orally once a week for 84 days Patient not taking: Reported on 09/04/2023 12/10/22   [provider]  Multiple Vitamin (MULTIVITAMIN WITH MINERALS) TABS tablet Take 1 tablet by mouth daily.    [provider]  mupirocin  ointment (BACTROBAN ) 2 % Apply 1 Application topically 2 (two) times daily. Apply inside nares up to 10 days Patient not taking: Reported  on 09/04/2023 07/30/23   Adela Holter, DO  ondansetron  (ZOFRAN -ODT) 8 MG disintegrating tablet Take 1 tablet (8 mg total) by mouth every 8 (eight) hours as needed for nausea or vomiting. 09/07/23   Adela Holter, DO  RABEprazole  (ACIPHEX ) 20 MG tablet Take 1 tablet (20 mg total) by mouth 2 (two) times daily. 09/04/23   Mansouraty, Albino Alu., MD  Rimegepant Sulfate (NURTEC) 75 MG TBDP Take 75 mg by mouth daily as needed (migraine). Patient not taking: Reported on 09/04/2023 05/23/22   Cherre Cornish, NP   sucralfate  (CARAFATE ) 1 GM/10ML suspension Take 10 mLs (1 g total) by mouth 2 (two) times daily. Patient not taking: Reported on 09/04/2023 04/10/23   Mansouraty, Albino Alu., MD  SUMAtriptan  (IMITREX ) 100 MG tablet TAKE 1 TABLET EVERY 2 HOURS AS NEEDED FOR MIGRAINE, MAY REPEAT IN 2 HOURS IF HEADACHE PERSISTS OR RECURS Patient not taking: Reported on 09/04/2023 08/19/21   Adela Holter, DO  tamsulosin  (FLOMAX ) 0.4 MG CAPS capsule TAKE 1 CAPSULE (0.4 MG TOTAL) BY MOUTH DAILY AT BEDTIME Patient not taking: Reported on 09/04/2023 08/24/23   Adela Holter, DO  TYRVAYA 0.03 MG/ACT SOLN Place 1 spray into both nostrils 2 (two) times daily. 05/02/22   [provider]  zonisamide  (ZONEGRAN ) 100 MG capsule Take 1 capsule (100 mg total) by mouth daily. Patient not taking: Reported on 09/04/2023 08/13/22   Dala Dublin, MD      Allergies    Codeine, Metoprolol , Solifenacin, and Molds & smuts    Review of Systems   Review of Systems  Physical Exam Updated Vital Signs BP (!) 163/79 (BP Location: Right Arm)   Pulse 80   Temp (!) 97.5 F (36.4 C) (Tympanic)   Resp 20   Ht 5\' 4"  (1.626 m)   Wt 65.8 kg   SpO2 100%   BMI 24.89 kg/m  Physical Exam  ED Results / Procedures / Treatments   Labs (all labs ordered are listed, but only abnormal results are displayed) Labs Reviewed  BASIC METABOLIC PANEL WITH GFR - Abnormal; Notable for the following components:      Result Value   Potassium 3.2 (*)    Glucose, Bld 122 (*)    All other components within normal limits  CBC WITH DIFFERENTIAL/PLATELET  MAGNESIUM     EKG None  Radiology No results found.  Procedures Procedures    Medications Ordered in ED Medications  ketorolac  (TORADOL ) 15 MG/ML injection 15 mg (has no administration in time range)  diphenhydrAMINE  (BENADRYL ) injection 25 mg (has no administration in time range)  metoCLOPramide  (REGLAN ) injection 10 mg (has no administration in time range)  sodium chloride  0.9 %  bolus 1,000 mL (1,000 mLs Intravenous New Bag/Given 10/27/23 1005)    ED Course/ Medical Decision Making/ A&P Clinical Course as of 10/27/23 1545  Tue Oct 27, 2023  1350 CT Head Wo Contrast [AA]    Clinical Course User Index [AA] Lucina Sabal, PA-C                                 Medical Decision Making Amount and/or Complexity of Data Reviewed Labs: ordered. Radiology: ordered. Decision-making details documented in ED Course.  Risk Prescription drug management.   Medical Decision Making / ED Course   This patient presents to the ED for concern of headache, dizziness, this involves an extensive number of treatment options, and is a complaint that carries with it a high risk  of complications and morbidity.  The differential diagnosis includes migraine headache, vertigo, CVA, TIA  MDM: 58 year old female presents with above-mentioned complaints. Appears uncomfortable during interview. Will provide migraine cocktail, obtain labs, obtain CT head, and provide fluids and reevaluate.  CT head without acute intracranial process.  Labs with potassium of 3.2 otherwise without acute concern. Reports improvement after migraine cocktail.  Initial ambulation trial revealed patient required 2 person assist and was not able ambulate far. Discussed due to significant dizziness and unsteadiness we will transfer to [facility] for MRI for any concerning cause of her dizziness.  Patient was hesitant and requested to try ambulation again.  She did do better on the second trial.  I still offered transfer for MRI which she declined at this time.  She will closely follow-up with her PCP and return for any worsening symptoms.  Discharged in stable condition.  Return precaution discussed.  Patient voices understanding and is in agreement with plan.    Additional history obtained: -Additional history obtained from husband at bedside -External records from outside source obtained and reviewed including:  Chart review including previous notes, labs, imaging, consultation notes   Lab Tests: -I ordered, reviewed, and interpreted labs.   The pertinent results include:   Labs Reviewed  BASIC METABOLIC PANEL WITH GFR - Abnormal; Notable for the following components:      Result Value   Potassium 3.2 (*)    Glucose, Bld 122 (*)    All other components within normal limits  CBC WITH DIFFERENTIAL/PLATELET  MAGNESIUM       EKG  EKG Interpretation Date/Time:  Tuesday October 27 2023 09:16:13 EDT Ventricular Rate:  81 PR Interval:  159 QRS Duration:  95 QT Interval:  410 QTC Calculation: 476 R Axis:   33  Text Interpretation: Sinus rhythm Ventricular premature complex Minimal ST depression, inferior leads Confirmed by Russella Courts ([id number]) on 10/27/2023 10:55:09 AM         Imaging Studies ordered: I ordered imaging studies including ct head I independently visualized and interpreted imaging. I agree with the radiologist interpretation   Medicines ordered and prescription drug management: Meds ordered this encounter  Medications   ketorolac  (TORADOL ) 15 MG/ML injection 15 mg   diphenhydrAMINE  (BENADRYL ) injection 25 mg   metoCLOPramide  (REGLAN ) injection 10 mg   sodium chloride  0.9 % bolus 1,000 mL   potassium chloride  SA (KLOR-CON  M) CR tablet 40 mEq   meclizine  (ANTIVERT ) tablet 25 mg   meclizine  (ANTIVERT ) 25 MG tablet    Sig: Take 1 tablet (25 mg total) by mouth 3 (three) times daily as needed for dizziness.    Dispense:  30 tablet    Refill:  0    Supervising Provider:   Early Glisson [[id number]]    -I have reviewed the patients home medicines and have made adjustments as needed   Reevaluation: After the interventions noted above, I reevaluated the patient and found that they have :improved  Co morbidities that complicate the patient evaluation  Past Medical History:  Diagnosis Date   Arthritis    neck   Atrial fibrillation (HCC)    Colitis    chronic    Colitis     Dental crowns present    also dental implant lower right   Family history of colon cancer    Hyperlipidemia    Hypothyroidism    Kidney stone on left side 03/27/2017   Migraines    MVP (mitral valve prolapse)    Nasal turbinate hypertrophy 04/2016  Nephrolithiasis    PONV (postoperative nausea and vomiting)    Psoriasis    TMJ syndrome    limited jaw opening      Dispostion: Discharged in stable condition.  Return precaution discussed.  Patient voices understanding and is in agreement with the plan.   Final Clinical Impression(s) / ED Diagnoses Final diagnoses:  Migraine without status migrainosus, not intractable, unspecified migraine type  Dizziness    Rx / DC Orders ED Discharge Orders          Ordered    meclizine  (ANTIVERT ) 25 MG tablet  3 times daily PRN        10/27/23 1537              Lucina Sabal, PA-C 10/27/23 1548    Teddi Favors, DO 10/29/23 [phone]

## 2023-10-27 NOTE — Telephone Encounter (Signed)
Called and relayed message to patient.  Verbalized understanding.

## 2023-10-27 NOTE — Discharge Instructions (Addendum)
 Your workup today was reassuring.  No concerning cause of your dizziness.  You reported improvement after the medicines.  You are able to ambulate in the emergency department.  We did discuss transfer to [facility] for an MRI for further evaluation however you stated that you ambulated well and feel comfortable going home and would follow-up with your primary care doctor and would return if you have any worsening symptoms.

## 2023-10-27 NOTE — ED Notes (Signed)
Bedside commode 

## 2023-10-27 NOTE — ED Notes (Signed)
 Pt concerned about orders placed by provider. Stating she doesn't have migraine and receive inappropriate meds which sedate her. Pt complaining of being dizzy since yesterday, worse last night. Unsteady gait. Head hurting off and on. Advised pt I would speak with the attending EDP and request he speak with her. EDP advised

## 2023-10-27 NOTE — ED Notes (Signed)
 Pt ambulated around the nurse's station , pt stated she was more steady than the first time she ambulated still was having a bit of dizziness.

## 2023-10-27 NOTE — Telephone Encounter (Signed)
 Refill for Aciphex  has been sent to [facility].

## 2023-10-27 NOTE — ED Notes (Signed)
 Attempted to ambulate patient per PA request 2 person assist Patient took 3 steps Patient stated they are unsteady and dizzy (10)

## 2023-10-28 ENCOUNTER — Ambulatory Visit: Payer: Self-pay | Admitting: Gastroenterology

## 2023-10-30 ENCOUNTER — Other Ambulatory Visit: Payer: Self-pay

## 2023-10-30 ENCOUNTER — Ambulatory Visit: Admitting: Gastroenterology

## 2023-10-30 ENCOUNTER — Telehealth: Payer: Self-pay | Admitting: Gastroenterology

## 2023-10-30 ENCOUNTER — Encounter: Payer: Self-pay | Admitting: Gastroenterology

## 2023-10-30 DIAGNOSIS — K2950 Unspecified chronic gastritis without bleeding: Secondary | ICD-10-CM | POA: Diagnosis not present

## 2023-10-30 DIAGNOSIS — R6881 Early satiety: Secondary | ICD-10-CM | POA: Diagnosis not present

## 2023-10-30 DIAGNOSIS — R1011 Right upper quadrant pain: Secondary | ICD-10-CM

## 2023-10-30 DIAGNOSIS — R1114 Bilious vomiting: Secondary | ICD-10-CM

## 2023-10-30 DIAGNOSIS — K222 Esophageal obstruction: Secondary | ICD-10-CM | POA: Diagnosis not present

## 2023-10-30 DIAGNOSIS — K449 Diaphragmatic hernia without obstruction or gangrene: Secondary | ICD-10-CM | POA: Diagnosis not present

## 2023-10-30 DIAGNOSIS — K319 Disease of stomach and duodenum, unspecified: Secondary | ICD-10-CM | POA: Diagnosis not present

## 2023-10-30 DIAGNOSIS — K3189 Other diseases of stomach and duodenum: Secondary | ICD-10-CM

## 2023-10-30 MED ORDER — SODIUM CHLORIDE 0.9 % IV SOLN: 500.0000 mL | INTRAVENOUS | Status: DC

## 2023-10-30 NOTE — Progress Notes (Signed)
 Pt was seen in ER on 10-27-23 for migraines; she states "I was in afib for a while that night."  She takes Cardizem  daily for atrial frib and says she feels like she has been in SR since then.  Dr Brice Campi and [facility] Monday CRNA made aware.  Pt had "healing cap from implant" removed on 10-29-23.  Pt's gum was cut and still healing.  MD and CRNA made aware

## 2023-10-30 NOTE — Progress Notes (Signed)
 Report to PACU, RN, vss, BBS= Clear.

## 2023-10-30 NOTE — Progress Notes (Signed)
 GASTROENTEROLOGY PROCEDURE H&P NOTE   Primary Care Physician: Adela Holter, DO  HPI: Christine Hendrix is a 58 y.o. female who presents for EGD for evaluation of N//V/Pain and GERD symptoms.  Past Medical History:  Diagnosis Date   Arthritis    neck   Atrial fibrillation (HCC)    Colitis    chronic    Colitis    Dental crowns present    also dental implant lower right   Family history of colon cancer    Hyperlipidemia    Hypothyroidism    Kidney stone on left side 03/27/2017   Migraines    MVP (mitral valve prolapse)    Nasal turbinate hypertrophy 04/2016   Nephrolithiasis    PONV (postoperative nausea and vomiting)    Psoriasis    TMJ syndrome    limited jaw opening   Past Surgical History:  Procedure Laterality Date   63 HOUR PH STUDY N/A 10/30/2021   Procedure: 24 HOUR PH STUDY;  Surgeon: Annis Kinder, DO;  Location: [facility];  Service: Gastroenterology;  Laterality: N/A;   ABDOMINAL HYSTERECTOMY     complete   BUNIONECTOMY WITH HAMMERTOE RECONSTRUCTION Left 03/04/2016   4th/5th toes   CHOLECYSTECTOMY     COLONOSCOPY     More than 5 years. [facility]avon Fudge    ESOPHAGEAL MANOMETRY N/A 10/30/2021   Procedure: ESOPHAGEAL MANOMETRY (EM);  Surgeon: Annis Kinder, DO;  Location: [facility];  Service: Gastroenterology;  Laterality: N/A;  ph impendence   ESOPHAGOGASTRODUODENOSCOPY     about 5 years ago. [facility] [employer]   TMJ ARTHROPLASTY     TRIGEMINAL NERVE DECOMPRESSION     TURBINATE REDUCTION Bilateral 05/23/2016   Procedure: BILATERAL TURBINATE REDUCTION;  Surgeon: Reynold Caves, MD;  Location: [facility];  Service: ENT;  Laterality: Bilateral;   Current Outpatient Medications  Medication Sig Dispense Refill   albuterol  (VENTOLIN  HFA) 108 (90 Base) MCG/ACT inhaler SMARTSIG:2 Puff(s) By Mouth     chlorhexidine (PERIDEX) 0.12 % solution SMARTSIG:0.5 Capful(s) Every Night     clobetasol  ointment (TEMOVATE ) 0.05 %  Apply topically 2 (two) times daily.     doxycycline  (VIBRA -TABS) 100 MG tablet Take 100 mg by mouth 2 (two) times daily.     hyoscyamine (LEVSIN SL) 0.125 MG SL tablet Place under the tongue every 6 (six) hours as needed.     meloxicam (MOBIC) 15 MG tablet 1 tablet Orally Once a day for 30 days     naproxen sodium (ANAPROX) 550 MG tablet Take 550 mg by mouth every 8 (eight) hours.     SUMAtriptan  6 MG/0.5ML SOAJ SMARTSIG:1 auto-injector SUB-Q 1-2 Times Daily     AIMOVIG  140 MG/ML SOAJ Inject 140 mg into the skin every 30 (thirty) days. 3 mL 1   aspirin  EC 81 MG tablet Take 1 tablet (81 mg total) by mouth daily. 90 tablet 3   chlorzoxazone (PARAFON) 500 MG tablet Take 500 mg by mouth 4 (four) times daily as needed for muscle spasms. (Patient not taking: Reported on 12/25/2022)     Clobetasol  Propionate (TEMOVATE ) 0.05 % external spray Apply topically 2 (two) times daily. (Patient not taking: Reported on 12/25/2022) 125 mL 1   cyclobenzaprine  (FLEXERIL ) 10 MG tablet Take 1 tablet (10 mg total) by mouth 3 (three) times daily as needed for muscle spasms. (Patient not taking: Reported on 09/04/2023) 30 tablet 0   diltiazem  (CARDIZEM  CD) 120 MG 24 hr capsule Take 1 capsule (120  mg total) by mouth daily. 90 capsule 3   estradiol (ESTRACE) 0.1 MG/GM vaginal cream  (Patient not taking: Reported on 09/04/2023)     estrogens , conjugated, (PREMARIN ) 1.25 MG tablet Take 1 tablet (1.25 mg total) by mouth daily. 7 tablet 0   gabapentin  (NEURONTIN ) 300 MG capsule Take 1 capsule (300 mg total) by mouth at bedtime. (Patient not taking: Reported on 09/04/2023) 90 capsule 3   ibuprofen  (ADVIL ) 800 MG tablet Take 1 tablet (800 mg total) by mouth every 8 (eight) hours as needed. 90 tablet 3   ketoconazole  (NIZORAL ) 2 % cream Apply 1 Application topically daily. Use for two weeks (Patient not taking: Reported on 09/04/2023) 30 g 0   levothyroxine  (SYNTHROID ) 75 MCG tablet TAKE 1 TABLET DAILY BEFORE BREAKFAST 90 tablet 3    loteprednol (LOTEMAX) 0.5 % ophthalmic suspension 1 drop daily. (Patient not taking: Reported on 09/04/2023)     meclizine  (ANTIVERT ) 25 MG tablet Take 1 tablet (25 mg total) by mouth 3 (three) times daily as needed for dizziness. 30 tablet 0   methotrexate (RHEUMATREX) 2.5 MG tablet 4 tablets Orally once a week for 84 days (Patient not taking: Reported on 09/04/2023)     Multiple Vitamin (MULTIVITAMIN WITH MINERALS) TABS tablet Take 1 tablet by mouth daily.     mupirocin  ointment (BACTROBAN ) 2 % Apply 1 Application topically 2 (two) times daily. Apply inside nares up to 10 days (Patient not taking: Reported on 09/04/2023) 22 g 0   ondansetron  (ZOFRAN -ODT) 8 MG disintegrating tablet Take 1 tablet (8 mg total) by mouth every 8 (eight) hours as needed for nausea or vomiting. 180 tablet 5   RABEprazole  (ACIPHEX ) 20 MG tablet Take 1 tablet (20 mg total) by mouth 2 (two) times daily. 180 tablet 2   Rimegepant Sulfate (NURTEC) 75 MG TBDP Take 75 mg by mouth daily as needed (migraine). (Patient not taking: Reported on 09/04/2023) 36 tablet 1   sucralfate  (CARAFATE ) 1 GM/10ML suspension Take 10 mLs (1 g total) by mouth 2 (two) times daily. (Patient not taking: Reported on 09/04/2023) 420 mL 0   SUMAtriptan  (IMITREX ) 100 MG tablet TAKE 1 TABLET EVERY 2 HOURS AS NEEDED FOR MIGRAINE, MAY REPEAT IN 2 HOURS IF HEADACHE PERSISTS OR RECURS (Patient not taking: Reported on 09/04/2023) 9 tablet 27   tamsulosin  (FLOMAX ) 0.4 MG CAPS capsule TAKE 1 CAPSULE (0.4 MG TOTAL) BY MOUTH DAILY AT BEDTIME (Patient not taking: Reported on 09/04/2023) 90 capsule 1   TYRVAYA 0.03 MG/ACT SOLN Place 1 spray into both nostrils 2 (two) times daily.     zonisamide  (ZONEGRAN ) 100 MG capsule Take 1 capsule (100 mg total) by mouth daily. (Patient not taking: Reported on 09/04/2023) 90 capsule 2   No current facility-administered medications for this visit.    Current Outpatient Medications:    albuterol  (VENTOLIN  HFA) 108 (90 Base) MCG/ACT  inhaler, SMARTSIG:2 Puff(s) By Mouth, Disp: , Rfl:    chlorhexidine (PERIDEX) 0.12 % solution, SMARTSIG:0.5 Capful(s) Every Night, Disp: , Rfl:    clobetasol  ointment (TEMOVATE ) 0.05 %, Apply topically 2 (two) times daily., Disp: , Rfl:    doxycycline  (VIBRA -TABS) 100 MG tablet, Take 100 mg by mouth 2 (two) times daily., Disp: , Rfl:    hyoscyamine (LEVSIN SL) 0.125 MG SL tablet, Place under the tongue every 6 (six) hours as needed., Disp: , Rfl:    meloxicam (MOBIC) 15 MG tablet, 1 tablet Orally Once a day for 30 days, Disp: , Rfl:    naproxen sodium (  ANAPROX) 550 MG tablet, Take 550 mg by mouth every 8 (eight) hours., Disp: , Rfl:    SUMAtriptan  6 MG/0.5ML SOAJ, SMARTSIG:1 auto-injector SUB-Q 1-2 Times Daily, Disp: , Rfl:    AIMOVIG  140 MG/ML SOAJ, Inject 140 mg into the skin every 30 (thirty) days., Disp: 3 mL, Rfl: 1   aspirin  EC 81 MG tablet, Take 1 tablet (81 mg total) by mouth daily., Disp: 90 tablet, Rfl: 3   chlorzoxazone (PARAFON) 500 MG tablet, Take 500 mg by mouth 4 (four) times daily as needed for muscle spasms. (Patient not taking: Reported on 12/25/2022), Disp: , Rfl:    Clobetasol  Propionate (TEMOVATE ) 0.05 % external spray, Apply topically 2 (two) times daily. (Patient not taking: Reported on 12/25/2022), Disp: 125 mL, Rfl: 1   cyclobenzaprine  (FLEXERIL ) 10 MG tablet, Take 1 tablet (10 mg total) by mouth 3 (three) times daily as needed for muscle spasms. (Patient not taking: Reported on 09/04/2023), Disp: 30 tablet, Rfl: 0   diltiazem  (CARDIZEM  CD) 120 MG 24 hr capsule, Take 1 capsule (120 mg total) by mouth daily., Disp: 90 capsule, Rfl: 3   estradiol (ESTRACE) 0.1 MG/GM vaginal cream, , Disp: , Rfl:    estrogens , conjugated, (PREMARIN ) 1.25 MG tablet, Take 1 tablet (1.25 mg total) by mouth daily., Disp: 7 tablet, Rfl: 0   gabapentin  (NEURONTIN ) 300 MG capsule, Take 1 capsule (300 mg total) by mouth at bedtime. (Patient not taking: Reported on 09/04/2023), Disp: 90 capsule, Rfl: 3    ibuprofen  (ADVIL ) 800 MG tablet, Take 1 tablet (800 mg total) by mouth every 8 (eight) hours as needed., Disp: 90 tablet, Rfl: 3   ketoconazole  (NIZORAL ) 2 % cream, Apply 1 Application topically daily. Use for two weeks (Patient not taking: Reported on 09/04/2023), Disp: 30 g, Rfl: 0   levothyroxine  (SYNTHROID ) 75 MCG tablet, TAKE 1 TABLET DAILY BEFORE BREAKFAST, Disp: 90 tablet, Rfl: 3   loteprednol (LOTEMAX) 0.5 % ophthalmic suspension, 1 drop daily. (Patient not taking: Reported on 09/04/2023), Disp: , Rfl:    meclizine  (ANTIVERT ) 25 MG tablet, Take 1 tablet (25 mg total) by mouth 3 (three) times daily as needed for dizziness., Disp: 30 tablet, Rfl: 0   methotrexate (RHEUMATREX) 2.5 MG tablet, 4 tablets Orally once a week for 84 days (Patient not taking: Reported on 09/04/2023), Disp: , Rfl:    Multiple Vitamin (MULTIVITAMIN WITH MINERALS) TABS tablet, Take 1 tablet by mouth daily., Disp: , Rfl:    mupirocin  ointment (BACTROBAN ) 2 %, Apply 1 Application topically 2 (two) times daily. Apply inside nares up to 10 days (Patient not taking: Reported on 09/04/2023), Disp: 22 g, Rfl: 0   ondansetron  (ZOFRAN -ODT) 8 MG disintegrating tablet, Take 1 tablet (8 mg total) by mouth every 8 (eight) hours as needed for nausea or vomiting., Disp: 180 tablet, Rfl: 5   RABEprazole  (ACIPHEX ) 20 MG tablet, Take 1 tablet (20 mg total) by mouth 2 (two) times daily., Disp: 180 tablet, Rfl: 2   Rimegepant Sulfate (NURTEC) 75 MG TBDP, Take 75 mg by mouth daily as needed (migraine). (Patient not taking: Reported on 09/04/2023), Disp: 36 tablet, Rfl: 1   sucralfate  (CARAFATE ) 1 GM/10ML suspension, Take 10 mLs (1 g total) by mouth 2 (two) times daily. (Patient not taking: Reported on 09/04/2023), Disp: 420 mL, Rfl: 0   SUMAtriptan  (IMITREX ) 100 MG tablet, TAKE 1 TABLET EVERY 2 HOURS AS NEEDED FOR MIGRAINE, MAY REPEAT IN 2 HOURS IF HEADACHE PERSISTS OR RECURS (Patient not taking: Reported on 09/04/2023), Disp:  9 tablet, Rfl: 27    tamsulosin  (FLOMAX ) 0.4 MG CAPS capsule, TAKE 1 CAPSULE (0.4 MG TOTAL) BY MOUTH DAILY AT BEDTIME (Patient not taking: Reported on 09/04/2023), Disp: 90 capsule, Rfl: 1   TYRVAYA 0.03 MG/ACT SOLN, Place 1 spray into both nostrils 2 (two) times daily., Disp: , Rfl:    zonisamide  (ZONEGRAN ) 100 MG capsule, Take 1 capsule (100 mg total) by mouth daily. (Patient not taking: Reported on 09/04/2023), Disp: 90 capsule, Rfl: 2 Allergies  Allergen Reactions   Codeine Nausea And Vomiting, Other (See Comments) and Rash    Vomiting and dizziness   Metoprolol  Rash   Solifenacin Other (See Comments)    BLURRED VISION Other reaction(s): Hallucinations Visual disturbances   Molds & Smuts Other (See Comments)   Family History  Problem Relation Age of Onset   Heart disease Mother    CAD Mother    Heart disease Father    Heart failure Father    Colon cancer Father        dx early 44's    Liver disease Brother    Stomach cancer Brother    Colon cancer Brother    Liver cancer Brother    Esophageal cancer Neg Hx    Inflammatory bowel disease Neg Hx    Pancreatic cancer Neg Hx    Rectal cancer Neg Hx    Social History   Socioeconomic History   Marital status: Married    Spouse name: Not on file   Number of children: Not on file   Years of education: Not on file   Highest education level: Not on file  Occupational History   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    Comment: Child psychotherapist  Tobacco Use   Smoking status: Never   Smokeless tobacco: Never  Vaping Use   Vaping status: Never Used  Substance and Sexual Activity   Alcohol use: No    Alcohol/week: 0.0 standard drinks of alcohol   Drug use: No   Sexual activity: Not on file  Other Topics Concern   Not on file  Social History Narrative   Not on file   Social Drivers of Health   Financial Resource Strain: Low Risk  (10/28/2022)   Received from [facility], [facility]   Overall Financial Resource Strain (CARDIA)    Difficulty  of Paying Living Expenses: Not hard at all  Food Insecurity: No Food Insecurity (10/28/2022)   Received from [facility], [facility]   Hunger Vital Sign    Worried About Running Out of Food in the Last Year: Never true    Ran Out of Food in the Last Year: Never true  Transportation Needs: No Transportation Needs (10/28/2022)   Received from [employer], [facility]   PRAPARE - Transportation    Lack of Transportation (Medical): No    Lack of Transportation (Non-Medical): No  Physical Activity: Insufficiently Active (02/12/2022)   Received from [facility], [facility]   Exercise Vital Sign    Days of Exercise per Week: 1 day    Minutes of Exercise per Session: 30 min  Stress: Stress Concern Present (02/12/2022)   Received from [facility], [facility] - Occupational Stress Questionnaire    Feeling of Stress : To some extent  Social Connections: Unknown (02/18/2023)   Received from [facility]   Social Network    Social Network: Not on file  Intimate Partner Violence: Unknown (02/18/2023)   Received from [facility]   HITS    Physically Hurt: Not on file    Insult or Talk Down To: Not on file    Threaten Physical Harm: Not on file    Scream or Curse: Not on file    Physical Exam: There were no vitals filed for this visit. There is no height or weight on file to calculate BMI. GEN: NAD EYE: Sclerae anicteric ENT: MMM CV: Non-tachycardic GI: Soft, NT/ND NEURO:  Alert & Oriented x 3  Lab Results: No results for input(s): "WBC", "HGB", "HCT", "PLT" in the last 72 hours. BMET No results for input(s): "NA", "K", "CL", "CO2", "GLUCOSE", "BUN", "CREATININE", "CALCIUM" in the last 72 hours. LFT No results for input(s): "PROT", "ALBUMIN", "AST", "ALT", "ALKPHOS", "BILITOT", "BILIDIR", "IBILI" in the last 72 hours. PT/INR No results for input(s): "LABPROT", "INR" in the last 72 hours.   Impression / Plan: This is a 58  y.o.female who presents for EGD for evaluation of N//V/Pain and GERD symptoms.  The risks and benefits of endoscopic evaluation/treatment were discussed with the patient and/or family; these include but are not limited to the risk of perforation, infection, bleeding, missed lesions, lack of diagnosis, severe illness requiring hospitalization, as well as anesthesia and sedation related illnesses.  The patient's history has been reviewed, patient examined, no change in status, and deemed stable for procedure.  The patient and/or family is agreeable to proceed.    Yong Henle, MD [facility] Office # [phone]

## 2023-10-30 NOTE — Telephone Encounter (Signed)
 PT is scheduled for an EGD on today at 2pm and wants to know if her insurance was contacted and we have authorization to have this done. Please advise.

## 2023-10-30 NOTE — Op Note (Signed)
 [facility] Patient Name: Christine Hendrix Procedure Date: 10/30/2023 3:08 PM MRN: [id number] Endoscopist: Yong Henle , MD, [id number] Age: 58 Referring MD:  Date of Birth: April 09, 1966 Gender: Female Account #: [id number] Procedure:                Upper GI endoscopy Indications:              Epigastric abdominal pain, Abdominal pain in the                            right upper quadrant, Dysphagia, Nausea with                            vomiting Medicines:                Monitored Anesthesia Care Procedure:                Pre-Anesthesia Assessment:                           - Prior to the procedure, a History and Physical                            was performed, and patient medications and                            allergies were reviewed. The patient's tolerance of                            previous anesthesia was also reviewed. The risks                            and benefits of the procedure and the sedation                            options and risks were discussed with the patient.                            All questions were answered, and informed consent                            was obtained. Prior Anticoagulants: The patient has                            taken no anticoagulant or antiplatelet agents                            except for aspirin . ASA Grade Assessment: II - A                            patient with mild systemic disease. After reviewing                            the risks and benefits, the patient was deemed in  satisfactory condition to undergo the procedure.                           After obtaining informed consent, the endoscope was                            passed under direct vision. Throughout the                            procedure, the patient's blood pressure, pulse, and                            oxygen saturations were monitored continuously. The                            Olympus Scope [id number] was  introduced through the                            mouth, and advanced to the second part of duodenum.                            The upper GI endoscopy was accomplished without                            difficulty. The patient tolerated the procedure. Scope In: Scope Out: Findings:                 No gross mucosal lesions were noted in the entire                            esophagus.                           A non-obstructing Schatzki ring was found at the                            gastroesophageal junction. Disruption biopsies were                            taken with a cold forceps for histology                            circumferentially.                           The Z-line was irregular and was found 36 cm from                            the incisors.                           A 2 cm hiatal hernia was present.                           Localized moderate mucosal changes characterized by  granularity and altered texture were found in the                            prepyloric region of the stomach. Biopsies were                            taken with a cold forceps for histology.                           Striped mildly erythematous mucosa without bleeding                            was found in the gastric antrum.                           No other gross lesions were noted in the entire                            examined stomach. Biopsies were taken with a cold                            forceps for histology and Helicobacter pylori                            testing.                           No gross lesions were noted in the duodenal bulb,                            in the first portion of the duodenum and in the                            second portion of the duodenum. Biopsies were taken                            with a cold forceps for histology. Complications:            No immediate complications. Estimated Blood Loss:     Estimated blood  loss was minimal. Impression:               - No gross mucosal lesions in the entire esophagus.                           - Non-obstructing Schatzki ring. Disrupted.                           - Z-line irregular, 36 cm from the incisors.                           - 2 cm hiatal hernia.                           - Granular and texture changed mucosa in the  prepyloric region of the stomach. Biopsied.                           - Erythematous mucosa in the antrum.                           - No other gross lesions in the entire stomach.                            Biopsied.                           - No gross lesions in the duodenal bulb, in the                            first portion of the duodenum and in the second                            portion of the duodenum. Biopsied. Recommendation:           - The patient will be observed post-procedure,                            until all discharge criteria are met.                           - Discharge patient to home.                           - Patient has a contact number available for                            emergencies. The signs and symptoms of potential                            delayed complications were discussed with the                            patient. Return to normal activities tomorrow.                            Written discharge instructions were provided to the                            patient.                           - Resume previous diet.                           - Continue present medications.                           - Await pathology results.                           -  The findings and recommendations were discussed                            with the patient.                           - The findings and recommendations were discussed                            with the patient's family. Yong Henle, MD 10/30/2023 3:36:22 PM

## 2023-10-30 NOTE — Patient Instructions (Addendum)
 -Handout on hiatal hernia provided  YOU HAD AN ENDOSCOPIC PROCEDURE TODAY AT THE [facility]:   Refer to the procedure report that was given to you for any specific questions about what was found during the examination.  If the procedure report does not answer your questions, please call your gastroenterologist to clarify.  If you requested that your care partner not be given the details of your procedure findings, then the procedure report has been included in a sealed envelope for you to review at your convenience later.  YOU SHOULD EXPECT: Some feelings of bloating in the abdomen. Passage of more gas than usual.  Walking can help get rid of the air that was put into your GI tract during the procedure and reduce the bloating. If you had a lower endoscopy (such as a colonoscopy or flexible sigmoidoscopy) you may notice spotting of blood in your stool or on the toilet paper. If you underwent a bowel prep for your procedure, you may not have a normal bowel movement for a few days.  Please Note:  You might notice some irritation and congestion in your nose or some drainage.  This is from the oxygen used during your procedure.  There is no need for concern and it should clear up in a day or so.  SYMPTOMS TO REPORT IMMEDIATELY:  Following upper endoscopy (EGD)  Vomiting of blood or coffee ground material  New chest pain or pain under the shoulder blades  Painful or persistently difficult swallowing  New shortness of breath  Fever of 100F or higher  Black, tarry-looking stools  For urgent or emergent issues, a gastroenterologist can be reached at any hour by calling [phone]. Do not use MyChart messaging for urgent concerns.    DIET:  We do recommend a small meal at first, but then you may proceed to your regular diet.  Drink plenty of fluids but you should avoid alcoholic beverages for 24 hours.  ACTIVITY:  You should plan to take it easy for the rest of today and you should  NOT DRIVE or use heavy machinery until tomorrow (because of the sedation medicines used during the test).    FOLLOW UP: Our staff will call the number listed on your records the next business day following your procedure.  We will call around 7:15- 8:00 am to check on you and address any questions or concerns that you may have regarding the information given to you following your procedure. If we do not reach you, we will leave a message.     If any biopsies were taken you will be contacted by phone or by letter within the next 1-3 weeks.  Please call us  at [phone] if you have not heard about the biopsies in 3 weeks.    SIGNATURES/CONFIDENTIALITY: You and/or your care partner have signed paperwork which will be entered into your electronic medical record.  These signatures attest to the fact that that the information above on your After Visit Summary has been reviewed and is understood.  Full responsibility of the confidentiality of this discharge information lies with you and/or your care-partner.   Hiatal Hernia  A hiatal hernia occurs when part of the stomach slides above the muscle that separates the abdomen from the chest (diaphragm). A person can be born with a hiatal hernia (congenital), or it may develop over time. In almost all cases of hiatal hernia, only the top part of the stomach pushes through the diaphragm. Many people have a  hiatal hernia with no symptoms. The larger the hernia, the more likely it is that you will have symptoms. In some cases, a hiatal hernia allows stomach acid to flow back into the tube that carries food from your mouth to your stomach (esophagus). This may cause heartburn symptoms. The development of heartburn symptoms may mean that you have a condition called gastroesophageal reflux disease (GERD). What are the causes? This condition is caused by a weakness in the opening (hiatus) where the esophagus passes through the diaphragm to attach to the upper part  of the stomach. A person may be born with a weakness in the hiatus, or a weakness can develop over time. What increases the risk? This condition is more likely to develop in: Older people. Age is a major risk factor for a hiatal hernia, especially if you are over the age of 68. Pregnant women. People who are overweight. People who have frequent constipation. What are the signs or symptoms? Symptoms of this condition usually develop in the form of GERD symptoms. Symptoms include: Heartburn. Upset stomach (indigestion). Trouble swallowing. Coughing or wheezing. Wheezing is making high-pitched whistling sounds when you breathe. Sore throat. Chest pain. Nausea and vomiting. How is this diagnosed? This condition may be diagnosed during testing for GERD. Tests that may be done include: X-rays of your stomach or chest. An upper gastrointestinal (GI) series. This is an X-ray exam of your GI tract that is taken after you swallow a chalky liquid that shows up clearly on the X-ray. Endoscopy. This is a procedure to look into your stomach using a thin, flexible tube that has a tiny camera and light on the end of it. How is this treated? This condition may be treated by: Dietary and lifestyle changes to help reduce GERD symptoms. Medicines. These may include: Over-the-counter antacids. Medicines that make your stomach empty more quickly. Medicines that block the production of stomach acid (H2 blockers). Stronger medicines to reduce stomach acid (proton pump inhibitors). Surgery to repair the hernia, if other treatments are not helping. If you have no symptoms, you may not need treatment. Follow these instructions at home: Lifestyle and activity Do not use any products that contain nicotine or tobacco. These products include cigarettes, chewing tobacco, and vaping devices, such as e-cigarettes. If you need help quitting, ask your health care provider. Try to achieve and maintain a healthy body  weight. Avoid putting pressure on your abdomen. Anything that puts pressure on your abdomen increases the amount of acid that may be pushed up into your esophagus. Avoid bending over, especially after eating. Raise the head of your bed by putting blocks under the legs. This keeps your head and esophagus higher than your stomach. Do not wear tight clothing around your chest or stomach. Try not to strain when having a bowel movement, when urinating, or when lifting heavy objects. Eating and drinking Avoid foods that can worsen GERD symptoms. These may include: Fatty foods, like fried foods. Citrus fruits, like oranges or lemon. Other foods and drinks that contain acid, like orange juice or tomatoes. Spicy food. Chocolate. Eat frequent small meals instead of three large meals a day. This helps prevent your stomach from getting too full. Eat slowly. Do not lie down right after eating. Do not eat 1-2 hours before bed. Do not drink beverages with caffeine. These include cola, coffee, cocoa, and tea. Do not drink alcohol. General instructions Take over-the-counter and prescription medicines only as told by your health care provider. Keep all follow-up  visits. Your health care provider will want to check that any new prescribed medicines are helping your symptoms. Contact a health care provider if: Your symptoms are not controlled with medicines or lifestyle changes. You are having trouble swallowing. You have coughing or wheezing that will not go away. Your pain is getting worse. Your pain spreads to your arms, neck, jaw, teeth, or back. You feel nauseous or you vomit. Get help right away if: You have shortness of breath. You vomit blood. You have bright red blood in your stools. You have black, tarry stools. These symptoms may be an emergency. Get help right away. Call 911. Do not wait to see if the symptoms will go away. Do not drive yourself to the hospital. Summary A hiatal hernia  occurs when part of the stomach slides above the muscle that separates the abdomen from the chest. A person may be born with a weakness in the hiatus, or a weakness can develop over time. Symptoms of a hiatal hernia may include heartburn, trouble swallowing, or sore throat. Management of a hiatal hernia includes eating frequent small meals instead of three large meals a day. Get help right away if you vomit blood, have bright red blood in your stools, or have black, tarry stools. This information is not intended to replace advice given to you by your health care provider. Make sure you discuss any questions you have with your health care provider. Document Revised: 07/09/2021 Document Reviewed: 07/09/2021 Elsevier Patient Education  2024 [employer].

## 2023-11-02 ENCOUNTER — Telehealth: Payer: Self-pay

## 2023-11-02 NOTE — Telephone Encounter (Signed)
 Left message

## 2023-11-04 ENCOUNTER — Telehealth: Payer: Self-pay | Admitting: Gastroenterology

## 2023-11-04 DIAGNOSIS — M8589 Other specified disorders of bone density and structure, multiple sites: Secondary | ICD-10-CM | POA: Diagnosis not present

## 2023-11-04 LAB — SURGICAL PATHOLOGY

## 2023-11-04 NOTE — Telephone Encounter (Signed)
 The pt states she continues to have burning and pain under the ribs on the right side as well as nausea since the EGD on 10/30/23. These are the same symptoms she had prior to the EGD.  She is taking Aciphex  20 mg BID, Gaviscon daily as needed and Zofran  as needed.  Nothing seems to be helping.  Please advise

## 2023-11-04 NOTE — Telephone Encounter (Signed)
 Inbound call from patient states she had procedure 10/30/2023 and her stomach pains have worsened she states her medication is not helping and would like to speak to nurse please advise

## 2023-11-05 ENCOUNTER — Other Ambulatory Visit: Payer: Self-pay

## 2023-11-05 MED ORDER — VOQUEZNA 10 MG PO TABS: 10.0000 mg | ORAL_TABLET | Freq: Every day | ORAL | 6 refills | Status: DC

## 2023-11-05 NOTE — Telephone Encounter (Signed)
 The pt has been advised of the prescription.  She will call our office with an update in a week or so. She was also educated on anti reflux precautions. She will call back to make a follow up appt

## 2023-11-05 NOTE — Telephone Encounter (Signed)
 Patty, Patient states Aciphex  was better than her prior PPI. If she wants to make another medicine transition, we could consider Voquenza. Offer her transition her to Voquenza 10 mg daily for attempt at treatment of nonerosive reflux disease (30/6). Let me know what she decides. Please set her up for follow-up with APP or myself in the next 2 to 4 months. Thanks. GM

## 2023-11-06 DIAGNOSIS — E782 Mixed hyperlipidemia: Secondary | ICD-10-CM | POA: Diagnosis not present

## 2023-11-06 DIAGNOSIS — Z1331 Encounter for screening for depression: Secondary | ICD-10-CM | POA: Diagnosis not present

## 2023-11-06 DIAGNOSIS — R0600 Dyspnea, unspecified: Secondary | ICD-10-CM | POA: Diagnosis not present

## 2023-11-06 DIAGNOSIS — I4891 Unspecified atrial fibrillation: Secondary | ICD-10-CM | POA: Diagnosis not present

## 2023-11-06 DIAGNOSIS — I2510 Atherosclerotic heart disease of native coronary artery without angina pectoris: Secondary | ICD-10-CM | POA: Diagnosis not present

## 2023-11-07 ENCOUNTER — Ambulatory Visit: Payer: Self-pay | Admitting: Gastroenterology

## 2023-11-12 ENCOUNTER — Telehealth: Payer: Self-pay | Admitting: Gastroenterology

## 2023-11-12 NOTE — Telephone Encounter (Signed)
 The pt has been advised to come in if she has the RUQ pain for labs at our office.  Lab orders have been entered

## 2023-11-12 NOTE — Telephone Encounter (Signed)
 Patient is calling due to her receiving a message on my chart regarding lab orders. Patient is requesting to speak to the nurse. Please advise.

## 2023-11-30 ENCOUNTER — Telehealth: Payer: Self-pay | Admitting: Gastroenterology

## 2023-11-30 NOTE — Telephone Encounter (Signed)
 Patient called and stated that her insurance told her that she is needing a prior authorization for stomach emptying test she had done. Patient stated that her insurance will not pay for that procedure that was done due to them not having a prior authorization. Patient is requesting a call back. Please advise.

## 2023-12-04 NOTE — Telephone Encounter (Signed)
 Patient called and stated that she has still not received a call back from someone in [place]. Patient is requesting a call back per information below. Please advise.

## 2023-12-07 NOTE — Telephone Encounter (Signed)
 Patient is calling to see if the authorization has been done for her NCV.   Patient's [phone]

## 2023-12-08 ENCOUNTER — Telehealth: Payer: Self-pay

## 2023-12-08 NOTE — Telephone Encounter (Signed)
 Copied from CRM [id number]. Topic: Clinical - Medication Prior Auth >> Dec 07, 2023  1:25 PM Cherylann S wrote: Reason for CRM: Nevaeh from [employer] for DX and Procedure code for Conduction test for the patient's PA. Reached out to [facility] and tx call successfully. >> Dec 07, 2023  4:40 PM Miquel SAILOR wrote: Johnston from [facility]  Requesting Procedure or CPT code for visit for patient 07/15. Asked for call back number but she stated no number will call back tomorrow for issue. Called after hours let her know only option is message.   >> Dec 07, 2023  3:56 PM Susanna ORN wrote: Patient called to get diagnosis & procedure codes for the nerve conduction study that she has tomorrow. She states she needs this info to see if the insurance will cover it. Called [facility] & spoke with nurses, Jon ODESSIA Cramp, and both of them stated that Dr. Alvia did not order the test & that patient would have to call [facility] as they are the ones who ordered it. Patient states Dr. Alvia did order the test but he did not. He only did the referral. Warm transferred patient to [facility] with [facility].

## 2023-12-08 NOTE — Telephone Encounter (Signed)
 Spoke with [employer]  We do not have procedure code for the NCV study testing and informed her that they would need to contact neurology who is conducting the test for this information.  Phone Number for neurology given to [employer] to reach out to them for this testing information

## 2023-12-11 ENCOUNTER — Ambulatory Visit

## 2023-12-11 ENCOUNTER — Encounter: Payer: Self-pay | Admitting: Sports Medicine

## 2023-12-11 ENCOUNTER — Ambulatory Visit (INDEPENDENT_AMBULATORY_CARE_PROVIDER_SITE_OTHER): Admitting: Sports Medicine

## 2023-12-11 DIAGNOSIS — M25512 Pain in left shoulder: Secondary | ICD-10-CM | POA: Diagnosis not present

## 2023-12-11 DIAGNOSIS — M7542 Impingement syndrome of left shoulder: Secondary | ICD-10-CM | POA: Diagnosis not present

## 2023-12-11 MED ORDER — MELOXICAM 15 MG PO TABS: ORAL_TABLET | ORAL | 3 refills | Status: DC

## 2023-12-11 NOTE — Assessment & Plan Note (Signed)
 Very pleasant 58 year old female, chronic left shoulder pain that she localizes mostly over the deltoid, worse with abduction. She has had an MRI in the past that showed some cuff tendinosis. She has had physical therapy predominantly focusing on her scapula but not so much the rotator cuff. She is hoping to avoid pharmacologic treatment but is amenable to try it. On exam she has a positive Hawkins test, she was very hesitant to allow me to do much more physical examination maneuvers as historically the maneuvers we do as providers worsens her pain significantly. We ended the physical examination after noting a positive Hawkins sign. I explained to her the anatomy and force coupling function of the rotator cuff, she understands the importance of aggressive cuff conditioning, anti-inflammatories for the first 4 to 6 weeks. If this fails we will proceed with MRI and discussion of subacromial injection.

## 2023-12-11 NOTE — Progress Notes (Signed)
    Procedures performed today:    None.  Independent interpretation of notes and tests performed by another provider:   None.  Brief History, Exam, Impression, and Recommendations:    Impingement syndrome, shoulder, left Very pleasant 58 year old female, chronic left shoulder pain that she localizes mostly over the deltoid, worse with abduction. She has had an MRI in the past that showed some cuff tendinosis. She has had physical therapy predominantly focusing on her scapula but not so much the rotator cuff. She is hoping to avoid pharmacologic treatment but is amenable to try it. On exam she has a positive Hawkins test, she was very hesitant to allow me to do much more physical examination maneuvers as historically the maneuvers we do as providers worsens her pain significantly. We ended the physical examination after noting a positive Hawkins sign. I explained to her the anatomy and force coupling function of the rotator cuff, she understands the importance of aggressive cuff conditioning, anti-inflammatories for the first 4 to 6 weeks. If this fails we will proceed with MRI and discussion of subacromial injection.    ____________________________________________ Christine Hendrix, M.D., ABFM., CAQSM., AME. Primary Care and Sports Medicine [facility]  Adjunct Professor of [facility] Medicine  [employer]  Restaurant manager, fast food

## 2023-12-17 ENCOUNTER — Telehealth (INDEPENDENT_AMBULATORY_CARE_PROVIDER_SITE_OTHER): Admitting: Family Medicine

## 2023-12-17 DIAGNOSIS — Z79890 Hormone replacement therapy (postmenopausal): Secondary | ICD-10-CM

## 2023-12-17 DIAGNOSIS — G43711 Chronic migraine without aura, intractable, with status migrainosus: Secondary | ICD-10-CM | POA: Diagnosis not present

## 2023-12-17 DIAGNOSIS — J310 Chronic rhinitis: Secondary | ICD-10-CM | POA: Diagnosis not present

## 2023-12-17 MED ORDER — ESTROGENS CONJUGATED 1.25 MG PO TABS: 1.2500 mg | ORAL_TABLET | Freq: Every day | ORAL | 1 refills | Status: DC

## 2023-12-18 ENCOUNTER — Ambulatory Visit: Payer: Self-pay | Admitting: Sports Medicine

## 2023-12-20 ENCOUNTER — Encounter: Payer: Self-pay | Admitting: Family Medicine

## 2023-12-20 DIAGNOSIS — J310 Chronic rhinitis: Secondary | ICD-10-CM | POA: Insufficient documentation

## 2023-12-20 NOTE — Progress Notes (Signed)
 Christine Hendrix - 58 y.o. female MRN [id number]  Date of birth: 04-Jul-1965   All issues noted in this document were discussed and addressed.  No physical exam was performed (except for noted visual exam findings with Video Visits).  I discussed the limitations of evaluation and management by telemedicine and the availability of in person appointments. The patient expressed understanding and agreed to proceed.  I connected withNAME@ on 12/20/23 at  3:50 PM EDT by a video enabled telemedicine application and verified that I am speaking with the correct person using two identifiers.  Present at visit: Velma Ku, DO Randine Laity   Patient Location: Home [address] [place] [address]   Provider location:   [facility]  No chief complaint on file.   HPI  Christine Hendrix is a 59 y.o. female who presents via audio/video conferencing for a telehealth visit today.     She is requesting referral to new ENT.  She has been seeing ENT in [facility] but will prefer to transfer care back to Dr. Karis.  Dr. Karis did her previous sinus surgery.  She continues to have difficulty with continued rhinitis, congestion and dizziness.    She does have history of migraines as well and is followed by neurology.Multiple medications including medications for prevention as well as acute treatment.   ROS:  A comprehensive ROS was completed and negative except as noted per HPI  Past Medical History:  Diagnosis Date   Arthritis    neck   Atrial fibrillation (HCC)    Colitis    chronic    Colitis    Dental crowns present    also dental implant lower right   Family history of colon cancer    GERD (gastroesophageal reflux disease)    Hyperlipidemia    Hypothyroidism    Kidney stone on left side 03/27/2017   Migraines    MVP (mitral valve prolapse)    Nasal turbinate hypertrophy 04/2016   Nephrolithiasis    PONV (postoperative nausea and vomiting)    Psoriasis    TMJ syndrome    limited jaw opening     Past Surgical History:  Procedure Laterality Date   54 HOUR PH STUDY N/A 10/30/2021   Procedure: 24 HOUR PH STUDY;  Surgeon: San Sandor GAILS, DO;  Location: [facility];  Service: Gastroenterology;  Laterality: N/A;   ABDOMINAL HYSTERECTOMY     complete   BUNIONECTOMY WITH HAMMERTOE RECONSTRUCTION Left 03/04/2016   4th/5th toes   CHOLECYSTECTOMY     COLONOSCOPY     More than 5 years. [facility]. Jen[facility]    ESOPHAGEAL MANOMETRY N/A 10/30/2021   Procedure: ESOPHAGEAL MANOMETRY (EM);  Surgeon: San Sandor GAILS, DO;  Location: [facility];  Service: Gastroenterology;  Laterality: N/A;  ph impendence   ESOPHAGOGASTRODUODENOSCOPY     about 5 years ago. [facility] [employer]   TMJ ARTHROPLASTY     TRIGEMINAL NERVE DECOMPRESSION     TURBINATE REDUCTION Bilateral 05/23/2016   Procedure: BILATERAL TURBINATE REDUCTION;  Surgeon: Daniel Karis, MD;  Location: [facility];  Service: ENT;  Laterality: Bilateral;    Family History  Problem Relation Age of Onset   Heart disease Mother    CAD Mother    Heart disease Father    Heart failure Father    Colon cancer Father        dx early 42's    Liver disease Brother    Stomach cancer Brother  Colon cancer Brother    Liver cancer Brother    Esophageal cancer Neg Hx    Inflammatory bowel disease Neg Hx    Pancreatic cancer Neg Hx    Rectal cancer Neg Hx     Social History   Socioeconomic History   Marital status: Married    Spouse name: Not on file   Number of children: Not on file   Years of education: Not on file   Highest education level: Not on file  Occupational History   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    Comment: Child psychotherapist  Tobacco Use   Smoking status: Never   Smokeless tobacco: Never  Vaping Use   Vaping status: Never Used  Substance and Sexual Activity   Alcohol use: No    Alcohol/week: 0.0 standard drinks of alcohol   Drug use: No   Sexual activity: Not on file   Other Topics Concern   Not on file  Social History Narrative   Not on file   Social Drivers of Health   Financial Resource Strain: Low Risk  (10/28/2022)   Received from [facility]   Overall Financial Resource Strain (CARDIA)    Difficulty of Paying Living Expenses: Not hard at all  Food Insecurity: No Food Insecurity (10/28/2022)   Received from [facility]   Hunger Vital Sign    Within the past 12 months, you worried that your food would run out before you got the money to buy more.: Never true    Within the past 12 months, the food you bought just didn't last and you didn't have money to get more.: Never true  Transportation Needs: No Transportation Needs (10/28/2022)   Received from [facility] - Transportation    Lack of Transportation (Medical): No    Lack of Transportation (Non-Medical): No  Physical Activity: Insufficiently Active (02/12/2022)   Received from [facility]   Exercise Vital Sign    On average, how many days per week do you engage in moderate to strenuous exercise (like a brisk walk)?: 1 day    On average, how many minutes do you engage in exercise at this level?: 30 min  Stress: Stress Concern Present (02/12/2022)   Received from [facility] - Occupational Stress Questionnaire    Feeling of Stress : To some extent  Social Connections: Unknown (02/18/2023)   Received from [facility]   Social Network    Social Network: Not on file  Intimate Partner Violence: Unknown (02/18/2023)   Received from [facility]   HITS    Physically Hurt: Not on file    Insult or Talk Down To: Not on file    Threaten Physical Harm: Not on file    Scream or Curse: Not on file     Current Outpatient Medications:    AIMOVIG  140 MG/ML SOAJ, Inject 140 mg into the skin every 30 (thirty) days., Disp: 3 mL, Rfl: 1   albuterol  (VENTOLIN  HFA) 108 (90 Base) MCG/ACT inhaler, SMARTSIG:2 Puff(s) By Mouth, Disp: , Rfl:     aspirin  EC 81 MG tablet, Take 1 tablet (81 mg total) by mouth daily., Disp: 90 tablet, Rfl: 3   chlorhexidine (PERIDEX) 0.12 % solution, SMARTSIG:0.5 Capful(s) Every Night, Disp: , Rfl:    chlorzoxazone (PARAFON) 500 MG tablet, Take 500 mg by mouth 4 (four) times daily as needed for muscle spasms. (Patient not taking: Reported on 10/30/2023), Disp: , Rfl:  clobetasol  ointment (TEMOVATE ) 0.05 %, Apply topically 2 (two) times daily., Disp: , Rfl:    Clobetasol  Propionate (TEMOVATE ) 0.05 % external spray, Apply topically 2 (two) times daily. (Patient not taking: Reported on 10/30/2023), Disp: 125 mL, Rfl: 1   cyclobenzaprine  (FLEXERIL ) 10 MG tablet, Take 1 tablet (10 mg total) by mouth 3 (three) times daily as needed for muscle spasms. (Patient not taking: Reported on 10/30/2023), Disp: 30 tablet, Rfl: 0   diltiazem  (CARDIZEM  CD) 120 MG 24 hr capsule, Take 1 capsule (120 mg total) by mouth daily., Disp: 90 capsule, Rfl: 3   estradiol (ESTRACE) 0.1 MG/GM vaginal cream, , Disp: , Rfl:    estrogens , conjugated, (PREMARIN ) 1.25 MG tablet, Take 1 tablet (1.25 mg total) by mouth daily., Disp: 90 tablet, Rfl: 1   Ferrous Sulfate (IRON PO), Take by mouth daily., Disp: , Rfl:    gabapentin  (NEURONTIN ) 300 MG capsule, Take 1 capsule (300 mg total) by mouth at bedtime. (Patient not taking: Reported on 10/30/2023), Disp: 90 capsule, Rfl: 3   ketoconazole  (NIZORAL ) 2 % cream, Apply 1 Application topically daily. Use for two weeks (Patient not taking: Reported on 10/30/2023), Disp: 30 g, Rfl: 0   levothyroxine  (SYNTHROID ) 75 MCG tablet, TAKE 1 TABLET DAILY BEFORE BREAKFAST, Disp: 90 tablet, Rfl: 3   loteprednol (LOTEMAX) 0.5 % ophthalmic suspension, 1 drop daily. (Patient not taking: Reported on 09/04/2023), Disp: , Rfl:    meclizine  (ANTIVERT ) 25 MG tablet, Take 1 tablet (25 mg total) by mouth 3 (three) times daily as needed for dizziness., Disp: 30 tablet, Rfl: 0   meloxicam  (MOBIC ) 15 MG tablet, One tab PO every 24 hours  with a meal for 2 weeks, then once every 24 hours prn pain., Disp: 30 tablet, Rfl: 3   methotrexate (RHEUMATREX) 2.5 MG tablet, 4 tablets Orally once a week for 84 days (Patient not taking: Reported on 10/30/2023), Disp: , Rfl:    Multiple Vitamin (MULTIVITAMIN WITH MINERALS) TABS tablet, Take 1 tablet by mouth daily., Disp: , Rfl:    mupirocin  ointment (BACTROBAN ) 2 %, Apply 1 Application topically 2 (two) times daily. Apply inside nares up to 10 days (Patient not taking: Reported on 10/30/2023), Disp: 22 g, Rfl: 0   ondansetron  (ZOFRAN -ODT) 8 MG disintegrating tablet, Take 1 tablet (8 mg total) by mouth every 8 (eight) hours as needed for nausea or vomiting., Disp: 180 tablet, Rfl: 5   RABEprazole  (ACIPHEX ) 20 MG tablet, Take 1 tablet (20 mg total) by mouth 2 (two) times daily., Disp: 180 tablet, Rfl: 2   Rimegepant Sulfate (NURTEC) 75 MG TBDP, Take 75 mg by mouth daily as needed (migraine). (Patient not taking: Reported on 10/30/2023), Disp: 36 tablet, Rfl: 1   sucralfate  (CARAFATE ) 1 GM/10ML suspension, Take 10 mLs (1 g total) by mouth 2 (two) times daily. (Patient not taking: Reported on 10/30/2023), Disp: 420 mL, Rfl: 0   SUMAtriptan  (IMITREX ) 100 MG tablet, TAKE 1 TABLET EVERY 2 HOURS AS NEEDED FOR MIGRAINE, MAY REPEAT IN 2 HOURS IF HEADACHE PERSISTS OR RECURS (Patient not taking: Reported on 10/30/2023), Disp: 9 tablet, Rfl: 27   SUMAtriptan  6 MG/0.5ML SOAJ, SMARTSIG:1 auto-injector SUB-Q 1-2 Times Daily (Patient not taking: Reported on 10/30/2023), Disp: , Rfl:    tamsulosin  (FLOMAX ) 0.4 MG CAPS capsule, TAKE 1 CAPSULE (0.4 MG TOTAL) BY MOUTH DAILY AT BEDTIME (Patient not taking: Reported on 10/30/2023), Disp: 90 capsule, Rfl: 1   TYRVAYA 0.03 MG/ACT SOLN, Place 1 spray into both nostrils 2 (two) times daily. (  Patient not taking: Reported on 10/30/2023), Disp: , Rfl:    Vonoprazan Fumarate  (VOQUEZNA ) 10 MG TABS, Take 10 mg by mouth daily., Disp: 30 tablet, Rfl: 6   zonisamide  (ZONEGRAN ) 100 MG capsule, Take 1  capsule (100 mg total) by mouth daily. (Patient not taking: Reported on 10/30/2023), Disp: 90 capsule, Rfl: 2  EXAM:  VITALS per patient if applicable: There were no vitals taken for this visit.  GENERAL: alert, oriented, appears well and in no acute distress  HEENT: atraumatic, conjunttiva clear, no obvious abnormalities on inspection of external nose and ears  NECK: normal movements of the head and neck  LUNGS: on inspection no signs of respiratory distress, breathing rate appears normal, no obvious gross SOB, gasping or wheezing  CV: no obvious cyanosis  MS: moves all visible extremities without noticeable abnormality  PSYCH/NEURO: pleasant and cooperative, no obvious depression or anxiety, speech and thought processing grossly intact  ASSESSMENT AND PLAN:  Discussed the following assessment and plan:  Chronic rhinitis Requesting referral to ENT.  Would like to see Dr. Karis again.  Referral entered.   Postmenopausal HRT (hormone replacement therapy) Remains on Premarin .  This continues to work well for her at current strength.  Intractable chronic migraine without aura and with status migrainosus She has tried multiple medications with varying degrees of improvement.  We discussed seeing a different neurologist for second opinion which she will consider.  She can try Nurtec every other day for prevention.     I discussed the assessment and treatment plan with the patient. The patient was provided an opportunity to ask questions and all were answered. The patient agreed with the plan and demonstrated an understanding of the instructions.   The patient was advised to call back or seek an in-person evaluation if the symptoms worsen or if the condition fails to improve as anticipated.    Velma Ku, DO

## 2023-12-20 NOTE — Assessment & Plan Note (Signed)
 She has tried multiple medications with varying degrees of improvement.  We discussed seeing a different neurologist for second opinion which she will consider.  She can try Nurtec every other day for prevention.

## 2023-12-20 NOTE — Assessment & Plan Note (Signed)
 Remains on Premarin .  This continues to work well for her at current strength.

## 2023-12-20 NOTE — Assessment & Plan Note (Signed)
 Requesting referral to ENT.  Would like to see Dr. Karis again.  Referral entered.

## 2023-12-28 ENCOUNTER — Telehealth: Payer: Self-pay | Admitting: Podiatry

## 2023-12-28 NOTE — Telephone Encounter (Signed)
 Called to schedule appt and verify information

## 2024-01-04 ENCOUNTER — Ambulatory Visit: Admitting: Podiatry

## 2024-01-07 ENCOUNTER — Ambulatory Visit: Admitting: Podiatry

## 2024-01-16 ENCOUNTER — Encounter: Payer: Self-pay | Admitting: Family Medicine

## 2024-01-22 ENCOUNTER — Ambulatory Visit: Admitting: Sports Medicine

## 2024-01-26 ENCOUNTER — Encounter: Payer: Self-pay | Admitting: Sports Medicine

## 2024-02-03 ENCOUNTER — Ambulatory Visit: Admitting: Podiatry

## 2024-02-04 NOTE — Progress Notes (Addendum)
 Referring:  Alvia Bring, DO [address] [place],  [place] [address]  PCP: Alvia Bring, DO    CC:  headaches  History provided from self  HPI:   Consult visit 04/21/2022 Dr. Rush: Medical co-morbidities: afib  The patient presents for evaluation of headaches which began 22 years ago. Headaches are associated with photophobia, phonophobia, nausea, and vomiting. She cannot swallow pills or even water with severe migraines. Even the nasal spray is difficult because she cannot smell anything without vomiting. She does occasionally have starbursts in her vision and paresthesias in her face and hands associated with her migraines. Migraines can last for several hours at a time. Migraine frequency varies. Typically has migraines 3-4 times per week.  She is currently taking amitriptyline  25 mg QHS, zonisamide  75 mg daily, and Aimovig  140 mg monthly for migraine prevention. She does feel these medications have helped with her migraines. She does have concerns that she has gained weight with amitriptyline . For rescue she will take Imitrex  oral or nasal spray. This works inconsistently. Can only take this with her more mild migraines as she cannot tolerate any medications with her most severe migraines due to vomiting.    Update 02/05/2024 JM: Patient returns for follow-up visit after prior visit with Dr. Rush almost 2 years ago.  She wishes to reestablish care in our office.  She is [facility]  She is currently on Aimovig  which she has been on for years but feels her migraines have been gradually worsening.  She had 2 severe migraines over the past 3 weeks which kept her from going to work and had another severe migraine back in June which prompted ED evaluation and resolved after migraine cocktail. CTH at that time unremarkable.  She can have occasional migraine headaches during the day which are not as severe  but her most severe migraines are typically once that occur in the middle of the night, usually between 2am-7am, migraine severe by the time she wakes, she starts vomiting until she dry heaves and at times will go into A fib.  She does not have any symptoms prior to onset of migraine or any warning signs the night before migraine may occur.  She is using sumatriptan  injection but can have side effects and not always beneficial.  Migraine frequency can fluctuate from months to month, on a good month she may have 4-5 but on a bad month she can have 15-16. She can have occasional milder type headaches which are not debilitating.  She does endorse difficulty sleeping which she feels contributes to her migraines.  Reports completing sleep study in [place] about 2 years ago which was negative for sleep apnea (unable to view via epic).  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  She does wear a retainer at night.  She was previously on amitriptyline  which she felt helped with her sleep but was discontinued by Dr. Oneita to reduce amount of medications.  She is currently prescribed zonisamide  by Dr. Oneita but does not take.  She questions if this is needed.  Her last Aimovig  injection was 9/4, she questions change of therapy.  She has been offered TPI by Dr. Oneita but would prefer to avoid this.        Headache History: Onset: 22 years ago Triggers: weather changes, heat, often  wakes up with them Aura: starbursts in vision, paresthesias in face and hands Associated Symptoms:  Photophobia: yes  Phonophobia: yes  Nausea: yes Vomiting: yes Worse with activity?: yes Duration of headaches: several hours  Migraine days per month: varies - anywhere from 4-16 depending on month and triggers   Current Treatment: Abortive Imitrex  pill and nasal spray  Preventative Aimovig  140 mg monthly  Prior  Therapies                                 Amitriptyline  25 mg QHS Effexor Topamax Zonisamide  75 mg QHS Propranolol Metoprolol  Gabapentin  Emgality Aimovig  140 mg monthly  Rescue: Excedrin ibuprofen  Fioricet Nurtec 75 mg PRN - dizziness Imitrex  pill, subq, and nasal spray Maxalt Relpax Zomig  Migranal Naratriptan 2.5 mg PRN Zofran  8 mg PRN Reglan  Phenergan  Compazine suppository - diarrhea Chlorzoxazone 500 mg PRN   LABS: CBC    Component Value Date/Time   WBC 8.5 10/27/2023 0936   RBC 4.95 10/27/2023 0936   HGB 14.1 10/27/2023 0936   HGB 12.5 07/30/2023 1526   HCT 41.8 10/27/2023 0936   HCT 37.5 07/30/2023 1526   PLT 307 10/27/2023 0936   PLT 320 07/30/2023 1526   MCV 84.4 10/27/2023 0936   MCV 84 07/30/2023 1526   MCH 28.5 10/27/2023 0936   MCHC 33.7 10/27/2023 0936   RDW 13.4 10/27/2023 0936   RDW 13.4 07/30/2023 1526   LYMPHSABS 1.9 10/27/2023 0936   LYMPHSABS 2.5 07/30/2023 1526   MONOABS 0.6 10/27/2023 0936   EOSABS 0.1 10/27/2023 0936   EOSABS 0.1 07/30/2023 1526   BASOSABS 0.0 10/27/2023 0936   BASOSABS 0.0 07/30/2023 1526      Latest Ref Rng & Units 10/27/2023    9:36 AM 07/30/2023    3:26 PM 08/11/2022   10:43 AM  CMP  Glucose 70 - 99 mg/dL 877  90  87   BUN 6 - 20 mg/dL 11  11  14    Creatinine 0.44 - 1.00 mg/dL 9.25  9.09  9.08   Sodium 135 - 145 mmol/L 138  139  138   Potassium 3.5 - 5.1 mmol/L 3.2  3.6  3.5   Chloride 98 - 111 mmol/L 100  102  101   CO2 22 - 32 mmol/L 23  22  27    Calcium 8.9 - 10.3 mg/dL 9.1  9.1  9.1   Total Protein 6.0 - 8.5 g/dL  6.7    Total Bilirubin 0.0 - 1.2 mg/dL  <9.7    Alkaline Phos 44 - 121 IU/L  85    AST 0 - 40 IU/L  18    ALT 0 - 32 IU/L  14       IMAGING:  Patient reports she has previously had normal MRIs of her brain. Records are unavailable for review.  Current Outpatient Medications on File Prior to Visit  Medication Sig Dispense Refill   AIMOVIG  140 MG/ML SOAJ Inject 140 mg into the skin every  30 (thirty) days. 3 mL 1   chlorzoxazone (PARAFON) 500 MG tablet Take 500 mg by mouth 4 (four) times daily as needed for muscle spasms.     clobetasol  ointment (TEMOVATE ) 0.05 % Apply topically 2 (two) times daily.     diltiazem  (CARDIZEM  CD) 120 MG 24 hr capsule Take 1 capsule (120 mg total) by mouth daily. 90 capsule 3   estrogens , conjugated, (PREMARIN ) 1.25 MG tablet Take 1 tablet (  1.25 mg total) by mouth daily. 90 tablet 1   levothyroxine  (SYNTHROID ) 75 MCG tablet TAKE 1 TABLET DAILY BEFORE BREAKFAST 90 tablet 3   loteprednol (LOTEMAX) 0.5 % ophthalmic suspension 1 drop daily. (Patient taking differently: 1 drop daily. As needed)     meclizine  (ANTIVERT ) 25 MG tablet Take 1 tablet (25 mg total) by mouth 3 (three) times daily as needed for dizziness. 30 tablet 0   Multiple Vitamin (MULTIVITAMIN WITH MINERALS) TABS tablet Take 1 tablet by mouth daily.     ondansetron  (ZOFRAN -ODT) 8 MG disintegrating tablet Take 1 tablet (8 mg total) by mouth every 8 (eight) hours as needed for nausea or vomiting. 180 tablet 5   RABEprazole  (ACIPHEX ) 20 MG tablet Take 1 tablet (20 mg total) by mouth 2 (two) times daily. 180 tablet 2   Rimegepant Sulfate (NURTEC) 75 MG TBDP Take 75 mg by mouth daily as needed (migraine). 36 tablet 1   SUMAtriptan  (IMITREX ) 100 MG tablet TAKE 1 TABLET EVERY 2 HOURS AS NEEDED FOR MIGRAINE, MAY REPEAT IN 2 HOURS IF HEADACHE PERSISTS OR RECURS 9 tablet 27   SUMAtriptan  6 MG/0.5ML SOAJ SMARTSIG:1 auto-injector SUB-Q 1-2 Times Daily     albuterol  (VENTOLIN  HFA) 108 (90 Base) MCG/ACT inhaler SMARTSIG:2 Puff(s) By Mouth (Patient not taking: Reported on 02/05/2024)     aspirin  EC 81 MG tablet Take 1 tablet (81 mg total) by mouth daily. 90 tablet 3   chlorhexidine (PERIDEX) 0.12 % solution SMARTSIG:0.5 Capful(s) Every Night (Patient not taking: Reported on 02/05/2024)     Clobetasol  Propionate (TEMOVATE ) 0.05 % external spray Apply topically 2 (two) times daily. (Patient not taking: Reported  on 02/05/2024) 125 mL 1   cyclobenzaprine  (FLEXERIL ) 10 MG tablet Take 1 tablet (10 mg total) by mouth 3 (three) times daily as needed for muscle spasms. (Patient not taking: Reported on 02/05/2024) 30 tablet 0   estradiol (ESTRACE) 0.1 MG/GM vaginal cream  (Patient not taking: Reported on 02/05/2024)     Ferrous Sulfate (IRON PO) Take by mouth daily. (Patient not taking: Reported on 02/05/2024)     gabapentin  (NEURONTIN ) 300 MG capsule Take 1 capsule (300 mg total) by mouth at bedtime. (Patient not taking: Reported on 02/05/2024) 90 capsule 3   ketoconazole  (NIZORAL ) 2 % cream Apply 1 Application topically daily. Use for two weeks (Patient not taking: Reported on 02/05/2024) 30 g 0   meloxicam  (MOBIC ) 15 MG tablet One tab PO every 24 hours with a meal for 2 weeks, then once every 24 hours prn pain. (Patient not taking: Reported on 02/05/2024) 30 tablet 3   methotrexate (RHEUMATREX) 2.5 MG tablet 4 tablets Orally once a week for 84 days (Patient not taking: Reported on 02/05/2024)     mupirocin  ointment (BACTROBAN ) 2 % Apply 1 Application topically 2 (two) times daily. Apply inside nares up to 10 days (Patient not taking: Reported on 02/05/2024) 22 g 0   sucralfate  (CARAFATE ) 1 GM/10ML suspension Take 10 mLs (1 g total) by mouth 2 (two) times daily. (Patient not taking: Reported on 02/05/2024) 420 mL 0   tamsulosin  (FLOMAX ) 0.4 MG CAPS capsule TAKE 1 CAPSULE (0.4 MG TOTAL) BY MOUTH DAILY AT BEDTIME (Patient not taking: Reported on 02/05/2024) 90 capsule 1   TYRVAYA 0.03 MG/ACT SOLN Place 1 spray into both nostrils 2 (two) times daily. (Patient not taking: Reported on 02/05/2024)     Vonoprazan Fumarate  (VOQUEZNA ) 10 MG TABS Take 10 mg by mouth daily. (Patient not taking: Reported on 02/05/2024) 30 tablet  6   zonisamide  (ZONEGRAN ) 100 MG capsule Take 1 capsule (100 mg total) by mouth daily. (Patient not taking: Reported on 02/05/2024) 90 capsule 2   No current facility-administered medications on file prior to  visit.     Allergies: Allergies  Allergen Reactions   Codeine Nausea And Vomiting, Other (See Comments) and Rash    Vomiting and dizziness   Metoprolol  Rash   Solifenacin Other (See Comments)    BLURRED VISION Other reaction(s): Hallucinations Visual disturbances   Molds & Smuts Other (See Comments)    Family History: Family History  Problem Relation Age of Onset   Heart disease Mother    CAD Mother    Heart disease Father    Heart failure Father    Colon cancer Father        dx early 61's    Liver disease Brother    Stomach cancer Brother    Colon cancer Brother    Liver cancer Brother    Esophageal cancer Neg Hx    Inflammatory bowel disease Neg Hx    Pancreatic cancer Neg Hx    Rectal cancer Neg Hx      Past Medical History: Past Medical History:  Diagnosis Date   Arthritis    neck   Atrial fibrillation (HCC)    Colitis    chronic    Colitis    Dental crowns present    also dental implant lower right   Family history of colon cancer    GERD (gastroesophageal reflux disease)    Hyperlipidemia    Hypothyroidism    Kidney stone on left side 03/27/2017   Migraines    MVP (mitral valve prolapse)    Nasal turbinate hypertrophy 04/2016   Nephrolithiasis    PONV (postoperative nausea and vomiting)    Psoriasis    TMJ syndrome    limited jaw opening    Past Surgical History Past Surgical History:  Procedure Laterality Date   59 HOUR PH STUDY N/A 10/30/2021   Procedure: 24 HOUR PH STUDY;  Surgeon: San Sandor GAILS, DO;  Location: [facility];  Service: Gastroenterology;  Laterality: N/A;   ABDOMINAL HYSTERECTOMY     complete   BUNIONECTOMY WITH HAMMERTOE RECONSTRUCTION Left 03/04/2016   4th/5th toes   CHOLECYSTECTOMY     COLONOSCOPY     More than 5 years. [facility]. Jen[facility]    ESOPHAGEAL MANOMETRY N/A 10/30/2021   Procedure: ESOPHAGEAL MANOMETRY (EM);  Surgeon: San Sandor GAILS, DO;  Location: [facility];  Service:  Gastroenterology;  Laterality: N/A;  ph impendence   ESOPHAGOGASTRODUODENOSCOPY     about 5 years ago. [facility] [employer]   TMJ ARTHROPLASTY     TRIGEMINAL NERVE DECOMPRESSION     TURBINATE REDUCTION Bilateral 05/23/2016   Procedure: BILATERAL TURBINATE REDUCTION;  Surgeon: Daniel Moccasin, MD;  Location: [facility];  Service: ENT;  Laterality: Bilateral;    Social History: Social History   Tobacco Use   Smoking status: Never   Smokeless tobacco: Never  Vaping Use   Vaping status: Never Used  Substance Use Topics   Alcohol use: No    Alcohol/week: 0.0 standard drinks of alcohol   Drug use: No    ROS: Negative for fevers, chills. Positive for headaches. All other systems reviewed and negative unless stated otherwise in HPI.   Physical Exam:   Vital Signs: BP 131/83 (BP Location: Right Arm, Patient Position: Sitting, Cuff Size: Normal)   Pulse 70  Ht 5' 4 (1.626 m)   Wt 149 lb 12.8 oz (67.9 kg)   BMI 25.71 kg/m  GENERAL: well appearing, very pleasant middle-age Caucasian female, in no acute distress,alert  NEUROLOGICAL: Mental Status: Alert, oriented to person, place and time,Follows commands Cranial Nerves: visual fields intact to confrontation, extraocular movements intact, facial sensation intact, no facial droop or ptosis, hearing grossly intact, no dysarthria Motor: muscle strength 5/5 both upper and lower extremities,no drift, normal tone Reflexes: 2+ throughout Sensation: intact to light touch all 4 extremities Coordination: Finger-to- nose-finger intact bilaterally Gait: normal-based      IMPRESSION: 58 year old female with a history of afib who returns for follow-up for migraine headaches.  She has had migraine headaches over the past 20+ years.  She has been on Aimovig  for years and feels this previously controlled her migraines but has been experiencing increased frequency over the past several months.     PLAN: -Did discuss  switching Aimovig  to alternative CGRP such as Ajovy . She wil further consider and call if interested in switching. She was provided a sample.  -recommend restarting amitriptyline  25 mg nightly as poor sleep and insomnia can contribute to migraines during sleep and may also further help reduce migraine frequency and/or severity.  Previously concerned about weight gain possibly due to amitriptyline , advised to call if this should occur and can switch to nortriptyline  -Recommend trial of Zavzpret  for more severe migraines when she is unable to tolerate p.o. due to severe nausea. She has great difficulty tolerating PO rescue medications with severe migraines as this will worsen nausea and vomiting and unable to keep them down. Can continue PO medications for mild headaches but needs different route (either nasal or SQ) for severe migraines. Sample provided. Will call if beneficial for official prescription - Recommend trial of Nurtec for migraines during the day when able to tolerate p.o.. Sample provided.  Will call if beneficial for official prescription. -will start process to obtain Nerivio device for both migraine prevention and rescue -if not approved through insurance or cost too high, additional information provided for Cefaly -discussed possible contributing factors for migraines occurring during sleep.  Reports previously undergoing sleep study about 2 years ago which was negative for sleep apnea. She will contact the office where sleep study was performed to have them fax results to our office for review.  Also discussed possible bruxism contributing, longstanding history of TMJ and suspect possibly clenching during sleep.  She was encouraged to follow-up with her dentist to discuss possible benefit with Botox - ADDENDUM 03/03/2024: Received [facility] report from sleep study completed 11/2019 which showed no significant OSA with total AHI of 3.5/h and no significant oxygen  desaturation.  Can consider repeat sleep study if sleep concerns persist    Follow-up in 6 months or call earlier if needed      I personally spent a total of 80 minutes in the care of the patient today including preparing to see the patient, getting/reviewing separately obtained history, performing a medically appropriate exam/evaluation, counseling and educating, placing orders, and documenting clinical information in the EHR. This is our first time meeting and time has been spent reviewing past medical history and relevant medical records.   Harlene Bogaert, AGNP-BC  [facility] [address] [place], [place] [address]  Phone [phone] Fax [phone] Note: This document was prepared with digital dictation and possible smart phrase technology. Any transcriptional errors that result from this process are unintentional.

## 2024-02-05 ENCOUNTER — Ambulatory Visit: Admitting: Adult Health

## 2024-02-05 ENCOUNTER — Encounter: Payer: Self-pay | Admitting: Adult Health

## 2024-02-05 VITALS — BP 131/83 | HR 70 | Ht 64.0 in | Wt 149.8 lb

## 2024-02-05 DIAGNOSIS — F458 Other somatoform disorders: Secondary | ICD-10-CM | POA: Diagnosis not present

## 2024-02-05 DIAGNOSIS — M26629 Arthralgia of temporomandibular joint, unspecified side: Secondary | ICD-10-CM | POA: Diagnosis not present

## 2024-02-05 DIAGNOSIS — G43119 Migraine with aura, intractable, without status migrainosus: Secondary | ICD-10-CM | POA: Diagnosis not present

## 2024-02-05 MED ORDER — AMITRIPTYLINE HCL 25 MG PO TABS: 25.0000 mg | ORAL_TABLET | Freq: Every day | ORAL | 3 refills | Status: DC

## 2024-02-05 NOTE — Patient Instructions (Addendum)
 Consider switching to Ajovy monthly injection for further migraine rescue - if interested in doing this, please let me know  Restart amitriptyline  25 mg nightly to further help with migraine prevention as well as sleep benefit  Would recommend trying Zazpret to use as migraine rescue - if beneficial, will send in a prescription   Recommended trying Nurtec as needed during the day for migraine rescue   Would recommend you follow up with your dentist regarding possible bruxism (teeth grinding) which can possible trigger migraines during sleep   Will start process to see if insurance will cover Nerivio. You can also look into Cefaly device if unable to obtain Nerivio.   Please have prior sleep clinic send us  results from prior sleep study for review     Follow up in 6 months or call earlier if needed

## 2024-02-09 ENCOUNTER — Telehealth: Payer: Self-pay

## 2024-02-10 NOTE — Telephone Encounter (Signed)
 Nerivio order completed and signed. Faxed to [facility] and rec'd transmission confirmation.

## 2024-02-12 ENCOUNTER — Encounter (INDEPENDENT_AMBULATORY_CARE_PROVIDER_SITE_OTHER): Payer: Self-pay | Admitting: Otolaryngology

## 2024-02-12 ENCOUNTER — Ambulatory Visit (INDEPENDENT_AMBULATORY_CARE_PROVIDER_SITE_OTHER): Admitting: Otolaryngology

## 2024-02-12 VITALS — BP 143/85 | HR 68 | Temp 97.4°F

## 2024-02-12 DIAGNOSIS — R42 Dizziness and giddiness: Secondary | ICD-10-CM

## 2024-02-12 DIAGNOSIS — R0981 Nasal congestion: Secondary | ICD-10-CM

## 2024-02-12 DIAGNOSIS — H6980 Other specified disorders of Eustachian tube, unspecified ear: Secondary | ICD-10-CM | POA: Diagnosis not present

## 2024-02-12 DIAGNOSIS — J310 Chronic rhinitis: Secondary | ICD-10-CM

## 2024-02-12 DIAGNOSIS — H6981 Other specified disorders of Eustachian tube, right ear: Secondary | ICD-10-CM

## 2024-02-12 NOTE — Progress Notes (Signed)
 CC: Recurrent dizziness, chronic nasal congestion, nasal crusting  HPI:  Christine Hendrix is a 58 y.o. female who presents today complaining of chronic nasal congestion with nasal crusting.  In addition, she also complains of intermittent recurrent dizziness for the past 6 months.  The patient has a history of chronic rhinitis and bilateral inferior turbinate hypertrophy.  She underwent bilateral turbinate reduction surgery in December 2017.  According to the patient, she has been noticing increasing nasal congestion over the past year.  In addition, she has also noted frequent nasal crusting, especially on the right side.  Approximately 3 years ago, she had an episode of severe right epistaxis, requiring cauterization.  Since the procedure, she has noted frequent crusting of the right nasal septum.  She also complains of recurrent dizziness.  She describes the dizziness as an off-balance and lightheaded sensation.  She was treated with meclizine , Reglan , and Benadryl .  She denies any otalgia, otorrhea, or spinning vertigo.  She does complain of frequent popping sensation in her right ear.  Past Medical History:  Diagnosis Date   Arthritis    neck   Atrial fibrillation (HCC)    Colitis    chronic    Colitis    Dental crowns present    also dental implant lower right   Family history of colon cancer    GERD (gastroesophageal reflux disease)    Hyperlipidemia    Hypothyroidism    Kidney stone on left side 03/27/2017   Migraines    MVP (mitral valve prolapse)    Nasal turbinate hypertrophy 04/2016   Nephrolithiasis    PONV (postoperative nausea and vomiting)    Psoriasis    TMJ syndrome    limited jaw opening    Past Surgical History:  Procedure Laterality Date   78 HOUR PH STUDY N/A 10/30/2021   Procedure: 24 HOUR PH STUDY;  Surgeon: San Sandor GAILS, DO;  Location: [facility];  Service: Gastroenterology;  Laterality: N/A;   ABDOMINAL HYSTERECTOMY     complete   BUNIONECTOMY WITH  HAMMERTOE RECONSTRUCTION Left 03/04/2016   4th/5th toes   CHOLECYSTECTOMY     COLONOSCOPY     More than 5 years. [facility]. Jenel Hammersmith    ESOPHAGEAL MANOMETRY N/A 10/30/2021   Procedure: ESOPHAGEAL MANOMETRY (EM);  Surgeon: San Sandor GAILS, DO;  Location: [facility];  Service: Gastroenterology;  Laterality: N/A;  ph impendence   ESOPHAGOGASTRODUODENOSCOPY     about 5 years ago. [facility] [employer]   TMJ ARTHROPLASTY     TRIGEMINAL NERVE DECOMPRESSION     TURBINATE REDUCTION Bilateral 05/23/2016   Procedure: BILATERAL TURBINATE REDUCTION;  Surgeon: Daniel Moccasin, MD;  Location: [facility];  Service: ENT;  Laterality: Bilateral;    Family History  Problem Relation Age of Onset   Heart disease Mother    CAD Mother    Heart disease Father    Heart failure Father    Colon cancer Father        dx early 67's    Liver disease Brother    Stomach cancer Brother    Colon cancer Brother    Liver cancer Brother    Esophageal cancer Neg Hx    Inflammatory bowel disease Neg Hx    Pancreatic cancer Neg Hx    Rectal cancer Neg Hx     Social History:  reports that she has never smoked. She has never used smokeless tobacco. She reports that she does not drink alcohol and does  not use drugs.  Allergies:  Allergies  Allergen Reactions   Codeine Nausea And Vomiting, Other (See Comments) and Rash    Vomiting and dizziness   Metoprolol  Rash   Solifenacin Other (See Comments)    BLURRED VISION Other reaction(s): Hallucinations Visual disturbances   Molds & Smuts Other (See Comments)    Prior to Admission medications   Medication Sig Start Date End Date Taking? Authorizing Provider  AIMOVIG  140 MG/ML SOAJ Inject 140 mg into the skin every 30 (thirty) days. 09/02/23  Yes Alvia Bring, DO  albuterol  (VENTOLIN  HFA) 108 (90 Base) MCG/ACT inhaler SMARTSIG:2 Puff(s) By Mouth 09/16/23  Yes [provider]  amitriptyline  (ELAVIL ) 25 MG tablet Take 1 tablet  (25 mg total) by mouth at bedtime. 02/05/24  Yes Whitfield Raisin, NP  aspirin  EC 81 MG tablet Take 1 tablet (81 mg total) by mouth daily. 03/24/18  Yes Pietro Redell RAMAN, MD  chlorzoxazone (PARAFON) 500 MG tablet Take 500 mg by mouth 4 (four) times daily as needed for muscle spasms. 02/13/22  Yes [provider]  clobetasol  ointment (TEMOVATE ) 0.05 % Apply topically 2 (two) times daily. 08/10/23  Yes [provider]  diltiazem  (CARDIZEM  CD) 120 MG 24 hr capsule Take 1 capsule (120 mg total) by mouth daily. 09/22/18 02/12/24 Yes Pietro Redell RAMAN, MD  estrogens , conjugated, (PREMARIN ) 1.25 MG tablet Take 1 tablet (1.25 mg total) by mouth daily. 12/17/23  Yes Alvia Bring, DO  levothyroxine  (SYNTHROID ) 75 MCG tablet TAKE 1 TABLET DAILY BEFORE BREAKFAST 09/07/23  Yes Alvia Bring, DO  loteprednol (LOTEMAX) 0.5 % ophthalmic suspension 1 drop daily. Patient taking differently: 1 drop daily. As needed 12/08/22  Yes [provider]  meclizine  (ANTIVERT ) 25 MG tablet Take 1 tablet (25 mg total) by mouth 3 (three) times daily as needed for dizziness. 10/27/23  Yes Hildegard, Amjad, PA-C  Multiple Vitamin (MULTIVITAMIN WITH MINERALS) TABS tablet Take 1 tablet by mouth daily.   Yes [provider]  ondansetron  (ZOFRAN -ODT) 8 MG disintegrating tablet Take 1 tablet (8 mg total) by mouth every 8 (eight) hours as needed for nausea or vomiting. 09/07/23  Yes Alvia Bring, DO  RABEprazole  (ACIPHEX ) 20 MG tablet Take 1 tablet (20 mg total) by mouth 2 (two) times daily. 10/27/23  Yes Mansouraty, Aloha Raddle., MD  Rimegepant Sulfate (NURTEC) 75 MG TBDP Take 75 mg by mouth daily as needed (migraine). 05/23/22  Yes Jessup, Joy, NP  SUMAtriptan  (IMITREX ) 100 MG tablet TAKE 1 TABLET EVERY 2 HOURS AS NEEDED FOR MIGRAINE, MAY REPEAT IN 2 HOURS IF HEADACHE PERSISTS OR RECURS 08/19/21  Yes Alvia Bring, DO  SUMAtriptan  6 MG/0.5ML SOAJ SMARTSIG:1 auto-injector SUB-Q 1-2 Times Daily 10/15/23  Yes [provider]  ketoconazole  (NIZORAL ) 2 % cream Apply 1 Application topically daily. Use for two weeks Patient not taking: Reported on 02/12/2024 09/19/22   Pauline Garnette LABOR, MD  sucralfate  (CARAFATE ) 1 GM/10ML suspension Take 10 mLs (1 g total) by mouth 2 (two) times daily. Patient not taking: Reported on 02/12/2024 04/10/23   Mansouraty, Aloha Raddle., MD    Blood pressure (!) 143/85, pulse 68, temperature (!) 97.4 F (36.3 C), temperature source Oral, SpO2 97%. Exam: General: Communicates without difficulty, well nourished, no acute distress. Head: Normocephalic, no evidence injury, no tenderness, facial buttresses intact without stepoff. Face/sinus: No tenderness to palpation and percussion. Facial movement is normal and symmetric. Eyes: PERRL, EOMI. No scleral icterus, conjunctivae clear. Neuro: CN II exam reveals vision grossly intact.  No nystagmus  at any point of gaze. Ears: Auricles well formed without lesions.  Ear canals are intact without mass or lesion.  No erythema or edema is appreciated.  The TMs are intact without fluid. Nose: External evaluation reveals normal support and skin without lesions.  Dorsum is intact.  Anterior rhinoscopy reveals congested mucosa over anterior aspect of inferior turbinates and intact septum.  Crusting is noted on the right anterior nasal septum.  Oral:  Oral cavity and oropharynx are intact, symmetric, without erythema or edema.  Mucosa is moist without lesions. Neck: Full range of motion without pain.  There is no significant lymphadenopathy.  No masses palpable.  Thyroid  bed within normal limits to palpation.  Parotid glands and submandibular glands equal bilaterally without mass.  Trachea is midline. Neuro:  CN 2-12 grossly intact. Vestibular: No nystagmus at any point of gaze. Vestibular: There is no nystagmus with pneumatic pressure on either tympanic membrane or Valsalva. The cerebellar examination is unremarkable.   Procedure:  Flexible Nasal Endoscopy:  Description: Risks, benefits, and alternatives of flexible endoscopy were explained to the patient.  Specific mention was made of the risk of throat numbness with difficulty swallowing, possible bleeding from the nose and mouth, and pain from the procedure.  The patient gave oral consent to proceed.  The flexible scope was inserted into the right nasal cavity.  Endoscopy of the interior nasal cavity, superior, inferior, and middle meatus was performed. The sphenoid-ethmoid recess was examined. Edematous mucosa was noted.  No polyp, mass, or lesion was appreciated.  Crusting noted on the right anterior nasal septum.  Olfactory cleft was clear.  Nasopharynx was clear.  Turbinates were well-healed from her previous surgery.  The procedure was repeated on the contralateral side with similar findings.  The patient tolerated the procedure well.   Assessment: 1.  Chronic rhinitis with diffuse nasal mucosal congestion. 2.  Nasal crusting is noted on the right anterior nasal septum. 3.  Recurrent dizziness of unknown etiology. The possible differential diagnoses include transient BPPV, vestibular migraine, Meniere's disease, peripheral vestibular dysfunction, or other central/systemic causes.  4.  Clinical eustachian tube dysfunction.  Plan: 1.  The physical exam and nasal endoscopy findings are reviewed with the patient. 2.  Nasacort nasal spray 2 sprays each nostril daily.  The importance of consistent daily use is discussed. 3.  Nasal saline gel to treat the nasal crusting. 4.  The pathophysiology of vestibular dysfunction and dizziness are discussed with the patient. The possible differential diagnoses are reviewed. Questions are invited and answered.  5.  The patient will return for reevaluation with hearing test.  No audiologist is available today.  Staysha Truby W Renell Allum 02/12/2024, 11:20 AM

## 2024-02-14 ENCOUNTER — Encounter: Payer: Self-pay | Admitting: Adult Health

## 2024-02-14 DIAGNOSIS — G43119 Migraine with aura, intractable, without status migrainosus: Secondary | ICD-10-CM

## 2024-02-15 DIAGNOSIS — R42 Dizziness and giddiness: Secondary | ICD-10-CM | POA: Insufficient documentation

## 2024-02-15 DIAGNOSIS — L4050 Arthropathic psoriasis, unspecified: Secondary | ICD-10-CM | POA: Diagnosis not present

## 2024-02-15 DIAGNOSIS — M79671 Pain in right foot: Secondary | ICD-10-CM | POA: Diagnosis not present

## 2024-02-15 DIAGNOSIS — M1990 Unspecified osteoarthritis, unspecified site: Secondary | ICD-10-CM | POA: Diagnosis not present

## 2024-02-15 DIAGNOSIS — M542 Cervicalgia: Secondary | ICD-10-CM | POA: Diagnosis not present

## 2024-02-15 DIAGNOSIS — H6981 Other specified disorders of Eustachian tube, right ear: Secondary | ICD-10-CM | POA: Insufficient documentation

## 2024-02-15 MED ORDER — AMITRIPTYLINE HCL 10 MG PO TABS: 10.0000 mg | ORAL_TABLET | Freq: Every day | ORAL | 11 refills | Status: AC

## 2024-02-17 MED ORDER — AJOVY 225 MG/1.5ML ~~LOC~~ SOAJ: 225.0000 mg | SUBCUTANEOUS | 11 refills | Status: DC

## 2024-02-17 NOTE — Telephone Encounter (Signed)
 Can we try to get sleep study results for review? Thank you!

## 2024-02-17 NOTE — Addendum Note (Signed)
 Addended by: WHITFIELD RAISIN L on: 02/17/2024 04:33 PM   Modules accepted: Orders

## 2024-02-23 ENCOUNTER — Encounter: Payer: Self-pay | Admitting: Adult Health

## 2024-02-23 DIAGNOSIS — G43119 Migraine with aura, intractable, without status migrainosus: Secondary | ICD-10-CM

## 2024-02-24 ENCOUNTER — Telehealth: Payer: Self-pay

## 2024-02-24 ENCOUNTER — Other Ambulatory Visit (HOSPITAL_COMMUNITY): Payer: Self-pay

## 2024-02-24 MED ORDER — AJOVY 225 MG/1.5ML ~~LOC~~ SOAJ: 225.0000 mg | SUBCUTANEOUS | 3 refills | Status: AC

## 2024-02-24 NOTE — Telephone Encounter (Signed)
 Asked provider to send in new RX for 90DS.

## 2024-02-24 NOTE — Telephone Encounter (Signed)
New order placed as requested. Thank you

## 2024-02-24 NOTE — Telephone Encounter (Signed)
 Can you assist with this? I do not see where a PA was requested. Do I just need to place a new order for 90 day supply? Thank you!

## 2024-02-24 NOTE — Telephone Encounter (Signed)
 Pharmacy Patient Advocate Encounter  Received notification from [facility] [employer] that Prior Authorization for Ajovy  225mg /1.49ml autoinjector has been APPROVED from 02/24/2024 to 02/23/2025. Ran test claim, Copay is $0. This test claim was processed through [facility]- copay amounts may vary at other pharmacies due to pharmacy/plan contracts, or as the patient moves through the different stages of their insurance plan.   PA #/Case ID/Reference #: [id number]  Per insurance must be 4.54ml per 90DS in order for PT to get best copay. 28DS, 30DS or 84DS could cause higher copay for PT

## 2024-03-08 ENCOUNTER — Encounter: Payer: Self-pay | Admitting: Gastroenterology

## 2024-03-14 ENCOUNTER — Ambulatory Visit (INDEPENDENT_AMBULATORY_CARE_PROVIDER_SITE_OTHER)

## 2024-03-14 ENCOUNTER — Ambulatory Visit: Admitting: Podiatry

## 2024-03-14 DIAGNOSIS — M2042 Other hammer toe(s) (acquired), left foot: Secondary | ICD-10-CM | POA: Diagnosis not present

## 2024-03-16 NOTE — Progress Notes (Signed)
 Subjective:   Patient ID: Christine Hendrix, female   DOB: 58 y.o.   MRN: [id number]   HPI Patient presents stating that she is getting a lot of pain in the end of her second toe left foot and also that she still gets some nervelike discomfort from surgery that was done a number of years ago.  Patient does not smoke and likes to be active   Review of Systems  All other systems reviewed and are negative.       Objective:  Physical Exam Vitals and nursing note reviewed.  Constitutional:      Appearance: She is well-developed.  Pulmonary:     Effort: Pulmonary effort is normal.  Musculoskeletal:        General: Normal range of motion.  Skin:    General: Skin is warm.  Neurological:     Mental Status: She is alert.     Neurovascular status intact muscle strength adequate range of motion adequate with patient found to have quite a bit of discomfort in the distal portion of the second digit left with keratotic tissue formation and irritation of the digit with mild elongation of the toe.  Previous bunion surgery with excellent motion that could slightly shorten her big toe and create more pressure against the big second toe but it does appear to be more of its own separate problem.  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  I went ahead today and I discussed that with her educating her on deformity.  I then went ahead and I dispensed 2 different types of pads to lift the toe up and we can see what kind of relief this gives us  and hopefully avoid surgical procedure.  May be necessary depending on response  X-rays indicate there is distal contracture digit to left with pressure against the end of the bone structure with well-healed surgical site with fixation still in  place first metatarsal

## 2024-03-22 DIAGNOSIS — T7840XA Allergy, unspecified, initial encounter: Secondary | ICD-10-CM | POA: Diagnosis not present

## 2024-03-22 DIAGNOSIS — G629 Polyneuropathy, unspecified: Secondary | ICD-10-CM | POA: Diagnosis not present

## 2024-03-22 DIAGNOSIS — M5416 Radiculopathy, lumbar region: Secondary | ICD-10-CM | POA: Diagnosis not present

## 2024-03-22 DIAGNOSIS — M5417 Radiculopathy, lumbosacral region: Secondary | ICD-10-CM | POA: Diagnosis not present

## 2024-03-22 DIAGNOSIS — J9311 Primary spontaneous pneumothorax: Secondary | ICD-10-CM | POA: Diagnosis not present

## 2024-03-22 DIAGNOSIS — M5432 Sciatica, left side: Secondary | ICD-10-CM | POA: Diagnosis not present

## 2024-03-22 DIAGNOSIS — R448 Other symptoms and signs involving general sensations and perceptions: Secondary | ICD-10-CM | POA: Diagnosis not present

## 2024-03-22 DIAGNOSIS — Y999 Unspecified external cause status: Secondary | ICD-10-CM | POA: Diagnosis not present

## 2024-03-23 DIAGNOSIS — D1801 Hemangioma of skin and subcutaneous tissue: Secondary | ICD-10-CM | POA: Diagnosis not present

## 2024-03-23 DIAGNOSIS — L400 Psoriasis vulgaris: Secondary | ICD-10-CM | POA: Diagnosis not present

## 2024-03-23 DIAGNOSIS — B351 Tinea unguium: Secondary | ICD-10-CM | POA: Diagnosis not present

## 2024-03-25 ENCOUNTER — Ambulatory Visit: Payer: Self-pay

## 2024-03-25 NOTE — Telephone Encounter (Signed)
 The patient has been scheduled with a virtual appointment on 03/29/24 at 250 pm with the provider for the following symptoms.

## 2024-03-25 NOTE — Telephone Encounter (Signed)
 FYI Only or Action Required?: Action required by provider: clinical question for provider.  Patient was last seen in primary care on 12/17/2023 by Alvia Bring, DO.  Called Nurse Triage reporting Muscle Pain.  Symptoms began several days ago.  Interventions attempted: Rest, hydration, or home remedies.  Symptoms are: gradually worsening.  Triage Disposition: Call PCP When Office is Open (overriding Home Care)  Patient/caregiver understands and will follow disposition?: Yes                            Copied from CRM #[id number]. Topic: Clinical - Medical Advice >> Mar 25, 2024 12:06 PM Rachelle R wrote: Reason for CRM: Patient states the last time she had her labs done Dr Alvia advised her to start taking Iron. Patient wants to know if there is a set time frame to take or for how long. Also would like to know if she can get a re-test of her iron to see where she is at. States she took it for awhile but it has stopped as it seemed to hurt her stomach.  Also says over the last two nights has had 3 painful charlie horses and her restless legs has increased- now happens when she is awake. Any recommendations on what she can do?  Patient can be reached at [phone] Reason for Disposition  [1] Muscle aches or pain AND [2] present < 7 days  Answer Assessment - Initial Assessment Questions 1. ONSET: When did the muscle aches or body pains start?      Leg cramps started a few nights ago, restless legs sensation has been ongoing for over a year and is worsening  2. LOCATION: What part of your body is hurting? (e.g., entire body, arms, legs)      Leg cramps in shin area of bilateral legs Tingling and vibration (patient referred to as restless legs sensation) from lower back to toes- PCP has referred patient to neurology for this  3. SEVERITY: How bad is the pain? (Scale 1-10; or mild, moderate, severe)     Rates pain a 10 when leg cramps occur, leg cramps  have happened a total of 3 times and only at night 4. CAUSE: What do you think is causing the pains?     Unsure, patient is wondering if issues are related to low iron  5. FEVER: Do you have a fever? If Yes, ask: What is your temperature, how was it measured, and  when did it start?      Denies fever  6. OTHER SYMPTOMS: Do you have any other symptoms? (e.g., chest pain, cold or flu symptoms, rash, weakness, weight loss)     Low energy, states she has weakness at baseline due to arthritis, denies numbness  7. PREGNANCY: Is there any chance you are pregnant? When was your last menstrual period?     N/A    Patient originally called in to report new symptoms and wanted to ask if symptoms could be related to low iron. Patient stated she used to take iron supplements, but stopped due to GI upset. Patient would like provider's advise on what could be causing leg cramps and what she can take to help symptoms. Please advise. Patient would like a call back.  Protocols used: Muscle Aches and Body Pain-A-AH

## 2024-03-29 ENCOUNTER — Telehealth: Admitting: Family Medicine

## 2024-03-29 VITALS — Wt 149.0 lb

## 2024-03-29 DIAGNOSIS — G8929 Other chronic pain: Secondary | ICD-10-CM

## 2024-03-29 DIAGNOSIS — G2581 Restless legs syndrome: Secondary | ICD-10-CM | POA: Diagnosis not present

## 2024-03-29 DIAGNOSIS — M79604 Pain in right leg: Secondary | ICD-10-CM

## 2024-03-29 DIAGNOSIS — M542 Cervicalgia: Secondary | ICD-10-CM | POA: Diagnosis not present

## 2024-03-29 DIAGNOSIS — R7989 Other specified abnormal findings of blood chemistry: Secondary | ICD-10-CM

## 2024-03-29 DIAGNOSIS — G5793 Unspecified mononeuropathy of bilateral lower limbs: Secondary | ICD-10-CM

## 2024-03-29 DIAGNOSIS — M1990 Unspecified osteoarthritis, unspecified site: Secondary | ICD-10-CM | POA: Insufficient documentation

## 2024-03-29 DIAGNOSIS — M79605 Pain in left leg: Secondary | ICD-10-CM

## 2024-03-29 DIAGNOSIS — M51369 Other intervertebral disc degeneration, lumbar region without mention of lumbar back pain or lower extremity pain: Secondary | ICD-10-CM | POA: Insufficient documentation

## 2024-03-29 DIAGNOSIS — E611 Iron deficiency: Secondary | ICD-10-CM | POA: Diagnosis not present

## 2024-03-29 MED ORDER — GABAPENTIN 100 MG PO CAPS: 100.0000 mg | ORAL_CAPSULE | Freq: Three times a day (TID) | ORAL | 1 refills | Status: AC | PRN

## 2024-03-29 MED ORDER — ONDANSETRON 8 MG PO TBDP: 8.0000 mg | ORAL_TABLET | Freq: Three times a day (TID) | ORAL | 6 refills | Status: AC | PRN

## 2024-04-01 DIAGNOSIS — E611 Iron deficiency: Secondary | ICD-10-CM | POA: Diagnosis not present

## 2024-04-01 DIAGNOSIS — R7989 Other specified abnormal findings of blood chemistry: Secondary | ICD-10-CM | POA: Diagnosis not present

## 2024-04-02 LAB — CBC WITH DIFFERENTIAL/PLATELET
Basophils Absolute: 0.1 x10E3/uL (ref 0.0–0.2)
Basos: 1 %
EOS (ABSOLUTE): 0.2 x10E3/uL (ref 0.0–0.4)
Eos: 2 %
Hematocrit: 42.9 % (ref 34.0–46.6)
Hemoglobin: 13.5 g/dL (ref 11.1–15.9)
Immature Grans (Abs): 0 x10E3/uL (ref 0.0–0.1)
Immature Granulocytes: 0 %
Lymphocytes Absolute: 4.1 x10E3/uL — ABNORMAL HIGH (ref 0.7–3.1)
Lymphs: 43 %
MCH: 27.5 pg (ref 26.6–33.0)
MCHC: 31.5 g/dL (ref 31.5–35.7)
MCV: 87 fL (ref 79–97)
Monocytes Absolute: 1 x10E3/uL — ABNORMAL HIGH (ref 0.1–0.9)
Monocytes: 11 %
Neutrophils Absolute: 3.9 x10E3/uL (ref 1.4–7.0)
Neutrophils: 43 %
Platelets: 334 x10E3/uL (ref 150–450)
RBC: 4.91 x10E6/uL (ref 3.77–5.28)
RDW: 12.8 % (ref 11.7–15.4)
WBC: 9.3 x10E3/uL (ref 3.4–10.8)

## 2024-04-02 LAB — IRON,TIBC AND FERRITIN PANEL
Ferritin: 34 ng/mL (ref 15–150)
Iron Saturation: 20 % (ref 15–55)
Iron: 87 ug/dL (ref 27–159)
Total Iron Binding Capacity: 432 ug/dL (ref 250–450)
UIBC: 345 ug/dL (ref 131–425)

## 2024-04-02 LAB — TSH+T4F+T3FREE+THYABS+TPO+VD25
Free T4: 1.47 ng/dL (ref 0.82–1.77)
T3, Free: 3.2 pg/mL (ref 2.0–4.4)
TSH: 1.13 u[IU]/mL (ref 0.450–4.500)
Thyroglobulin Antibody: <1 [IU]/mL (ref 0.0–0.9)
Thyroperoxidase Ab SerPl-aCnc: 25 [IU]/mL (ref 0–34)
Vit D, 25-Hydroxy: 45.6 ng/mL (ref 30.0–100.0)

## 2024-04-03 ENCOUNTER — Encounter: Payer: Self-pay | Admitting: Family Medicine

## 2024-04-03 DIAGNOSIS — E611 Iron deficiency: Secondary | ICD-10-CM | POA: Insufficient documentation

## 2024-04-03 DIAGNOSIS — R7989 Other specified abnormal findings of blood chemistry: Secondary | ICD-10-CM | POA: Insufficient documentation

## 2024-04-03 NOTE — Assessment & Plan Note (Signed)
 Rechecking iron panel.

## 2024-04-03 NOTE — Progress Notes (Signed)
 Christine Hendrix - 58 y.o. female MRN [id number]  Date of birth: Jun 10, 1965   All issues noted in this document were discussed and addressed.  No physical exam was performed (except for noted visual exam findings with Video Visits).  I discussed the limitations of evaluation and management by telemedicine and the availability of in person appointments. The patient expressed understanding and agreed to proceed.  I connected withNAME@ on 04/03/24 at  2:50 PM EST by a video enabled telemedicine application and verified that I am speaking with the correct person using two identifiers.  Present at visit: Velma Ku, DO Randine Laity   Patient Location: Home [address] [place] [address]   Provider location:   [facility]  Chief Complaint  Patient presents with   Spasms    HPI  Christine Hendrix is a 58 y.o. female who presents via audio/video conferencing for a telehealth visit today.  She continues to have episodic low back pain as well as is pain in tight feeling in her lower extremities.  MRI of the lumbar spine and relatively unremarkable.  She had nerve conduction study and was told this was unremarkable as well.  Reports the pain can be severe at times.  Denies significant weakness.  She did also have an increase in her migraines as well as as some radiation of pain into the upper extremities.  She is seeing neurology for her migraines.   ROS:  A comprehensive ROS was completed and negative except as noted per HPI  Past Medical History:  Diagnosis Date   Arthritis    neck   Atrial fibrillation (HCC)    Colitis    chronic    Colitis    Dental crowns present    also dental implant lower right   Family history of colon cancer    GERD (gastroesophageal reflux disease)    Hyperlipidemia    Hypothyroidism    Kidney stone on left side 03/27/2017   Migraines    MVP (mitral valve prolapse)    Nasal turbinate hypertrophy 04/2016   Nephrolithiasis    PONV (postoperative nausea and  vomiting)    Psoriasis    TMJ syndrome    limited jaw opening    Past Surgical History:  Procedure Laterality Date   53 HOUR PH STUDY N/A 10/30/2021   Procedure: 24 HOUR PH STUDY;  Surgeon: San Sandor GAILS, DO;  Location: [facility];  Service: Gastroenterology;  Laterality: N/A;   ABDOMINAL HYSTERECTOMY     complete   BUNIONECTOMY WITH HAMMERTOE RECONSTRUCTION Left 03/04/2016   4th/5th toes   CHOLECYSTECTOMY     COLONOSCOPY     More than 5 years. [facility]    ESOPHAGEAL MANOMETRY N/A 10/30/2021   Procedure: ESOPHAGEAL MANOMETRY (EM);  Surgeon: San Sandor GAILS, DO;  Location: [facility];  Service: Gastroenterology;  Laterality: N/A;  ph impendence   ESOPHAGOGASTRODUODENOSCOPY     about 5 years ago. [facility] [employer]   TMJ ARTHROPLASTY     TRIGEMINAL NERVE DECOMPRESSION     TURBINATE REDUCTION Bilateral 05/23/2016   Procedure: BILATERAL TURBINATE REDUCTION;  Surgeon: Daniel Moccasin, MD;  Location: [facility];  Service: ENT;  Laterality: Bilateral;    Family History  Problem Relation Age of Onset   Heart disease Mother    CAD Mother    Heart disease Father    Heart failure Father    Colon cancer Father        dx  early 89's    Liver disease Brother    Stomach cancer Brother    Colon cancer Brother    Liver cancer Brother    Esophageal cancer Neg Hx    Inflammatory bowel disease Neg Hx    Pancreatic cancer Neg Hx    Rectal cancer Neg Hx     Social History   Socioeconomic History   Marital status: Married    Spouse name: Not on file   Number of children: Not on file   Years of education: Not on file   Highest education level: Not on file  Occupational History   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    Comment: Child Psychotherapist  Tobacco Use   Smoking status: Never   Smokeless tobacco: Never  Vaping Use   Vaping status: Never Used  Substance and Sexual Activity   Alcohol use: No    Alcohol/week: 0.0 standard drinks  of alcohol   Drug use: No   Sexual activity: Not on file  Other Topics Concern   Not on file  Social History Narrative   Not on file   Social Drivers of Health   Financial Resource Strain: Low Risk  (10/28/2022)   Received from [facility]   Overall Financial Resource Strain (CARDIA)    Difficulty of Paying Living Expenses: Not hard at all  Food Insecurity: No Food Insecurity (10/28/2022)   Received from [facility]   Hunger Vital Sign    Within the past 12 months, you worried that your food would run out before you got the money to buy more.: Never true    Within the past 12 months, the food you bought just didn't last and you didn't have money to get more.: Never true  Transportation Needs: No Transportation Needs (10/28/2022)   Received from [facility] - Transportation    Lack of Transportation (Medical): No    Lack of Transportation (Non-Medical): No  Physical Activity: Insufficiently Active (02/12/2022)   [facility] from [facility]   Exercise Vital Sign    On average, how many days per week do you engage in moderate to strenuous exercise (like a brisk walk)?: 1 day    On average, how many minutes do you engage in exercise at this level?: 30 min  Stress: Stress Concern Present (02/12/2022)   Received from [facility] - Occupational Stress Questionnaire    Feeling of Stress : To some extent  Social Connections: Unknown (02/18/2023)   Received from [facility]   Social Network    Social Network: Not on file  Intimate Partner Violence: Unknown (02/18/2023)   Received from [facility]   HITS    Physically Hurt: Not on file    Insult or Talk Down To: Not on file    Threaten Physical Harm: Not on file    Scream or Curse: Not on file     Current Outpatient Medications:    Apremilast (OTEZLA) 30 MG TABS, 1 tablet Orally Twice a day; Duration: 30 days, Disp: , Rfl:    CEQUA 0.09 % SOLN, Apply 1 drop to eye 2 (two)  times daily., Disp: , Rfl:    Difluprednate 0.05 % EMUL, SMARTSIG:1 Drop(s) In Eye(s) Twice Daily PRN, Disp: , Rfl:    gabapentin  (NEURONTIN ) 100 MG capsule, Take 1 capsule (100 mg total) by mouth 3 (three) times daily as needed., Disp: 270 capsule, Rfl: 1   Nerve Stimulator (NERIVIO) DEVI, USE WITHIN 1  HOUR OF MIGRAINE. SET STRONG COMFORTABLE INTENSITY LEVEL MAINTAIN LEVEL FOR 45 MINUTES, Disp: , Rfl:    albuterol  (VENTOLIN  HFA) 108 (90 Base) MCG/ACT inhaler, SMARTSIG:2 Puff(s) By Mouth, Disp: , Rfl:    amitriptyline  (ELAVIL ) 10 MG tablet, Take 1 tablet (10 mg total) by mouth at bedtime., Disp: 30 tablet, Rfl: 11   aspirin  EC 81 MG tablet, Take 1 tablet (81 mg total) by mouth daily., Disp: 90 tablet, Rfl: 3   chlorzoxazone (PARAFON) 500 MG tablet, Take 500 mg by mouth 4 (four) times daily as needed for muscle spasms., Disp: , Rfl:    clobetasol  ointment (TEMOVATE ) 0.05 %, Apply topically 2 (two) times daily., Disp: , Rfl:    diltiazem  (CARDIZEM  CD) 120 MG 24 hr capsule, Take 1 capsule (120 mg total) by mouth daily., Disp: 90 capsule, Rfl: 3   estrogens , conjugated, (PREMARIN ) 1.25 MG tablet, Take 1 tablet (1.25 mg total) by mouth daily., Disp: 90 tablet, Rfl: 1   Fremanezumab -vfrm (AJOVY ) 225 MG/1.5ML SOAJ, Inject 225 mg into the skin every 30 (thirty) days., Disp: 4.5 mL, Rfl: 3   ketoconazole  (NIZORAL ) 2 % cream, Apply 1 Application topically daily. Use for two weeks (Patient not taking: Reported on 02/12/2024), Disp: 30 g, Rfl: 0   levothyroxine  (SYNTHROID ) 75 MCG tablet, TAKE 1 TABLET DAILY BEFORE BREAKFAST, Disp: 90 tablet, Rfl: 3   loteprednol (LOTEMAX) 0.5 % ophthalmic suspension, 1 drop daily. (Patient taking differently: 1 drop daily. As needed), Disp: , Rfl:    meclizine  (ANTIVERT ) 25 MG tablet, Take 1 tablet (25 mg total) by mouth 3 (three) times daily as needed for dizziness., Disp: 30 tablet, Rfl: 0   Multiple Vitamin (MULTIVITAMIN WITH MINERALS) TABS tablet, Take 1 tablet by mouth  daily., Disp: , Rfl:    ondansetron  (ZOFRAN -ODT) 8 MG disintegrating tablet, Take 1 tablet (8 mg total) by mouth every 8 (eight) hours as needed for nausea or vomiting., Disp: 180 tablet, Rfl: 6   RABEprazole  (ACIPHEX ) 20 MG tablet, Take 1 tablet (20 mg total) by mouth 2 (two) times daily., Disp: 180 tablet, Rfl: 2   Rimegepant Sulfate (NURTEC) 75 MG TBDP, Take 75 mg by mouth daily as needed (migraine)., Disp: 36 tablet, Rfl: 1   sucralfate  (CARAFATE ) 1 GM/10ML suspension, Take 10 mLs (1 g total) by mouth 2 (two) times daily. (Patient not taking: Reported on 02/12/2024), Disp: 420 mL, Rfl: 0   SUMAtriptan  (IMITREX ) 100 MG tablet, TAKE 1 TABLET EVERY 2 HOURS AS NEEDED FOR MIGRAINE, MAY REPEAT IN 2 HOURS IF HEADACHE PERSISTS OR RECURS, Disp: 9 tablet, Rfl: 27   SUMAtriptan  6 MG/0.5ML SOAJ, SMARTSIG:1 auto-injector SUB-Q 1-2 Times Daily, Disp: , Rfl:   EXAM:  VITALS per patient if applicable: Wt 149 lb (67.6 kg)   BMI 25.58 kg/m   GENERAL: alert, oriented, appears well and in no acute distress  HEENT: atraumatic, conjunttiva clear, no obvious abnormalities on inspection of external nose and ears  NECK: normal movements of the head and neck  LUNGS: on inspection no signs of respiratory distress, breathing rate appears normal, no obvious gross SOB, gasping or wheezing  CV: no obvious cyanosis  MS: moves all visible extremities without noticeable abnormality  PSYCH/NEURO: pleasant and cooperative, no obvious depression or anxiety, speech and thought processing grossly intact  ASSESSMENT AND PLAN:  Discussed the following assessment and plan:  Restless legs syndrome Continues to have bilateral leg pain with restless feeling.  Suspect neuropathy.  No significant motor symptoms that she has noticed.  Unable to tolerate gabapentin  previously will try adding this back on at a lower strength.  Nerve conduction study as ell as MRI of the lumbar spine are normal.  Will evaluate for any lesions of  the cervical and thoracic spine given no source of symptoms on lumbar spine imaging.  Abnormal TSH Update TSH  Iron deficiency Rechecking iron panel.     I discussed the assessment and treatment plan with the patient. The patient was provided an opportunity to ask questions and all were answered. The patient agreed with the plan and demonstrated an understanding of the instructions.   The patient was advised to call back or seek an in-person evaluation if the symptoms worsen or if the condition fails to improve as anticipated.    Velma Ku, DO

## 2024-04-03 NOTE — Assessment & Plan Note (Addendum)
 Continues to have bilateral leg pain with restless feeling.  Suspect neuropathy.  No significant motor symptoms that she has noticed.  Unable to tolerate gabapentin  previously will try adding this back on at a lower strength.  Nerve conduction study as ell as MRI of the lumbar spine are normal.  Will evaluate for any lesions of the cervical and thoracic spine given no source of symptoms on lumbar spine imaging.

## 2024-04-03 NOTE — Assessment & Plan Note (Signed)
 Update TSH

## 2024-04-11 ENCOUNTER — Ambulatory Visit (INDEPENDENT_AMBULATORY_CARE_PROVIDER_SITE_OTHER)

## 2024-04-11 ENCOUNTER — Ambulatory Visit

## 2024-04-11 DIAGNOSIS — G8929 Other chronic pain: Secondary | ICD-10-CM | POA: Diagnosis not present

## 2024-04-11 DIAGNOSIS — M546 Pain in thoracic spine: Secondary | ICD-10-CM

## 2024-04-11 DIAGNOSIS — M542 Cervicalgia: Secondary | ICD-10-CM

## 2024-04-11 DIAGNOSIS — G5793 Unspecified mononeuropathy of bilateral lower limbs: Secondary | ICD-10-CM

## 2024-04-11 MED ORDER — GADOBUTROL 1 MMOL/ML IV SOLN
7.0000 mL | Freq: Once | INTRAVENOUS | Status: AC | PRN
  Administered 2024-04-11: 7 mL via INTRAVENOUS

## 2024-04-12 ENCOUNTER — Ambulatory Visit (INDEPENDENT_AMBULATORY_CARE_PROVIDER_SITE_OTHER): Admitting: Audiology

## 2024-04-12 ENCOUNTER — Ambulatory Visit (INDEPENDENT_AMBULATORY_CARE_PROVIDER_SITE_OTHER): Admitting: Otolaryngology

## 2024-04-13 ENCOUNTER — Ambulatory Visit: Payer: Self-pay | Admitting: Family Medicine

## 2024-04-13 DIAGNOSIS — M542 Cervicalgia: Secondary | ICD-10-CM

## 2024-04-14 ENCOUNTER — Telehealth: Payer: Self-pay | Admitting: Podiatry

## 2024-04-14 NOTE — Telephone Encounter (Signed)
 Pt request copy of xrays  Called to let him know ready to pick up 04/15/24 and 5.00 fee

## 2024-04-14 NOTE — Telephone Encounter (Signed)
 Patient has requested copies of her medical records and X-rays. She was informed of the following:  1.She can access her visit summaries through MyChart. 2.She will need to complete a medical release form to have records sent from [facility] Records to whom she has authorized. A copy of the form was emailed to the email address on file. 3.A request for her X-rays on a CD will be submitted to the appropriate agent. The patient was informed of the $5 cost due at the time of pick-up. Processing may take 1-2 weeks, keeping in mind the upcoming holiday next week when the office will be closed for 2 days. Once the CD is ready, she will be called to pick it up.

## 2024-04-14 NOTE — Telephone Encounter (Signed)
 lft mess on vmail for pt to adv CD is ready for pick at [place] on Friday 04/15/24 8a-230p and the fee is 5.00 for CD burn

## 2024-04-15 NOTE — Telephone Encounter (Signed)
 Thank you for the update!

## 2024-04-18 DIAGNOSIS — M79671 Pain in right foot: Secondary | ICD-10-CM | POA: Diagnosis not present

## 2024-04-18 DIAGNOSIS — M542 Cervicalgia: Secondary | ICD-10-CM | POA: Diagnosis not present

## 2024-04-18 DIAGNOSIS — L4050 Arthropathic psoriasis, unspecified: Secondary | ICD-10-CM | POA: Diagnosis not present

## 2024-04-18 DIAGNOSIS — M1990 Unspecified osteoarthritis, unspecified site: Secondary | ICD-10-CM | POA: Diagnosis not present

## 2024-04-24 ENCOUNTER — Ambulatory Visit: Admission: EM | Admit: 2024-04-24 | Discharge: 2024-04-24 | Disposition: A | Discharge status: Home / Self Care

## 2024-04-24 ENCOUNTER — Other Ambulatory Visit: Payer: Self-pay

## 2024-04-24 DIAGNOSIS — R0981 Nasal congestion: Secondary | ICD-10-CM | POA: Diagnosis not present

## 2024-04-24 DIAGNOSIS — R053 Chronic cough: Secondary | ICD-10-CM

## 2024-04-24 MED ORDER — BENZONATATE 100 MG PO CAPS: 100.0000 mg | ORAL_CAPSULE | Freq: Three times a day (TID) | ORAL | 0 refills | Status: AC | PRN

## 2024-04-24 NOTE — Discharge Instructions (Signed)
 Continue antibiotic.  I have prescribed a cough medication to take as needed.  Follow-up with your primary care doctor if symptoms persist or worsen.

## 2024-04-24 NOTE — ED Provider Notes (Signed)
 [facility]    CSN: [id number] Arrival date & time: 04/24/24  1542      History   Chief Complaint Chief Complaint  Patient presents with   Cough    HPI Christine Hendrix is a 58 y.o. female.   Patient presents with 2-week history of cough.  Reports that it feels like she needs to cough something up but she is not producing any phlegm.  Also reports nasal congestion.  Denies fever.  Has been taking Mucinex, Sudafed, nasal spray.  She had a televisit yesterday and was prescribed Augmentin which she has already started taking.  Denies history of asthma or COPD.   Cough   Past Medical History:  Diagnosis Date   Arthritis    neck   Atrial fibrillation (HCC)    Colitis    chronic    Colitis    Dental crowns present    also dental implant lower right   Family history of colon cancer    GERD (gastroesophageal reflux disease)    Hyperlipidemia    Hypothyroidism    Kidney stone on left side 03/27/2017   Migraines    MVP (mitral valve prolapse)    Nasal turbinate hypertrophy 04/2016   Nephrolithiasis    PONV (postoperative nausea and vomiting)    Psoriasis    TMJ syndrome    limited jaw opening    Patient Active Problem List   Diagnosis Date Noted   Abnormal TSH 04/03/2024   Iron deficiency 04/03/2024   Osteoarthritis 03/29/2024   Degeneration of lumbar intervertebral disc 03/29/2024   Dizziness 02/15/2024   Other specified disorders of eustachian tube, right ear 02/15/2024   Chronic rhinitis 12/20/2023   Impingement syndrome, shoulder, left 12/11/2023   Esophageal hypersensitivity 09/08/2023   Gastroesophageal reflux disease without esophagitis 09/08/2023   Bilious vomiting with nausea 09/08/2023   Early satiety 09/08/2023   Other fatigue 08/02/2023   Shortness of breath 08/02/2023   Renal stones 08/02/2023   Psoriatic arthritis (HCC) 12/30/2022   Constipation 06/05/2022   Abdominal pain, epigastric 06/05/2022   Arthralgia 05/19/2022   Prediabetes  05/12/2022   Dysphagia    Pyrosis    Nonerosive esophageal reflux disease    Psoriasis 04/28/2021   Left lumbar radiculopathy 03/17/2021   Abnormal weight gain 03/03/2021   Nasal septum ulceration 03/03/2021   Surgical menopause on hormone replacement therapy 10/21/2019   Right lower quadrant abdominal pain 04/18/2019   Proteinuria 02/21/2019   History of abnormal cervical Pap smear 01/14/2019   Snoring 05/13/2018   PAF (paroxysmal atrial fibrillation) (HCC) 02/26/2018   H/O: hysterectomy 12/04/2017   Women's annual routine gynecological examination 12/04/2017   Family history of colon cancer in father 09/18/2017   Postmenopausal HRT (hormone replacement therapy) 10/24/2016   Allergic rhinitis 10/08/2016   Adrenal mass, left 05/15/2015   Mixed incontinence 05/06/2015   Metatarsal deformity 08/25/2014   Acquired hammer toes of both feet 08/25/2014   Intractable chronic migraine without aura and with status migrainosus 02/24/2014   Hypothyroidism 09/02/2013   Neck pain 09/02/2013   Restless legs syndrome 09/02/2013   Insomnia 09/02/2013   Nonrheumatic mitral valve prolapse 05/30/2011    Past Surgical History:  Procedure Laterality Date   24 HOUR PH STUDY N/A 10/30/2021   Procedure: 24 HOUR PH STUDY;  Surgeon: San Sandor GAILS, DO;  Location: [facility];  Service: Gastroenterology;  Laterality: N/A;   ABDOMINAL HYSTERECTOMY     complete   BUNIONECTOMY WITH HAMMERTOE RECONSTRUCTION Left 03/04/2016  4th/5th toes   CHOLECYSTECTOMY     COLONOSCOPY     More than 5 years. [facility]. [place]    ESOPHAGEAL MANOMETRY N/A 10/30/2021   Procedure: ESOPHAGEAL MANOMETRY (EM);  Surgeon: San Sandor GAILS, DO;  Location: [facility];  Service: Gastroenterology;  Laterality: N/A;  ph impendence   ESOPHAGOGASTRODUODENOSCOPY     about 5 years ago. [facility] [employer]   TMJ ARTHROPLASTY     TRIGEMINAL NERVE DECOMPRESSION     TURBINATE REDUCTION Bilateral  05/23/2016   Procedure: BILATERAL TURBINATE REDUCTION;  Surgeon: Daniel Moccasin, MD;  Location: [facility];  Service: ENT;  Laterality: Bilateral;    OB History   No obstetric history on file.      Home Medications    Prior to Admission medications   Medication Sig Start Date End Date Taking? Authorizing Provider  amoxicillin -clavulanate (AUGMENTIN) 875-125 MG tablet Take 1 tablet by mouth 2 (two) times daily.   Yes [provider]  benzonatate (TESSALON) 100 MG capsule Take 1 capsule (100 mg total) by mouth every 8 (eight) hours as needed for cough. 04/24/24  Yes Azadeh Hyder, Darryle BRAVO, FNP  albuterol  (VENTOLIN  HFA) 108 [phone] Base) MCG/ACT inhaler SMARTSIG:2 Puff(s) By Mouth 09/16/23   [provider]  amitriptyline  (ELAVIL ) 10 MG tablet Take 1 tablet (10 mg total) by mouth at bedtime. 02/15/24   Whitfield Raisin, NP  Apremilast (OTEZLA) 30 MG TABS 1 tablet Orally Twice a day; Duration: 30 days 02/26/24   [provider]  aspirin  EC 81 MG tablet Take 1 tablet (81 mg total) by mouth daily. 03/24/18   Pietro Redell RAMAN, MD  CEQUA 0.09 % SOLN Apply 1 drop to eye 2 (two) times daily. 03/08/24   [provider]  chlorzoxazone (PARAFON) 500 MG tablet Take 500 mg by mouth 4 (four) times daily as needed for muscle spasms. 02/13/22   [provider]  clobetasol  ointment (TEMOVATE ) 0.05 % Apply topically 2 (two) times daily. 08/10/23   [provider]  Difluprednate 0.05 % EMUL SMARTSIG:1 Drop(s) In Eye(s) Twice Daily PRN 03/04/24   [provider]  diltiazem  (CARDIZEM  CD) 120 MG 24 hr capsule Take 1 capsule (120 mg total) by mouth daily. 09/22/18 02/12/24  Pietro Redell RAMAN, MD  estrogens , conjugated, (PREMARIN ) 1.25 MG tablet Take 1 tablet (1.25 mg total) by mouth daily. 12/17/23   Alvia Bring, DO  Fremanezumab -vfrm (AJOVY ) 225 MG/1.5ML SOAJ Inject 225 mg into the skin every 30 (thirty) days. 02/24/24   Whitfield Raisin, NP  gabapentin   (NEURONTIN ) 100 MG capsule Take 1 capsule (100 mg total) by mouth 3 (three) times daily as needed. 03/29/24   Alvia Bring, DO  ketoconazole  (NIZORAL ) 2 % cream Apply 1 Application topically daily. Use for two weeks Patient not taking: Reported on 02/12/2024 09/19/22   Pauline Garnette LABOR, MD  levothyroxine  (SYNTHROID ) 75 MCG tablet TAKE 1 TABLET DAILY BEFORE BREAKFAST 09/07/23   Alvia Bring, DO  loteprednol (LOTEMAX) 0.5 % ophthalmic suspension 1 drop daily. Patient taking differently: 1 drop daily. As needed 12/08/22   [provider]  meclizine  (ANTIVERT ) 25 MG tablet Take 1 tablet (25 mg total) by mouth 3 (three) times daily as needed for dizziness. 10/27/23   Hildegard Loge, PA-C  Multiple Vitamin (MULTIVITAMIN WITH MINERALS) TABS tablet Take 1 tablet by mouth daily.    [provider]  Nerve Stimulator (NERIVIO) DEVI USE WITHIN 1 HOUR OF MIGRAINE. SET STRONG COMFORTABLE INTENSITY LEVEL MAINTAIN LEVEL FOR  45 MINUTES 02/24/24   [provider]  ondansetron  (ZOFRAN -ODT) 8 MG disintegrating tablet Take 1 tablet (8 mg total) by mouth every 8 (eight) hours as needed for nausea or vomiting. 03/29/24   Alvia Bring, DO  RABEprazole  (ACIPHEX ) 20 MG tablet Take 1 tablet (20 mg total) by mouth 2 (two) times daily. 10/27/23   Mansouraty, Aloha Raddle., MD  Rimegepant Sulfate (NURTEC) 75 MG TBDP Take 75 mg by mouth daily as needed (migraine). 05/23/22   Willo Mini, NP  sucralfate  (CARAFATE ) 1 GM/10ML suspension Take 10 mLs (1 g total) by mouth 2 (two) times daily. Patient not taking: Reported on 02/12/2024 04/10/23   Mansouraty, Aloha Raddle., MD  SUMAtriptan  (IMITREX ) 100 MG tablet TAKE 1 TABLET EVERY 2 HOURS AS NEEDED FOR MIGRAINE, MAY REPEAT IN 2 HOURS IF HEADACHE PERSISTS OR RECURS 08/19/21   Alvia Bring, DO  SUMAtriptan  6 MG/0.5ML SOAJ SMARTSIG:1 auto-injector SUB-Q 1-2 Times Daily 10/15/23   [provider]    Family History Family History  Problem Relation Age of Onset    Heart disease Mother    CAD Mother    Heart disease Father    Heart failure Father    Colon cancer Father        dx early 10's    Liver disease Brother    Stomach cancer Brother    Colon cancer Brother    Liver cancer Brother    Esophageal cancer Neg Hx    Inflammatory bowel disease Neg Hx    Pancreatic cancer Neg Hx    Rectal cancer Neg Hx     Social History Social History   Tobacco Use   Smoking status: Never   Smokeless tobacco: Never  Vaping Use   Vaping status: Never Used  Substance Use Topics   Alcohol use: No    Alcohol/week: 0.0 standard drinks of alcohol   Drug use: No     Allergies   Codeine, Metoprolol , Solifenacin, and Molds & smuts   Review of Systems Review of Systems Per HPI  Physical Exam Triage Vital Signs ED Triage Vitals  Encounter Vitals Group     BP 04/24/24 1549 (!) 168/91     Girls Systolic BP Percentile --      Girls Diastolic BP Percentile --      Boys Systolic BP Percentile --      Boys Diastolic BP Percentile --      Pulse Rate 04/24/24 1549 83     Resp 04/24/24 1549 16     Temp 04/24/24 1549 (!) 97.4 F (36.3 C)     Temp src --      SpO2 04/24/24 1549 97 %     Weight --      Height --      Head Circumference --      Peak Flow --      Pain Score 04/24/24 1552 2     Pain Loc --      Pain Education --      Exclude from Growth Chart --    No data found.  Updated Vital Signs BP (!) 143/90   Pulse 83   Temp (!) 97.4 F (36.3 C)   Resp 16   SpO2 97%   Visual Acuity Right Eye Distance:   Left Eye Distance:   Bilateral Distance:    Right Eye Near:   Left Eye Near:    Bilateral Near:     Physical Exam Constitutional:      General: She  is not in acute distress.    Appearance: Normal appearance. She is not toxic-appearing or diaphoretic.  HENT:     Head: Normocephalic and atraumatic.     Right Ear: Tympanic membrane and ear canal normal.     Left Ear: Tympanic membrane and ear canal normal.     Nose: Congestion  present.     Mouth/Throat:     Mouth: Mucous membranes are moist.     Pharynx: No posterior oropharyngeal erythema.  Eyes:     Extraocular Movements: Extraocular movements intact.     Conjunctiva/sclera: Conjunctivae normal.     Pupils: Pupils are equal, round, and reactive to light.  Cardiovascular:     Rate and Rhythm: Normal rate and regular rhythm.     Pulses: Normal pulses.     Heart sounds: Normal heart sounds.  Pulmonary:     Effort: Pulmonary effort is normal. No respiratory distress.     Breath sounds: Normal breath sounds. No stridor. No wheezing, rhonchi or rales.  Musculoskeletal:        General: Normal range of motion.     Cervical back: Normal range of motion.  Skin:    General: Skin is warm and dry.  Neurological:     General: No focal deficit present.     Mental Status: She is alert and oriented to person, place, and time. Mental status is at baseline.  Psychiatric:        Mood and Affect: Mood normal.        Behavior: Behavior normal.      UC Treatments / Results  Labs (all labs ordered are listed, but only abnormal results are displayed) Labs Reviewed - No data to display  EKG   Radiology No results found.  Procedures Procedures (including critical care time)  Medications Ordered in UC Medications - No data to display  Initial Impression / Assessment and Plan / UC Course  I have reviewed the triage vital signs and the nursing notes.  Pertinent labs & imaging results that were available during my care of the patient were reviewed by me and considered in my medical decision making (see chart for details).     Symptoms most likely started off as viral but given duration of symptoms, there is concern for secondary bacterial infection. Therefore, antibiotic treatment is warranted. She had televisit who prescribed Augmentin that she started taking yesterday, therefore recommended for patient to continue this medication. Will add benzonatate for cough.  There are no adventitious lung sounds on exam, oxygen is normal, and there is no tachypnea so do not think that chest imaging is necessary. Viral testing deferred given duration of symptoms as it would not change treatment. Blood pressure elevated today but recheck was improved which is reassuring. Suspect OTC medication are contributing to increased BP, and patient also appears anxious about current symptoms which could be contributing. Patient advised to monitor BP at home and follow up with PCP if it remains elevated. Advised patient to follow up if symptoms persist or worsen. Patient verbalized understanding and was agreeable with plan.  Final Clinical Impressions(s) / UC Diagnoses   Final diagnoses:  Nasal congestion  Persistent cough     Discharge Instructions      Continue antibiotic.  I have prescribed a cough medication to take as needed.  Follow-up with your primary care doctor if symptoms persist or worsen.    ED Prescriptions     Medication Sig Dispense Auth. Provider   benzonatate (TESSALON) 100 MG  capsule Take 1 capsule (100 mg total) by mouth every 8 (eight) hours as needed for cough. 21 capsule Olinda, Matie Dimaano E, [place]      PDMP not reviewed this encounter.   Hazen Darryle BRAVO, [place] 04/25/24 [id number]

## 2024-04-24 NOTE — ED Triage Notes (Addendum)
 X 2 weeks has had cough. Has yellow phlegm that comes up sometimes. Reports chest is sore. No fever. Has been taking mucinex and sudafed, nasocort. Had televisit yesterday. Was given augmentin.

## 2024-04-25 ENCOUNTER — Telehealth: Payer: Self-pay | Admitting: Adult Health

## 2024-04-25 NOTE — Telephone Encounter (Signed)
 Referral placed to neurosurgery for cervicalgia

## 2024-04-25 NOTE — Telephone Encounter (Signed)
 Pt has called asking if she can be seen earlier than current appointment.  Pt states her migraines are worsening, she feels miserable. The tingling from her toes to back is increasing and the pain is higher than her mid back now.  Phone rep informed pt she would route message to RN for a call back.

## 2024-04-25 NOTE — Telephone Encounter (Signed)
 RN contacted pt for follow-up call. No additional concerns or complaints voiced. Pt stated that she is complaint with prescribed medications.

## 2024-04-25 NOTE — Telephone Encounter (Signed)
 Pt last seen by Harlene M,NP 02/05/24. Next f/u 08/18/24. Looks like she was seen yesterday by urgent care ongoing nasal congestion/cough. On augmentin/benzonatate.   I called pt at [phone]. Tingling has been going on for months, this is not new. Migraines have worsened.  She did not start on Ajovy  yet. She was started on Otezla two months ago by provider that treats her Arthritis and did not want to start on two new meds at same time. She plans to start on Ajovy  this month. Recommended she take monthly x3 months to allow time to see if medication is beneficial. She forgot about Nerivio device, she will go ahead and start using this also. Confirmed she is taking amitripyline 25mg  po at bedtime. She has not tried Zavzpret  yet. Encouraged her to try this. She has tried Nurtec but it makes her dizzy. Only takes for less severe migraines. Also has Imitrex  to take prn.  She also will see Ortho (referred by Arthritic provider)  She asked if Jessica received SS results. Relayed Harlene wrote in last note: - ADDENDUM 03/03/2024: Received [facility] report from sleep study completed 11/2019 which showed no significant OSA with total AHI of 3.5/h and no significant oxygen desaturation.  Can consider repeat sleep study if sleep concerns persist  She will call back if symptoms continue to worsen even after starting Ajovy  and Nerivio device.    Had MRI thoracic/cervical 04/11/24 that was ordered by PCP. Read her mychart below from PCP. She will reply back and ask for him to place referral to Neurosurgeon.       Per last note:  PLAN: -Did discuss switching Aimovig  to alternative CGRP such as Ajovy . She wil further consider and call if interested in switching. She was provided a sample.  -recommend restarting amitriptyline  25 mg nightly as poor sleep and insomnia can contribute to migraines during sleep and may also further help reduce migraine frequency and/or severity.  Previously  concerned about weight gain possibly due to amitriptyline , advised to call if this should occur and can switch to nortriptyline  -Recommend trial of Zavzpret  for more severe migraines when she is unable to tolerate p.o. Sample provided. Will call if beneficial for official prescription - Recommend trial of Nurtec for migraines during the day when able to tolerate p.o.. Sample provided.  Will call if beneficial for official prescription. -will start process to obtain Nerivio device for both migraine prevention and rescue -if not approved through insurance or cost too high, additional information provided for Cefaly -discussed possible contributing factors for migraines occurring during sleep.  Reports previously undergoing sleep study about 2 years ago which was negative for sleep apnea. She will contact the office where sleep study was performed to have them fax results to our office for review.  Also discussed possible bruxism contributing, longstanding history of TMJ and suspect possibly clenching during sleep.  She was encouraged to follow-up with her dentist to discuss possible benefit with Botox - ADDENDUM 03/03/2024: Received [facility] report from sleep study completed 11/2019 which showed no significant OSA with total AHI of 3.5/h and no significant oxygen desaturation.  Can consider repeat sleep study if sleep concerns persist

## 2024-04-26 ENCOUNTER — Ambulatory Visit: Admitting: Physician Assistant

## 2024-04-26 NOTE — Telephone Encounter (Signed)
 Noted. Agree with your recommendations. Thank you!

## 2024-05-04 NOTE — Telephone Encounter (Signed)
 Pt called stating that  she is doing well with Samples and would like for MD  to place order for  medication Zavzpret   to her Pharmacy . Pt stated  she is not sure if she can get 90 day supply if so [facility] . Pt also stated that is MD can not  give 90 day supply to  [facility] [place]

## 2024-05-04 NOTE — Addendum Note (Signed)
 Addended by: HILLIARD HEATHER CROME on: 05/04/2024 05:37 PM   Modules accepted: Orders

## 2024-05-05 DIAGNOSIS — M7752 Other enthesopathy of left foot: Secondary | ICD-10-CM | POA: Diagnosis not present

## 2024-05-05 DIAGNOSIS — M2042 Other hammer toe(s) (acquired), left foot: Secondary | ICD-10-CM | POA: Diagnosis not present

## 2024-05-05 DIAGNOSIS — G5792 Unspecified mononeuropathy of left lower limb: Secondary | ICD-10-CM | POA: Diagnosis not present

## 2024-05-05 MED ORDER — ZAVZPRET 10 MG/ACT NA SOLN: 1.0000 | NASAL | 11 refills | Status: DC | PRN

## 2024-05-05 NOTE — Telephone Encounter (Signed)
 Order placed for Zavzpret    Previously tried/failed: Rescue: Excedrin ibuprofen  Fioricet Nurtec 75 mg PRN - dizziness Imitrex  pill, subq, and nasal spray Maxalt Relpax Zomig  Migranal Naratriptan 2.5 mg PRN Zofran  8 mg PRN Reglan  Phenergan  Compazine suppository - diarrhea Chlorzoxazone 500 mg PRN

## 2024-05-05 NOTE — Addendum Note (Signed)
 Addended by: WHITFIELD RAISIN L on: 05/05/2024 10:10 AM   Modules accepted: Orders

## 2024-05-06 ENCOUNTER — Other Ambulatory Visit (HOSPITAL_COMMUNITY): Payer: Self-pay

## 2024-05-06 ENCOUNTER — Telehealth: Payer: Self-pay

## 2024-05-06 NOTE — Telephone Encounter (Signed)
 Hello Prescriber!  We are in the process of submitting a prior authorization for your patient for Zavzpret . We are reaching out for clinical guidance for the following information to complete the request: The patient's plan requires documentation showing that patient to has tried and failed, or have contraindication to the following agents before they will approve our request: Treximet or Symbravo. Or please advise if you would like to switch prescribed medication per insurance requirement.   Thank you! Pharmacy Team

## 2024-05-06 NOTE — Telephone Encounter (Signed)
 Please advise on medication alternative. Patient has not tried or failed Treximet or Symbravo. She has been on Zavzpret  since 2023

## 2024-05-09 DIAGNOSIS — M546 Pain in thoracic spine: Secondary | ICD-10-CM | POA: Diagnosis not present

## 2024-05-09 DIAGNOSIS — M5412 Radiculopathy, cervical region: Secondary | ICD-10-CM | POA: Diagnosis not present

## 2024-05-09 DIAGNOSIS — M5416 Radiculopathy, lumbar region: Secondary | ICD-10-CM | POA: Diagnosis not present

## 2024-05-09 NOTE — Telephone Encounter (Signed)
 Please advise patient of this. Would recommend trying Symbravo is patient agrees. This is a combination of both rizatriptan and meloxicam , please ensure no current use of additional NSAID or prior intolerance to NSAIDs. If she wants to try this, please let me know and I will place order. Thank you.

## 2024-05-10 ENCOUNTER — Other Ambulatory Visit (HOSPITAL_COMMUNITY): Payer: Self-pay

## 2024-05-10 NOTE — Telephone Encounter (Signed)
 I will need to submit the PA-and get the denial and then we can proceed with the appeal. I will go ahead and submit and see if we get an approval or denial. I included documentation that PT cannot swallow pills during a migraine attack. Thanks.  Pharmacy Patient Advocate Encounter   Received notification from Physician's Office that prior authorization for Zavzpret  is required/requested.   Insurance verification completed.   The patient is insured through [employer].   Per test claim: PA required; PA submitted to above mentioned insurance via Latent Key/confirmation #/EOC [id number] Status is pending

## 2024-05-10 NOTE — Telephone Encounter (Signed)
 Do you think we can try to appeal insurance requirements due to significant nausea with vomiting during migraines making it difficult for patient to tolerate PO rescue medications?

## 2024-05-10 NOTE — Telephone Encounter (Signed)
 Placed addendum in prior OV note noting inability to take PO form rescue medications with more severe migraines due to significant nausea and vomiting requiring her to use either nasal spray or SQ for severe migraine rescue. Can PA be updated to reflect this?

## 2024-05-10 NOTE — Telephone Encounter (Signed)
 Pharmacy Patient Advocate Encounter  Received notification from [facility] that Prior Authorization for Zavzpret  has been APPROVED from 05/10/2024 to 05/10/2025. Ran test claim, Copay is $0. This test claim was processed through [facility]- copay amounts may vary at other pharmacies due to pharmacy/plan contracts, or as the patient moves through the different stages of their insurance plan.   PA #/Case ID/Reference #: [id number]

## 2024-05-11 ENCOUNTER — Telehealth: Payer: Self-pay

## 2024-05-11 MED ORDER — ZAVZPRET 10 MG/ACT NA SOLN: 1.0000 | NASAL | 3 refills | Status: AC | PRN

## 2024-05-11 NOTE — Addendum Note (Signed)
 Addended by: WHITFIELD RAISIN L on: 05/11/2024 01:18 PM   Modules accepted: Orders

## 2024-05-11 NOTE — Telephone Encounter (Signed)
 [name]  calling to request to speak to Nurse about clarification on Pt Medication Zavzpret  ,  [name] Just up before I can get callback number

## 2024-05-11 NOTE — Telephone Encounter (Signed)
 [facility] only [facility] 90 day [facility].   Are you ok to do Zavzpret  90 day supply?  Another prescription will be needed if so.  (Pt aware- asking)

## 2024-05-11 NOTE — Telephone Encounter (Signed)
 Pharmacy sent over a covered alternative request for the Zavzpret  nasal spray that was sent in 05/05/24.  Options given: rizatriptan benzoate tablets or sumatriptan  nasal spray please advise

## 2024-05-11 NOTE — Telephone Encounter (Signed)
 Insurance already approved. Please see other mychart/telephone encounter.

## 2024-05-18 ENCOUNTER — Other Ambulatory Visit: Payer: Self-pay | Admitting: Family Medicine

## 2024-05-18 NOTE — Telephone Encounter (Unsigned)
 Copied from CRM [id number]. Topic: Clinical - Medication Refill >> May 18, 2024  8:22 AM Myrick T wrote: Patient would like a 90 day supply  Medication: estrogens , conjugated, (PREMARIN ) 1.25 MG tablet  Has the patient contacted their pharmacy? No  This is the patient's preferred pharmacy:   [id number] [place] [place] - [address] [place] [address] Phone: [phone] Fax: [phone]  Is this the correct pharmacy for this prescription? Yes  Has the prescription been filled recently? Yes  Is the patient out of the medication? No  Has the patient been seen for an appointment in the last year OR does the patient have an upcoming appointment? Yes  Can we respond through MyChart? No  Agent: Please be advised that Rx refills may take up to 3 business days. We ask that you follow-up with your pharmacy.

## 2024-05-20 MED ORDER — ESTROGENS CONJUGATED 1.25 MG PO TABS: 1.2500 mg | ORAL_TABLET | Freq: Every day | ORAL | 1 refills | Status: AC

## 2024-05-23 DIAGNOSIS — M545 Low back pain, unspecified: Secondary | ICD-10-CM | POA: Diagnosis not present

## 2024-06-20 ENCOUNTER — Ambulatory Visit: Admitting: Physician Assistant

## 2024-06-20 ENCOUNTER — Telehealth: Payer: Self-pay | Admitting: Family Medicine

## 2024-06-20 NOTE — Telephone Encounter (Signed)
 Panya, please see note below regarding patients labs.

## 2024-06-20 NOTE — Telephone Encounter (Signed)
 Copied from CRM #[id number]. Topic: General - Billing Inquiry >> Jun 20, 2024 10:07 AM Delon DASEN wrote: Reason for CRM: Eulalio with [facility]-  lab for Vitamin D  was not coded correctly, need it coded E83.52 and rebilled-  [phone]

## 2024-08-18 ENCOUNTER — Telehealth: Admitting: Adult Health

## 2024-09-15 ENCOUNTER — Ambulatory Visit: Admitting: Diagnostic Neuroimaging
